# Patient Record
Sex: Female | Born: 1945
Health system: Southern US, Community
[De-identification: ages and names within clinical notes are randomized; demographics above are authoritative.]

## PROBLEM LIST (undated history)

## (undated) DIAGNOSIS — I499 Cardiac arrhythmia, unspecified: Secondary | ICD-10-CM

## (undated) DIAGNOSIS — Z9289 Personal history of other medical treatment: Secondary | ICD-10-CM

## (undated) DIAGNOSIS — I10 Essential (primary) hypertension: Secondary | ICD-10-CM

## (undated) DIAGNOSIS — Z95810 Presence of automatic (implantable) cardiac defibrillator: Secondary | ICD-10-CM

## (undated) DIAGNOSIS — I779 Disorder of arteries and arterioles, unspecified: Secondary | ICD-10-CM

## (undated) DIAGNOSIS — I219 Acute myocardial infarction, unspecified: Secondary | ICD-10-CM

## (undated) DIAGNOSIS — I2510 Atherosclerotic heart disease of native coronary artery without angina pectoris: Secondary | ICD-10-CM

## (undated) DIAGNOSIS — E7800 Pure hypercholesterolemia, unspecified: Secondary | ICD-10-CM

## (undated) DIAGNOSIS — I739 Peripheral vascular disease, unspecified: Secondary | ICD-10-CM

## (undated) DIAGNOSIS — Z720 Tobacco use: Secondary | ICD-10-CM

## (undated) HISTORY — DX: Peripheral vascular disease, unspecified: I73.9

## (undated) HISTORY — DX: Disorder of arteries and arterioles, unspecified: I77.9

## (undated) HISTORY — DX: Presence of automatic (implantable) cardiac defibrillator: Z95.810

## (undated) HISTORY — PX: PERCUTANEOUS CORONARY STENT INTERVENTION (PCI-S): SHX6016

## (undated) HISTORY — DX: Tobacco use: Z72.0

## (undated) HISTORY — DX: Personal history of other medical treatment: Z92.89

## (undated) SURGERY — COLONOSCOPY
Anesthesia: Moderate Sedation

---

## 2008-06-27 HISTORY — PX: ENDARTERECTOMY: SHX5162

## 2008-06-27 HISTORY — PX: ANGIOPLASTY / STENTING FEMORAL: SUR30

## 2009-06-27 DIAGNOSIS — Z95810 Presence of automatic (implantable) cardiac defibrillator: Secondary | ICD-10-CM

## 2009-06-27 HISTORY — DX: Presence of automatic (implantable) cardiac defibrillator: Z95.810

## 2010-04-27 HISTORY — PX: CARDIAC DEFIBRILLATOR PLACEMENT: SHX171

## 2012-04-02 DIAGNOSIS — I2510 Atherosclerotic heart disease of native coronary artery without angina pectoris: Secondary | ICD-10-CM | POA: Diagnosis not present

## 2012-04-02 DIAGNOSIS — I10 Essential (primary) hypertension: Secondary | ICD-10-CM | POA: Diagnosis not present

## 2012-04-02 DIAGNOSIS — E559 Vitamin D deficiency, unspecified: Secondary | ICD-10-CM | POA: Diagnosis not present

## 2012-04-02 DIAGNOSIS — E7800 Pure hypercholesterolemia, unspecified: Secondary | ICD-10-CM | POA: Diagnosis not present

## 2012-04-10 ENCOUNTER — Other Ambulatory Visit (HOSPITAL_COMMUNITY): Payer: Self-pay | Admitting: Internal Medicine

## 2012-04-10 DIAGNOSIS — Z1231 Encounter for screening mammogram for malignant neoplasm of breast: Secondary | ICD-10-CM

## 2012-04-11 DIAGNOSIS — I739 Peripheral vascular disease, unspecified: Secondary | ICD-10-CM | POA: Diagnosis not present

## 2012-04-11 DIAGNOSIS — I2510 Atherosclerotic heart disease of native coronary artery without angina pectoris: Secondary | ICD-10-CM | POA: Diagnosis not present

## 2012-04-11 DIAGNOSIS — I6529 Occlusion and stenosis of unspecified carotid artery: Secondary | ICD-10-CM | POA: Diagnosis not present

## 2012-04-17 ENCOUNTER — Other Ambulatory Visit: Payer: Self-pay | Admitting: Gastroenterology

## 2012-04-17 DIAGNOSIS — Z1211 Encounter for screening for malignant neoplasm of colon: Secondary | ICD-10-CM | POA: Diagnosis not present

## 2012-04-17 DIAGNOSIS — R142 Eructation: Secondary | ICD-10-CM | POA: Diagnosis not present

## 2012-04-17 DIAGNOSIS — R141 Gas pain: Secondary | ICD-10-CM | POA: Diagnosis not present

## 2012-04-18 ENCOUNTER — Ambulatory Visit (HOSPITAL_COMMUNITY): Payer: Self-pay

## 2012-04-26 DIAGNOSIS — Z9289 Personal history of other medical treatment: Secondary | ICD-10-CM

## 2012-04-26 DIAGNOSIS — I2510 Atherosclerotic heart disease of native coronary artery without angina pectoris: Secondary | ICD-10-CM | POA: Diagnosis not present

## 2012-04-26 DIAGNOSIS — I739 Peripheral vascular disease, unspecified: Secondary | ICD-10-CM | POA: Diagnosis not present

## 2012-04-26 DIAGNOSIS — I10 Essential (primary) hypertension: Secondary | ICD-10-CM | POA: Diagnosis not present

## 2012-04-26 DIAGNOSIS — E782 Mixed hyperlipidemia: Secondary | ICD-10-CM | POA: Diagnosis not present

## 2012-04-26 HISTORY — PX: TRANSTHORACIC ECHOCARDIOGRAM: SHX275

## 2012-04-26 HISTORY — DX: Personal history of other medical treatment: Z92.89

## 2012-04-27 HISTORY — PX: OTHER SURGICAL HISTORY: SHX169

## 2012-05-04 ENCOUNTER — Ambulatory Visit (HOSPITAL_COMMUNITY)
Admission: RE | Admit: 2012-05-04 | Discharge: 2012-05-04 | Disposition: A | Discharge status: Home / Self Care | Payer: Medicare Other | Source: Ambulatory Visit | Attending: Internal Medicine | Admitting: Internal Medicine

## 2012-05-04 DIAGNOSIS — Z1231 Encounter for screening mammogram for malignant neoplasm of breast: Secondary | ICD-10-CM | POA: Insufficient documentation

## 2012-05-07 DIAGNOSIS — E782 Mixed hyperlipidemia: Secondary | ICD-10-CM | POA: Diagnosis not present

## 2012-05-07 DIAGNOSIS — I70219 Atherosclerosis of native arteries of extremities with intermittent claudication, unspecified extremity: Secondary | ICD-10-CM | POA: Diagnosis not present

## 2012-05-07 DIAGNOSIS — I6529 Occlusion and stenosis of unspecified carotid artery: Secondary | ICD-10-CM | POA: Diagnosis not present

## 2012-05-28 DIAGNOSIS — Z01419 Encounter for gynecological examination (general) (routine) without abnormal findings: Secondary | ICD-10-CM | POA: Diagnosis not present

## 2012-06-11 ENCOUNTER — Ambulatory Visit (HOSPITAL_COMMUNITY)
Admission: RE | Admit: 2012-06-11 | Discharge: 2012-06-11 | Disposition: A | Discharge status: Home / Self Care | Payer: Medicare Other | Source: Ambulatory Visit | Attending: Gastroenterology | Admitting: Gastroenterology

## 2012-06-11 ENCOUNTER — Encounter (HOSPITAL_COMMUNITY): Payer: Self-pay | Admitting: *Deleted

## 2012-06-11 ENCOUNTER — Encounter (HOSPITAL_COMMUNITY)
Admission: RE | Disposition: A | Discharge status: Home / Self Care | Payer: Self-pay | Source: Ambulatory Visit | Attending: Gastroenterology

## 2012-06-11 DIAGNOSIS — E7800 Pure hypercholesterolemia, unspecified: Secondary | ICD-10-CM | POA: Diagnosis not present

## 2012-06-11 DIAGNOSIS — D126 Benign neoplasm of colon, unspecified: Secondary | ICD-10-CM | POA: Diagnosis not present

## 2012-06-11 DIAGNOSIS — Z1211 Encounter for screening for malignant neoplasm of colon: Secondary | ICD-10-CM | POA: Insufficient documentation

## 2012-06-11 DIAGNOSIS — I10 Essential (primary) hypertension: Secondary | ICD-10-CM | POA: Diagnosis not present

## 2012-06-11 DIAGNOSIS — Z79899 Other long term (current) drug therapy: Secondary | ICD-10-CM | POA: Insufficient documentation

## 2012-06-11 DIAGNOSIS — Z7982 Long term (current) use of aspirin: Secondary | ICD-10-CM | POA: Insufficient documentation

## 2012-06-11 HISTORY — DX: Pure hypercholesterolemia, unspecified: E78.00

## 2012-06-11 HISTORY — DX: Cardiac arrhythmia, unspecified: I49.9

## 2012-06-11 HISTORY — DX: Essential (primary) hypertension: I10

## 2012-06-11 HISTORY — DX: Atherosclerotic heart disease of native coronary artery without angina pectoris: I25.10

## 2012-06-11 HISTORY — PX: COLONOSCOPY: SHX5424

## 2012-06-11 SURGERY — COLONOSCOPY
Anesthesia: Moderate Sedation

## 2012-06-11 MED ORDER — FENTANYL CITRATE 0.05 MG/ML IJ SOLN
INTRAMUSCULAR | Status: DC | PRN
  Administered 2012-06-11 (×3): 25 ug via INTRAVENOUS

## 2012-06-11 MED ORDER — MIDAZOLAM HCL 10 MG/2ML IJ SOLN
INTRAMUSCULAR | Status: AC
  Filled 2012-06-11: qty 2

## 2012-06-11 MED ORDER — SODIUM CHLORIDE 0.9 % IV SOLN
INTRAVENOUS | Status: DC
  Administered 2012-06-11: 500 mL via INTRAVENOUS

## 2012-06-11 MED ORDER — FENTANYL CITRATE 0.05 MG/ML IJ SOLN
INTRAMUSCULAR | Status: AC
  Filled 2012-06-11: qty 2

## 2012-06-11 MED ORDER — MIDAZOLAM HCL 10 MG/2ML IJ SOLN
INTRAMUSCULAR | Status: DC | PRN
  Administered 2012-06-11 (×3): 2 mg via INTRAVENOUS

## 2012-06-11 NOTE — H&P (Signed)
Andrea Hubbard is an 66 y.o. female.   Chief Complaint: Colorectal cancer screening. HPI: Patient is here for colorectal cancer screening. She denies any active GI issues at this time.  Past Medical History  Diagnosis Date  . Hypertension   . Coronary artery disease   . Dysrhythmia   . Hypercholesteremia     Past Surgical History  Procedure Date  . Right femoral stent placement   . Right endartectomy   . Cardiac defibrillator placement    History reviewed. No pertinent family history. Social History:  reports that she has been smoking Cigarettes.  She has a 10 pack-year smoking history. She does not have any smokeless tobacco history on file. She reports that she does not drink alcohol or use illicit drugs.  Allergies: Not on File  Medications Prior to Admission  Medication Sig Dispense Refill  . aspirin 81 MG tablet Take 81 mg by mouth daily.      Marland Kitchen atorvastatin (LIPITOR) 40 MG tablet Take 40 mg by mouth daily.      . clopidogrel (PLAVIX) 300 MG TABS Take 75 mg by mouth once.      . metoprolol (LOPRESSOR) 50 MG tablet Take 50 mg by mouth daily.      . ramipril (ALTACE) 5 MG capsule Take 5 mg by mouth daily.        No results found for this or any previous visit (from the past 48 hour(s)). No results found.  Review of Systems  Constitutional: Negative.   HENT: Negative.   Eyes: Negative.   Respiratory: Negative.   Cardiovascular: Negative.   Gastrointestinal: Negative.   Genitourinary: Negative.   Musculoskeletal: Negative.   Skin: Negative.   Neurological: Negative.   Endo/Heme/Allergies: Negative.   Psychiatric/Behavioral: Negative.    Blood pressure 132/66, pulse 56, temperature 97.6 F (36.4 C), temperature source Oral, resp. rate 15, height 5\' 3"  (1.6 m), weight 62.596 kg (138 lb), SpO2 99.00%. Physical Exam  Constitutional: She is oriented to person, place, and time. She appears well-developed and well-nourished.  HENT:  Head: Normocephalic and atraumatic.   Eyes: Conjunctivae normal and EOM are normal. Pupils are equal, round, and reactive to light.  Neck: Normal range of motion. Neck supple.  Cardiovascular: Normal rate and regular rhythm.   Respiratory: Effort normal and breath sounds normal.  GI: Soft. Bowel sounds are normal.  Neurological: She is alert and oriented to person, place, and time.  Skin: Skin is warm and dry.  Psychiatric: She has a normal mood and affect. Her behavior is normal. Judgment and thought content normal.    Assessment/Plan Colorectal cancer screening: proceed with a colonoscopy at this time.  Andrea Hubbard 06/11/2012, 2:53 PM

## 2012-06-12 ENCOUNTER — Encounter (HOSPITAL_COMMUNITY): Payer: Self-pay | Admitting: Gastroenterology

## 2012-06-12 NOTE — Op Note (Addendum)
[facility] [address] [place], [address]   OPERATIVE PROCEDURE REPORT  PATIENT: Andrea Hubbard, Andrea Hubbard  MR#: [id number] BIRTHDATE: [birth year] GENDER: Female ENDOSCOPIST: Dr.  Lorenza Burton, MD ASSISTANT:   Beryle Beams, technician Anthony Sar, RN PROCEDURE DATE: 06/11/2012  PRE-PROCEDURE PREPARATION: Moviprep was taken as instructed (32 ounces the night prior to the procedure and 32 ounces the morning of the procedure).  The patient was fasted for four hours prior to the procedure. PRE-PROCEDURE PHYSICAL: Patient has stable vital signs.  Neck is supple.  There is no JVD, thyromegaly or LAD.  Chest clear to auscultation.  S1 and S2 regular. AICD present on chest.  Abdomen soft, non-distended, non-tender with NABS. PROCEDURE:     Colonoscopy with hot snare polypectomy x 3. ASA CLASS:     Class IV INDICATIONS:     1.  Colorectal cancer screening. MEDICATIONS:     Fentanyl 75 mcg  and Versed 2 mg IV  DESCRIPTION OF PROCEDURE: After the risks, benefits, and alternatives of the procedure were thoroughly explained [including a 10% missed rate of cancer and polyps], informed consent was obtained.  Digital rectal exam was performed.  The 110536  was introduced through the anus  and advanced to the terminal ileum which was intubated for a short distance , limited by No adverse events experienced.   The quality of the prep was good. . Multiple washes were done. Small lesions could be missed. The instrument was then slowly withdrawn as the colon was fully examined.     COLON FINDINGS: Three diminutive sessile polyps, ranging between 3-20mm in size, were found in the distal descending colon and were removed by snare polypectomy was performed using snare cautery x 3. The resection was complete and 2 the polyp tissue was completely retrieved. One was ablated by the tip of the snare. The entire colonic mucosa appeared healthy with a normal vascular pattern.  No masses,  diverticula or AVMs were noted. The appendiceal orifice and the ICV were identified and photographed.  The terminal ileum appeared normal.  Retroflexed views revealed small internal hemorrhoids. The patient tolerated the procedure without immediate complications.  The scope was then withdrawn from the patient and the procedure terminated.  TIME TO CECUM:  10 minutes 00 seconds WITHDRAW TIME:  06 minutes 00 seconds  IMPRESSION:     Three diminutive sessile polyps, ranging between 3-5 mm in size, were found in the distal descending colon; polypectomy was performed using snare cautery; otherwise, normal colonoscopy upto the terminal ileum.    RECOMMENDATIONS:     1.  Avoid all NSAIDS for the next 2 weeks. 2.  Await pathology results. 3.  Continue surveillance. 4.  High fiber diet with liberal fluid intake. 5.  OP follow-up is advised on a PRN basis.   REPEAT EXAM:      In 5 -10 years,  for a repeat colonoscopy, depending on the pathology results. If the patient has any abnormal GI symptoms in the interim, she has been advised to contact the office as soon as possible for further recommendations.   CPT CODES:     [id number], Colonoscopy with snare polypectomy   DIAGNOSIS CODES:     211.3, V76.51   REFERRED BJ:YNWGN Allyne Gee, M.D.  eSigned:  Dr. Lorenza Burton, MD 06/11/2012 5:46 PM Revised: 06/11/2012 5:46 PM  PATIENT NAME:  [name], [name] MR#: [id number]

## 2012-06-15 DIAGNOSIS — L219 Seborrheic dermatitis, unspecified: Secondary | ICD-10-CM | POA: Diagnosis not present

## 2012-06-15 DIAGNOSIS — L658 Other specified nonscarring hair loss: Secondary | ICD-10-CM | POA: Diagnosis not present

## 2012-08-29 DIAGNOSIS — E782 Mixed hyperlipidemia: Secondary | ICD-10-CM | POA: Diagnosis not present

## 2012-08-29 DIAGNOSIS — Z79899 Other long term (current) drug therapy: Secondary | ICD-10-CM | POA: Diagnosis not present

## 2012-08-29 DIAGNOSIS — I2510 Atherosclerotic heart disease of native coronary artery without angina pectoris: Secondary | ICD-10-CM | POA: Diagnosis not present

## 2012-08-29 DIAGNOSIS — R5383 Other fatigue: Secondary | ICD-10-CM | POA: Diagnosis not present

## 2012-08-29 DIAGNOSIS — IMO0001 Reserved for inherently not codable concepts without codable children: Secondary | ICD-10-CM | POA: Diagnosis not present

## 2012-08-29 DIAGNOSIS — M353 Polymyalgia rheumatica: Secondary | ICD-10-CM | POA: Diagnosis not present

## 2012-09-17 ENCOUNTER — Ambulatory Visit (HOSPITAL_COMMUNITY)
Admission: AD | Admit: 2012-09-17 | Discharge: 2012-09-17 | Disposition: A | Discharge status: Home / Self Care | Payer: Medicare Other | Source: Ambulatory Visit | Attending: Cardiovascular Disease | Admitting: Cardiovascular Disease

## 2012-09-17 DIAGNOSIS — Z7902 Long term (current) use of antithrombotics/antiplatelets: Secondary | ICD-10-CM | POA: Insufficient documentation

## 2012-09-17 DIAGNOSIS — I2510 Atherosclerotic heart disease of native coronary artery without angina pectoris: Secondary | ICD-10-CM | POA: Diagnosis not present

## 2012-09-17 LAB — PLATELET INHIBITION P2Y12: Platelet Function  P2Y12: 72 [PRU] — ABNORMAL LOW (ref 194–418)

## 2012-09-17 LAB — VITAMIN B12: Vitamin B-12: 601 pg/mL (ref 211–911)

## 2012-11-05 DIAGNOSIS — Z0000 Encounter for general adult medical examination without abnormal findings: Secondary | ICD-10-CM | POA: Diagnosis not present

## 2012-11-05 DIAGNOSIS — Z0110 Encounter for examination of ears and hearing without abnormal findings: Secondary | ICD-10-CM | POA: Diagnosis not present

## 2012-11-05 DIAGNOSIS — E559 Vitamin D deficiency, unspecified: Secondary | ICD-10-CM | POA: Diagnosis not present

## 2012-11-05 DIAGNOSIS — Z79899 Other long term (current) drug therapy: Secondary | ICD-10-CM | POA: Diagnosis not present

## 2012-11-05 DIAGNOSIS — I2510 Atherosclerotic heart disease of native coronary artery without angina pectoris: Secondary | ICD-10-CM | POA: Diagnosis not present

## 2012-11-05 DIAGNOSIS — E7800 Pure hypercholesterolemia, unspecified: Secondary | ICD-10-CM | POA: Diagnosis not present

## 2012-11-05 DIAGNOSIS — I10 Essential (primary) hypertension: Secondary | ICD-10-CM | POA: Diagnosis not present

## 2012-11-05 DIAGNOSIS — Z0100 Encounter for examination of eyes and vision without abnormal findings: Secondary | ICD-10-CM | POA: Diagnosis not present

## 2012-11-07 ENCOUNTER — Encounter: Payer: Self-pay | Admitting: Cardiovascular Disease

## 2012-12-20 ENCOUNTER — Other Ambulatory Visit: Payer: Self-pay | Admitting: Cardiovascular Disease

## 2012-12-20 NOTE — Telephone Encounter (Signed)
Rx was sent to pharmacy electronically. 

## 2013-02-07 LAB — ICD DEVICE OBSERVATION

## 2013-02-08 ENCOUNTER — Other Ambulatory Visit: Payer: Self-pay | Admitting: Cardiovascular Disease

## 2013-02-08 ENCOUNTER — Telehealth: Payer: Self-pay | Admitting: Cardiovascular Disease

## 2013-02-08 ENCOUNTER — Encounter: Payer: Self-pay | Admitting: Physician Assistant

## 2013-02-08 ENCOUNTER — Ambulatory Visit (INDEPENDENT_AMBULATORY_CARE_PROVIDER_SITE_OTHER): Payer: Medicare Other | Admitting: Physician Assistant

## 2013-02-08 VITALS — BP 110/70 | HR 59 | Ht 66.0 in | Wt 135.0 lb

## 2013-02-08 DIAGNOSIS — Z720 Tobacco use: Secondary | ICD-10-CM

## 2013-02-08 DIAGNOSIS — I739 Peripheral vascular disease, unspecified: Secondary | ICD-10-CM | POA: Diagnosis not present

## 2013-02-08 DIAGNOSIS — F17200 Nicotine dependence, unspecified, uncomplicated: Secondary | ICD-10-CM | POA: Diagnosis not present

## 2013-02-08 DIAGNOSIS — I255 Ischemic cardiomyopathy: Secondary | ICD-10-CM

## 2013-02-08 DIAGNOSIS — I2510 Atherosclerotic heart disease of native coronary artery without angina pectoris: Secondary | ICD-10-CM | POA: Diagnosis not present

## 2013-02-08 DIAGNOSIS — R42 Dizziness and giddiness: Secondary | ICD-10-CM

## 2013-02-08 DIAGNOSIS — I2589 Other forms of chronic ischemic heart disease: Secondary | ICD-10-CM

## 2013-02-08 LAB — ICD DEVICE OBSERVATION
CHARGE TIME: 8.8 s
DEV-0020ICD: NEGATIVE
PACEART VT: 0
RV LEAD AMPLITUDE: 8.5 mv
RV LEAD IMPEDENCE ICD: 459 Ohm
RV LEAD THRESHOLD: 0.7 V
TZAT-0004SLOWVT: 8
TZAT-0012SLOWVT: 200 ms
TZAT-0020SLOWVT: 1 ms
TZON-0004SLOWVT: 2.5
TZST-0001SLOWVT: 2
TZST-0003SLOWVT: 41 J
TZST-0003SLOWVT: 41 J
TZST-0003SLOWVT: 41 J
TZST-0003SLOWVT: 41 J
TZST-0003SLOWVT: 41 J

## 2013-02-08 MED ORDER — MECLIZINE HCL 32 MG PO TABS: 32.0000 mg | ORAL_TABLET | Freq: Three times a day (TID) | ORAL | Status: DC | PRN

## 2013-02-08 MED ORDER — MECLIZINE HCL 25 MG PO TABS: 32.0000 mg | ORAL_TABLET | Freq: Three times a day (TID) | ORAL | Status: DC | PRN

## 2013-02-08 NOTE — Telephone Encounter (Signed)
Taken care of

## 2013-02-08 NOTE — Assessment & Plan Note (Signed)
Several different causes of vertigo including labyrinthitis, Mnire's disease, positional, migrainous vertigo amongst others.  This patient's description her to be triggered by flashing light positional change.  We did interrogate her ICD device this showed new cardiac arrhythmia cause of her dizziness which is her main reason for coming here. I did check orthostatic vital signs and they were normal.  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 I did prescribe her some meclizine to take a when necessary basis should she have further episodes.  She will follow up with her primary care provider.

## 2013-02-08 NOTE — Telephone Encounter (Signed)
Message forwarded to B. Hager, PA-C for further instructions.  

## 2013-02-08 NOTE — Telephone Encounter (Signed)
Crystal with [facility] calling about RX written by B Hager  Has rx for meclizine 32 mg  Does not come in that milligram  Please call

## 2013-02-08 NOTE — Patient Instructions (Addendum)
Follow up with Dr.Croitoru in 3 months for your yearly ICD interrogation.   Use meclizine 3 times daily on an as-needed basis should he develop vertigo.  Follow up with Dr. Allyson Sabal as scheduled.

## 2013-02-08 NOTE — Addendum Note (Signed)
Addended by: Dwana Melena on: 02/08/2013 11:15 AM   Modules accepted: Orders

## 2013-02-08 NOTE — Progress Notes (Signed)
Date:  02/08/2013   ID:  Andrea Hubbard, DOB 04/12/1946, MRN [id number]  PCP:  Gwynneth Aliment, MD  Primary Cardiologist:  Allyson Sabal   Chief complaint: dizziness/vertigo    History of Present Illness: Andrea Hubbard is a 67 y.o. female  with history of coronary artery disease, [employer] Teligen defibrillator placement in November 2011, tobacco abuse, and still currently smokes 1/2 pack per week, psoriasis of the ear canal,  hypertension and hyperlipidemia. She apparently had a silent myocardial infarction in the winter of 2010. She also has peripheral vascular disease. She underwent right carotid endarterectomy. Her last lower extremity arterial Dopplers showed ABIs of 0.63 on the right and 0.77 on the left and that was on May 07, 2012. Apparently, she had a PCI stent of an artery in her heart but that might have been in [place] where she had moved from. Last 2D echocardiogram was April 26, 2012, showed an ejection fraction 30% to 35%. There was suggestion of impaired LV relaxation. There was moderate posterior wall hypokinesis, moderate to severe anterior wall hypokinesis and there was severe septal hypokinesis. Right ventricular systolic function was moderately reduced. Left atrium was normal in size. There was trace mitral regurgitation. Right ventricular systolic pressure was elevated at 40-50 mmHg. Mild aortic regurgitation. Her last nuclear stress test was April 26, 2012, showed moderate-to-severe perfusion defect in the basal inferior septal and basal inferior mid inferoseptal, mid inferior and apical inferior regions. This was consistent with infarct or scar. Poststress ejection fraction was 31%. There was no evidence of any meaningful reversible ischemia. Scan was considered low risk.  This P2 by 12 in March which showed platelet inhibition and 72PRU.    She presents today with complaints of dizziness she's had 4 episodes since March each about 2-3 months apart. She states that  the first episode in March after she got up from bed the room began to spin. Second and third episodes were in May/June and the patient states there was lot of bright light and sudddenly could not walk straight line.  She was alsoat a Engineer, mining walking down the corridor when she felt like the it was tilting.  The last episode was his past Wednesday her again she felt the room spinning. Most of the day she felt shaky on her feet but the feelings eventually did pass.  She reports no nausea or headache thereafter.  She also denies vomiting, fever, chest pain, shortness of breath, orthopnea, PND, cough, congestion, abdominal pain, hematochezia, melena, lower extremity edema, claudication.  Wt Readings from Last 3 Encounters:  02/08/13 135 lb (61.236 kg)  06/11/12 138 lb (62.596 kg)  06/11/12 138 lb (62.596 kg)     Past Medical History  Diagnosis Date  . Hypertension   . Coronary artery disease   . Dysrhythmia   . Hypercholesteremia     Current Outpatient Prescriptions  Medication Sig Dispense Refill  . aspirin EC 81 MG tablet Take 81 mg by mouth daily.      Marland Kitchen atorvastatin (LIPITOR) 40 MG tablet TAKE 1 TABLET DAILY AT BEDTIME.  30 tablet  9  . clopidogrel (PLAVIX) 75 MG tablet Take 75 mg by mouth daily.      . metoprolol (LOPRESSOR) 50 MG tablet Take 50 mg by mouth daily.      . ramipril (ALTACE) 5 MG capsule Take 5 mg by mouth daily.      . meclizine (ANTIVERT) 32 MG tablet Take 1 tablet (32 mg total) by mouth  3 (three) times daily as needed.  30 tablet  0   No current facility-administered medications for this visit.    Allergies:   No Known Allergies  Social History:  The patient  reports that she has been smoking Cigarettes.  She has a 10 pack-year smoking history. She does not have any smokeless tobacco history on file. She reports that she does not drink alcohol or use illicit drugs.   Family history:  No family history on file.  ROS:  Please see the history of present illness.   All other systems reviewed and negative.   PHYSICAL EXAM: VS:  BP 110/70  Pulse 59  Ht 5\' 6"  (1.676 m)  Wt 135 lb (61.236 kg)  BMI 21.8 kg/m2 Well nourished, well developed, in no acute distress HEENT: Pupils are equal round react to light accommodation extraocular movements are intact.  Neck: no JVDNo cervical lymphadenopathy.  Left carotid bruit Cardiac: Regular rate and rhythm without murmurs rubs or gallops. Lungs:  clear to auscultation bilaterally, no wheezing, rhonchi or rales Ext: no lower extremity edema.  2+ radial and dorsalis pedis pulses. Skin: warm and dry Neuro:  Grossly normal, strength 5 out of 5 equal upper and lower extremities.  EKG:   Sinus bradycardia Q waves inferiorly and in the anterior.  ASSESSMENT AND PLAN:  Problem List Items Addressed This Visit   Vertigo     Several different causes of vertigo including labyrinthitis, Mnire's disease, positional, migrainous vertigo amongst others.  This patient's description her to be triggered by flashing light positional change.  We did interrogate her ICD device this showed new cardiac arrhythmia cause of her dizziness which is her main reason for coming here. I did check orthostatic vital signs and they were normal.  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 I did prescribe her some meclizine to take a when necessary basis should she have further episodes.  She will follow up with her primary care provider.    Tobacco abuse   PAD (peripheral artery disease),    Relevant Medications      aspirin EC 81 MG tablet   Cardiomyopathy, ischemic, ejection fraction 30-35%.  [employer] Present[employer], broadcasting AICD   Relevant Medications      aspirin EC 81 MG tablet   CAD (coronary artery disease) - Primary   Relevant Medications      aspirin EC 81 MG tablet   Other Relevant Orders      EKG  12-Lead

## 2013-02-08 NOTE — Progress Notes (Signed)
In office ICD interrogation. Normal device function. No changes made this session. 

## 2013-02-21 ENCOUNTER — Encounter: Payer: Self-pay | Admitting: *Deleted

## 2013-02-22 ENCOUNTER — Other Ambulatory Visit: Payer: Self-pay | Admitting: *Deleted

## 2013-02-22 DIAGNOSIS — I739 Peripheral vascular disease, unspecified: Secondary | ICD-10-CM

## 2013-02-27 ENCOUNTER — Encounter (HOSPITAL_COMMUNITY): Payer: Self-pay | Admitting: *Deleted

## 2013-02-27 ENCOUNTER — Ambulatory Visit: Payer: BC Managed Care – PPO | Admitting: Cardiovascular Disease

## 2013-02-27 ENCOUNTER — Telehealth (HOSPITAL_COMMUNITY): Payer: Self-pay | Admitting: Cardiovascular Disease

## 2013-03-22 ENCOUNTER — Ambulatory Visit (HOSPITAL_COMMUNITY)
Admission: RE | Admit: 2013-03-22 | Discharge: 2013-03-22 | Disposition: A | Discharge status: Home / Self Care | Payer: Medicare Other | Source: Ambulatory Visit | Attending: Cardiovascular Disease | Admitting: Cardiovascular Disease

## 2013-03-22 DIAGNOSIS — I739 Peripheral vascular disease, unspecified: Secondary | ICD-10-CM | POA: Insufficient documentation

## 2013-03-22 NOTE — Progress Notes (Signed)
Lower Extremity Arterial Duplex Completed. °Brianna L Mazza,RVT °

## 2013-04-05 ENCOUNTER — Telehealth: Payer: Self-pay | Admitting: *Deleted

## 2013-04-05 ENCOUNTER — Encounter: Payer: Self-pay | Admitting: *Deleted

## 2013-04-05 DIAGNOSIS — I739 Peripheral vascular disease, unspecified: Secondary | ICD-10-CM

## 2013-04-05 NOTE — Telephone Encounter (Signed)
Message copied by Marella Bile on Fri Apr 05, 2013  5:17 PM ------      Message from: Runell Gess      Created: Mon Apr 01, 2013  2:08 PM       No change from prior study. Repeat in 6 months ------

## 2013-04-05 NOTE — Telephone Encounter (Signed)
Order placed for repeat lower ext doppler in 6 months 

## 2013-05-06 DIAGNOSIS — I2510 Atherosclerotic heart disease of native coronary artery without angina pectoris: Secondary | ICD-10-CM | POA: Diagnosis not present

## 2013-05-06 DIAGNOSIS — E7800 Pure hypercholesterolemia, unspecified: Secondary | ICD-10-CM | POA: Diagnosis not present

## 2013-05-06 DIAGNOSIS — Z006 Encounter for examination for normal comparison and control in clinical research program: Secondary | ICD-10-CM | POA: Diagnosis not present

## 2013-05-06 DIAGNOSIS — I10 Essential (primary) hypertension: Secondary | ICD-10-CM | POA: Diagnosis not present

## 2013-05-06 DIAGNOSIS — Z7902 Long term (current) use of antithrombotics/antiplatelets: Secondary | ICD-10-CM | POA: Diagnosis not present

## 2013-05-06 DIAGNOSIS — Z79899 Other long term (current) drug therapy: Secondary | ICD-10-CM | POA: Diagnosis not present

## 2013-05-06 DIAGNOSIS — Z0000 Encounter for general adult medical examination without abnormal findings: Secondary | ICD-10-CM | POA: Diagnosis not present

## 2013-05-07 DIAGNOSIS — Z23 Encounter for immunization: Secondary | ICD-10-CM | POA: Diagnosis not present

## 2013-05-08 ENCOUNTER — Other Ambulatory Visit (HOSPITAL_COMMUNITY): Payer: Self-pay | Admitting: Cardiovascular Disease

## 2013-05-14 ENCOUNTER — Encounter: Payer: Medicare Other | Admitting: Cardiovascular Disease

## 2013-05-14 ENCOUNTER — Ambulatory Visit: Payer: Medicare Other | Admitting: Cardiovascular Disease

## 2013-05-21 ENCOUNTER — Ambulatory Visit (HOSPITAL_COMMUNITY)
Admission: RE | Admit: 2013-05-21 | Discharge: 2013-05-21 | Disposition: A | Discharge status: Home / Self Care | Payer: Medicare Other | Source: Ambulatory Visit | Attending: Cardiovascular Disease | Admitting: Cardiovascular Disease

## 2013-05-21 DIAGNOSIS — I6529 Occlusion and stenosis of unspecified carotid artery: Secondary | ICD-10-CM | POA: Insufficient documentation

## 2013-05-21 NOTE — Progress Notes (Signed)
Carotid Duplex Completed. Athony Coppa, BS, RDMS, RVT  

## 2013-05-27 ENCOUNTER — Telehealth: Payer: Self-pay | Admitting: *Deleted

## 2013-05-27 DIAGNOSIS — I6523 Occlusion and stenosis of bilateral carotid arteries: Secondary | ICD-10-CM

## 2013-05-27 NOTE — Progress Notes (Signed)
[facility] for carotid doppler results 50-69% left side. Repeat in 6 months.

## 2013-05-27 NOTE — Telephone Encounter (Signed)
Carotid doppler results given to patient.  Voiced understanding the study needs to be repeated in 6 months.

## 2013-05-27 NOTE — Telephone Encounter (Signed)
Message copied by Vita Barley on Mon May 27, 2013  8:40 AM ------      Message from: Thurmon Fair      Created: Wed May 22, 2013  2:52 PM       Left carotid blockage has progressed substantially, but not yet in surgical range. Repeat in 6 months please ------

## 2013-05-28 DIAGNOSIS — H6120 Impacted cerumen, unspecified ear: Secondary | ICD-10-CM | POA: Diagnosis not present

## 2013-05-28 DIAGNOSIS — H902 Conductive hearing loss, unspecified: Secondary | ICD-10-CM | POA: Diagnosis not present

## 2013-05-29 LAB — ICD DEVICE OBSERVATION

## 2013-05-30 ENCOUNTER — Ambulatory Visit: Payer: Medicare Other | Admitting: Cardiovascular Disease

## 2013-06-03 ENCOUNTER — Other Ambulatory Visit: Payer: Self-pay | Admitting: Cardiovascular Disease

## 2013-06-03 NOTE — Telephone Encounter (Signed)
Rx was sent to pharmacy electronically. 

## 2013-06-11 DIAGNOSIS — L299 Pruritus, unspecified: Secondary | ICD-10-CM | POA: Diagnosis not present

## 2013-06-18 ENCOUNTER — Encounter: Payer: Self-pay | Admitting: Cardiovascular Disease

## 2013-06-18 ENCOUNTER — Ambulatory Visit (INDEPENDENT_AMBULATORY_CARE_PROVIDER_SITE_OTHER): Payer: Medicare Other | Admitting: Cardiovascular Disease

## 2013-06-18 VITALS — BP 100/60 | HR 68 | Resp 16 | Ht 66.0 in | Wt 134.2 lb

## 2013-06-18 DIAGNOSIS — I255 Ischemic cardiomyopathy: Secondary | ICD-10-CM

## 2013-06-18 DIAGNOSIS — Z95810 Presence of automatic (implantable) cardiac defibrillator: Secondary | ICD-10-CM

## 2013-06-18 DIAGNOSIS — I2589 Other forms of chronic ischemic heart disease: Secondary | ICD-10-CM | POA: Diagnosis not present

## 2013-06-18 DIAGNOSIS — R42 Dizziness and giddiness: Secondary | ICD-10-CM

## 2013-06-18 DIAGNOSIS — I2510 Atherosclerotic heart disease of native coronary artery without angina pectoris: Secondary | ICD-10-CM

## 2013-06-18 NOTE — Patient Instructions (Signed)
Your physician recommends that you schedule a follow-up appointment in: ONE YEAR DEVICE CHECK & APPOINTMENT WITH DR Royann Shivers  NEED TO DOWNLOAD PACEMAKER ON September 18 2013

## 2013-06-19 LAB — MDC_IDC_ENUM_SESS_TYPE_INCLINIC
Brady Statistic RV Percent Paced: 1 % — CL
Lead Channel Impedance Value: 428 Ohm
Lead Channel Setting Pacing Amplitude: 2.5 V
Zone Setting Detection Interval: 292.6 ms
Zone Setting Detection Interval: 324.3 ms

## 2013-06-21 NOTE — Progress Notes (Signed)
Patient ID: Aiva Miskell, female   DOB: Oct 17, 1945, 67 y.o.   MRN: [id number]      Reason for office visit Defibrillator followup, ischemic cardio myopathy, coronary artery disease  Mrs. Mencer returns for routine followup. She is followed by Dr. Nanetta Batty for coronary disease, ischemic cardiomyopathy, carotid Stenosis and peripheral arterial disease of the lower extremities. She has moderate to severely depressed left ventricular ejection fraction at about 30%. She received a single-chamber defibrillator for primary prevention in November of 2011. She's never received a defibrillator shock.  She has been performing in office defibrillator checks on her every 6 months or so basis. Her coronary risk factors are generally well addressed. She is on aspirin and clopidogrel as well as beta blockers and ACE inhibitors and a statin. She does not have problems with clinically evident congestive heart failure and does not require loop diuretic therapy.  She is asymptomatic from a cardiovascular standpoint and describes an unusual type of vertigo. When she is confronted with a very bright light the visual landscape in front of her begins to tilt sideways. This was particularly evident when she was in the passenger seat of a car at night looking into oncoming headlights. Has also happened her walking down a corridor with very bright linoleum reflecting the lights from overhead.   Allergies  Allergen Reactions  . Morphine And Related     Current Outpatient Prescriptions  Medication Sig Dispense Refill  . aspirin EC 81 MG tablet Take 81 mg by mouth daily.      Marland Kitchen atorvastatin (LIPITOR) 40 MG tablet TAKE 1 TABLET DAILY AT BEDTIME.  30 tablet  9  . clopidogrel (PLAVIX) 75 MG tablet TAKE 1 TABLET BY MOUTH EVERY DAY  90 tablet  1  . meclizine (ANTIVERT) 25 MG tablet Take 1.5 tablets (37.5 mg total) by mouth 3 (three) times daily as needed.  30 tablet  0  . metoprolol (LOPRESSOR) 50 MG tablet Take 50 mg by  mouth daily.      . ramipril (ALTACE) 5 MG capsule Take 5 mg by mouth daily.       No current facility-administered medications for this visit.    Past Medical History  Diagnosis Date  . Hypertension   . Coronary artery disease   . Dysrhythmia   . Hypercholesteremia   . Cardiac defibrillator in place 2011    [employer]   . Tobacco abuse   . MI (myocardial infarction) 2010    "silent"  . PVD (peripheral vascular disease)   . History of nuclear stress test 04/26/2012    mod-severe perfusion defect in basal inferior septal and basal inf, mid inferoseptal, mid inferio & apical inferior - consistent with infarct/scar; low risk     Past Surgical History  Procedure Laterality Date  . Right femoral stent placement    . Right endartectomy    . Cardiac defibrillator placement  04/2010  . Colonoscopy  06/11/2012    Procedure: COLONOSCOPY;  Surgeon: Charna Elizabeth, MD;  Location: [facility];  Service: Endoscopy;  Laterality: N/A;  . Lower extremity arterial doppler  04/2012    ABI 0.63 on R and 0.77 on L  . Percutaneous coronary stent intervention (pci-s)      in [place]  . Transthoracic echocardiogram  04/26/2012    EF 30-35%; imparied LV relaxation; mod post wall hypokinesis, mod-severe ant wall kypokinesis and severe septal hypokinesis; RV systolic function mod redcued, with RV systolic pressure elevated; mild AVR  . Carotid duplex  04/2012    R ECA - severe vessel narrowing with inc velocities 70-99% diameter reduction; L subclav - elevated velocities 50-69% diameter reduction; L vertebral - occlusive disease; L bulb/prox ICA - elevated velocities in prox & mid segments on ICA, 50-69% diameter reduction     Family History  Problem Relation Age of Onset  . Leukemia Mother   . Pancreatic cancer Father   . Diabetes Maternal Grandmother   . Sudden death Maternal Grandfather   . Stroke Paternal Grandfather   . Rheum arthritis Sister   . Hypertension Son     History    Social History  . Marital Status: Married    Spouse Name: N/A    Number of Children: 3  . Years of Education: coolege    Occupational History  . 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 Smoking status: Current Every Day Smoker -- 0.50 packs/day for 25 years    Types: Cigarettes  . Smokeless tobacco: Not on file  . Alcohol Use: No  . Drug Use: No  . Sexual Activity: Not on file   Other Topics Concern  . Not on file   Social History Narrative  . No narrative on file    Review of systems: The patient specifically denies any chest pain at rest or with exertion, dyspnea at rest or with exertion, orthopnea, paroxysmal nocturnal dyspnea, syncope, palpitations, focal neurological deficits, intermittent claudication, lower extremity edema, unexplained weight gain, cough, hemoptysis or wheezing.  The patient also denies abdominal pain, nausea, vomiting, dysphagia, diarrhea, constipation, polyuria, polydipsia, dysuria, hematuria, frequency, urgency, abnormal bleeding or bruising, fever, chills, unexpected weight changes, mood swings, change in skin or hair texture, change in voice quality, auditory or visual problems, allergic reactions or rashes, new musculoskeletal complaints other than usual "aches and pains".   PHYSICAL EXAM BP 100/60  Pulse 68  Resp 16  Ht 5\' 6"  (1.676 m)  Wt 134 lb 3.2 oz (60.873 kg)  BMI 21.67 kg/m2  General: Alert, oriented x3, no distress Head: no evidence of trauma, PERRL, EOMI, no exophtalmos or lid lag, no myxedema, no xanthelasma; normal ears, nose and oropharynx. She has psoriasis in her ear canals Neck: normal jugular venous pulsations and no hepatojugular reflux; brisk carotid pulses without delay and no carotid bruits Chest: clear to auscultation, no signs of consolidation by percussion or palpation, normal fremitus, symmetrical and full respiratory excursions; healthy defibrillator site Cardiovascular: normal position  and quality of the apical impulse, regular rhythm, normal first and second heart sounds, no murmurs, rubs or gallops Abdomen: no tenderness or distention, no masses by palpation, no abnormal pulsatility or arterial bruits, normal bowel sounds, no hepatosplenomegaly Extremities: no clubbing, cyanosis or edema; 2+ radial, ulnar and brachial pulses bilaterally; 2+ right femoral, posterior tibial and dorsalis pedis pulses; 2+ left femoral, posterior tibial and dorsalis pedis pulses; no subclavian or femoral bruits Neurological: grossly nonfocal; no nystagmus   EKG: Normal sinus rhythm, old inferior infarction, possible old anteroseptal infarction, lateral T wave inversion, chronic  Lipid Panel  Total cholesterol 188, triglycerides 90, HDL 51, LDL particle #1488, small LDL 611, LDL size 21  BMET Normal electrolytes, creatinine 0.83, glucose 85, normal liver function test   ASSESSMENT AND PLAN Cardiomyopathy, ischemic, ejection fraction 30-35%.  [employer] broadcasting AICD ICD check in clinic. Normal device function. Threshold and sensing consistent with previous device measurements. Impedance trends stable over time. No evidence of any ventricular arrhythmias. Histogram distribution appropriate for  patient and level of activity. No changes made this session. Device programmed at appropriate safety margins. Device programmed to optimize intrinsic conduction. Estimated longevity 10.5 years. Plan to follow up with the device clinic in 3 months    CAD (coronary artery disease) Asymptomatic. Last nuclear stress test October 2013 showed a large inferior wall scar and a smaller apical scar with an ejection fraction of 31% by echo her EF was likewise 30-35% with evidence of wall motion abnormalities in both the right coronary artery and LAD artery distribution.  Vertigo Although her blood pressure is borderline low, her pattern of vertigo does not really fit with hypotension or  arrhythmia.    Patient Instructions  Your physician recommends that you schedule a follow-up appointment in: ONE YEAR DEVICE CHECK & APPOINTMENT WITH DR Royann Shivers  NEED TO DOWNLOAD PACEMAKER ON September 18 2013     Orders Placed This Encounter  Procedures  . Implantable device check  Melizza Kanode  Thurmon Fair, MD, [facility] [phone] office [phone] pager

## 2013-06-21 NOTE — Assessment & Plan Note (Signed)
Asymptomatic. Last nuclear stress test October 2013 showed a large inferior wall scar and a smaller apical scar with an ejection fraction of 31% by echo her EF was likewise 30-35% with evidence of wall motion abnormalities in both the right coronary artery and LAD artery distribution.

## 2013-06-21 NOTE — Assessment & Plan Note (Signed)
Although her blood pressure is borderline low, her pattern of vertigo does not really fit with hypotension or arrhythmia.

## 2013-06-21 NOTE — Assessment & Plan Note (Signed)
ICD check in clinic. Normal device function. Threshold and sensing consistent with previous device measurements. Impedance trends stable over time. No evidence of any ventricular arrhythmias. Histogram distribution appropriate for patient and level of activity. No changes made this session. Device programmed at appropriate safety margins. Device programmed to optimize intrinsic conduction. Estimated longevity 10.5 years. Plan to follow up with the device clinic in 3 months

## 2013-09-12 ENCOUNTER — Encounter: Payer: Self-pay | Admitting: *Deleted

## 2013-10-14 ENCOUNTER — Other Ambulatory Visit: Payer: Self-pay | Admitting: *Deleted

## 2013-10-14 MED ORDER — ATORVASTATIN CALCIUM 40 MG PO TABS: 40.0000 mg | ORAL_TABLET | Freq: Every day | ORAL | Status: DC

## 2013-10-14 NOTE — Telephone Encounter (Signed)
Rx refill sent to patient pharmacy   

## 2013-11-12 ENCOUNTER — Ambulatory Visit (HOSPITAL_COMMUNITY)
Admission: RE | Admit: 2013-11-12 | Discharge: 2013-11-12 | Disposition: A | Discharge status: Home / Self Care | Payer: Medicare Other | Source: Ambulatory Visit | Attending: Internal Medicine | Admitting: Internal Medicine

## 2013-11-12 ENCOUNTER — Encounter (HOSPITAL_COMMUNITY): Payer: Medicare Other

## 2013-11-12 DIAGNOSIS — I739 Peripheral vascular disease, unspecified: Secondary | ICD-10-CM | POA: Diagnosis not present

## 2013-11-12 DIAGNOSIS — I6523 Occlusion and stenosis of bilateral carotid arteries: Secondary | ICD-10-CM

## 2013-11-12 NOTE — Progress Notes (Signed)
Lower Extremity Arterial Duplex Completed. °Brianna L Mazza,RVT °

## 2013-11-12 NOTE — Progress Notes (Signed)
Carotid Duplex Completed. °Brianna L Mazza,RVT °

## 2013-11-20 ENCOUNTER — Telehealth: Payer: Self-pay | Admitting: *Deleted

## 2013-11-20 DIAGNOSIS — I6529 Occlusion and stenosis of unspecified carotid artery: Secondary | ICD-10-CM

## 2013-11-20 NOTE — Telephone Encounter (Signed)
Message copied by Tressa Busman on Wed Nov 20, 2013  8:28 AM ------      Message from: Sanda Klein      Created: Wed Nov 20, 2013  8:04 AM       No real change in internal carotids. Some worsening in external carotid artery narrowing (not nearly as worrisome as ICA). Repeat in 6 months ------

## 2013-11-20 NOTE — Telephone Encounter (Signed)
Carotid doppler results discussed with patient per Dr. Loletha Grayer.  Order placed for recheck in 6 months.  Patient voiced understanding.

## 2013-11-26 ENCOUNTER — Ambulatory Visit (INDEPENDENT_AMBULATORY_CARE_PROVIDER_SITE_OTHER): Payer: Medicare Other | Admitting: Cardiovascular Disease

## 2013-11-26 ENCOUNTER — Encounter: Payer: Self-pay | Admitting: Cardiovascular Disease

## 2013-11-26 VITALS — BP 171/80 | HR 61 | Ht 66.0 in | Wt 138.2 lb

## 2013-11-26 DIAGNOSIS — I6529 Occlusion and stenosis of unspecified carotid artery: Secondary | ICD-10-CM | POA: Diagnosis not present

## 2013-11-26 DIAGNOSIS — D689 Coagulation defect, unspecified: Secondary | ICD-10-CM | POA: Diagnosis not present

## 2013-11-26 DIAGNOSIS — Z79899 Other long term (current) drug therapy: Secondary | ICD-10-CM

## 2013-11-26 DIAGNOSIS — Z01818 Encounter for other preprocedural examination: Secondary | ICD-10-CM

## 2013-11-26 DIAGNOSIS — E785 Hyperlipidemia, unspecified: Secondary | ICD-10-CM

## 2013-11-26 DIAGNOSIS — I739 Peripheral vascular disease, unspecified: Secondary | ICD-10-CM

## 2013-11-26 DIAGNOSIS — R5383 Other fatigue: Secondary | ICD-10-CM

## 2013-11-26 DIAGNOSIS — R5381 Other malaise: Secondary | ICD-10-CM | POA: Diagnosis not present

## 2013-11-26 DIAGNOSIS — I10 Essential (primary) hypertension: Secondary | ICD-10-CM | POA: Insufficient documentation

## 2013-11-26 NOTE — Progress Notes (Signed)
11/26/2013 Andrea Hubbard   08/22/1945  [id number]  Primary Physician Maximino Greenland, MD Primary Cardiologist: Lorretta Harp MD Renae Gloss  HPI:  The patient is a very pleasant 68 year old thin appearing married [place] American female, mother of 3 children who recently moved from [place] 2 months ago to retire in [place]. Her daughter went to [employer]. 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  Her cardiac risk factor profile is positive for a 20+ pack-year history of tobacco abuse, currently smoking one-half pack per week. She has treated hypertension and hyperlipidemia. There is no family history for heart disease. She apparently had a "silent myocardial infarction" in the winter of 2010. Her cardiovascular evaluation began because of symptoms of claudication. She ultimately underwent right carotid endarterectomy for asymptomatic carotid disease, a PCI stent of an artery in her heart, and placement of a prophylactic ICD as well as stenting of her right lower extremity. She denies chest pain or shortness of breath . She does continue to smoke.  She has been followed up with Dr. [name] for her ICD. Recent Dopplers performed of her carotids reveal a widely patent right internal carotid artery endarterectomy site with moderate disease in her left internal carotid artery that has remained stable. She has had some progression of disease in her lower extremities with gradual onset of right lower extremity claudication.    Current Outpatient Prescriptions  Medication Sig Dispense Refill  . aspirin EC 81 MG tablet Take 81 mg by mouth daily.      Marland Kitchen atorvastatin (LIPITOR) 40 MG tablet Take 1 tablet (40 mg total) by mouth daily.  30 tablet  9  . clopidogrel (PLAVIX) 75 MG tablet TAKE 1 TABLET BY MOUTH EVERY DAY  90 tablet  1  . meclizine (ANTIVERT) 25 MG tablet Take 1.5 tablets (37.5 mg total) by mouth 3 (three) times daily as needed.  30 tablet  0   . metoprolol (LOPRESSOR) 50 MG tablet Take 50 mg by mouth daily.      . ramipril (ALTACE) 5 MG capsule Take 5 mg by mouth daily.       No current facility-administered medications for this visit.    Allergies  Allergen Reactions  . Morphine And Related     History   Social History  . Marital Status: Married    Spouse Name: N/A    Number of Children: 3  . Years of Education: coolege    Occupational History  . 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 Smoking status: Current Some Day Smoker -- 0.30 packs/day for 25 years    Types: Cigarettes  . Smokeless tobacco: Never Used  . Alcohol Use: No  . Drug Use: No  . Sexual Activity: Not on file   Other Topics Concern  . Not on file   Social History Narrative  . No narrative on file     Review of Systems: General: negative for chills, fever, night sweats or weight changes.  Cardiovascular: negative for chest pain, dyspnea on exertion, edema, orthopnea, palpitations, paroxysmal nocturnal dyspnea or shortness of breath Dermatological: negative for rash Respiratory: negative for cough or wheezing Urologic: negative for hematuria Abdominal: negative for nausea, vomiting, diarrhea, bright red blood per rectum, melena, or hematemesis Neurologic: negative for visual changes, syncope, or dizziness All other systems reviewed and are otherwise negative except as noted above.    Blood pressure 171/80, pulse 61, height  5\' 6"  (1.676 m), weight 138 lb 3.2 oz (62.687 kg).  General appearance: alert and no distress Neck: no adenopathy, no JVD, supple, symmetrical, trachea midline, thyroid not enlarged, symmetric, no tenderness/mass/nodules and soft bilateral carotid bruits Lungs: clear to auscultation bilaterally Heart: regular rate and rhythm, S1, S2 normal, no murmur, click, rub or gallop Extremities: bilateral femoral bruits, absent right pedal pulses, 1+ left pedal pulse  EKG not performed  today  ASSESSMENT AND PLAN:   Essential hypertension Elevated today on her current medications which she attributes to rushing to the office today  Hyperlipidemia On statin therapy followed by her PCP  PAD (peripheral artery disease),  History of right carotid endarterectomy with carotid Dopplers that reveals to be widely patent with moderate left ICA stenosis which has remained stable. She also has significant peripheral vascular occlusive disease in her lower extremities and has had stenting on her left leg in the past. When I saw her 2 years ago she denied claudication but currently she has become symptomatic on the right side. Lower extremity arterial Doppler studies performed 11/12/13 revealed right ABI of 0.69 with an occluded right SFA that is an old finding however she's had progression of disease in her right external iliac artery. Her left ABI is 0.7 with what appears to be progression of disease within the mid left SFA. Based on this, I am going to arrange for her to undergo angiography and potential endovascular therapy for lifestyle limiting claudication.      Lorretta Harp MD FACP,FACC,FAHA, [facility] 11/26/2013 10:10 AM

## 2013-11-26 NOTE — Assessment & Plan Note (Signed)
Elevated today on her current medications which she attributes to rushing to the office today

## 2013-11-26 NOTE — Assessment & Plan Note (Signed)
On statin therapy followed by her PCP 

## 2013-11-26 NOTE — Patient Instructions (Signed)
Dr. Gwenlyn Found has ordered a peripheral angiogram to be done at [facility].  This procedure is going to look at the bloodflow in your lower extremities.  If Dr. Gwenlyn Found is able to open up the arteries, you will have to spend one night in the hospital.  If he is not able to open the arteries, you will be able to go home that same day.    After the procedure, you will not be allowed to drive for 3 days or push, pull, or lift anything greater than 10 lbs for one week.    You will be required to have bloodwork and a chest xray prior to your procedure.  Our scheduler will advise you on when these items need to be done.   Please have this done at the [facility]   Reps - Leisure centre manager Left groin access

## 2013-11-26 NOTE — Assessment & Plan Note (Signed)
History of right carotid endarterectomy with carotid Dopplers that reveals to be widely patent with moderate left ICA stenosis which has remained stable. She also has significant peripheral vascular occlusive disease in her lower extremities and has had stenting on her left leg in the past. When I saw her 2 years ago she denied claudication but currently she has become symptomatic on the right side. Lower extremity arterial Doppler studies performed 11/12/13 revealed right ABI of 0.69 with an occluded right SFA that is an old finding however she's had progression of disease in her right external iliac artery. Her left ABI is 0.7 with what appears to be progression of disease within the mid left SFA. Based on this, I am going to arrange for her to undergo angiography and potential endovascular therapy for lifestyle limiting claudication.

## 2013-12-02 ENCOUNTER — Telehealth: Payer: Self-pay | Admitting: Cardiovascular Disease

## 2013-12-02 NOTE — Telephone Encounter (Signed)
No issues to have dental work. She has an ICD which Dr. Loletha Grayer follows

## 2013-12-02 NOTE — Telephone Encounter (Signed)
Dr Gwenlyn Found, please advise.  She is scheduled for PV angio on 7/6

## 2013-12-02 NOTE — Telephone Encounter (Signed)
Need clarence for 2 extraction and full mouth scaling.Also need the type of pacemaker pt have.Please  Fax this to [phone]

## 2013-12-02 NOTE — Telephone Encounter (Signed)
FORWARD TO Dr Gwenlyn Found / Livia Snellen  will defer for answer

## 2013-12-04 ENCOUNTER — Telehealth: Payer: Self-pay | Admitting: Cardiovascular Disease

## 2013-12-04 NOTE — Telephone Encounter (Signed)
I spoke with patient regarding the time change for her procedure on Monday 12/30/13---She is to arrive at Short Stay at [facility] at 7:00 am for a 9:00 am procedure.  She voiced her understanding.   Lm for Isac Caddy @ 9:50 am regarding time change.   Spoke with Abbe @ 9:52 am regarding case time.

## 2013-12-05 NOTE — Telephone Encounter (Signed)
Clearance letter for dental work drafted and a copy of Dr Lurline Del 05/2013 office note faxed detailing ICD info.

## 2013-12-09 DIAGNOSIS — I10 Essential (primary) hypertension: Secondary | ICD-10-CM | POA: Diagnosis not present

## 2013-12-09 DIAGNOSIS — Z0000 Encounter for general adult medical examination without abnormal findings: Secondary | ICD-10-CM | POA: Diagnosis not present

## 2013-12-09 DIAGNOSIS — Z23 Encounter for immunization: Secondary | ICD-10-CM | POA: Diagnosis not present

## 2013-12-09 DIAGNOSIS — E559 Vitamin D deficiency, unspecified: Secondary | ICD-10-CM | POA: Diagnosis not present

## 2013-12-09 DIAGNOSIS — R413 Other amnesia: Secondary | ICD-10-CM | POA: Diagnosis not present

## 2013-12-09 DIAGNOSIS — E7800 Pure hypercholesterolemia, unspecified: Secondary | ICD-10-CM | POA: Diagnosis not present

## 2013-12-09 DIAGNOSIS — Z7901 Long term (current) use of anticoagulants: Secondary | ICD-10-CM | POA: Diagnosis not present

## 2013-12-09 DIAGNOSIS — R946 Abnormal results of thyroid function studies: Secondary | ICD-10-CM | POA: Diagnosis not present

## 2013-12-09 DIAGNOSIS — I739 Peripheral vascular disease, unspecified: Secondary | ICD-10-CM | POA: Diagnosis not present

## 2013-12-09 DIAGNOSIS — I2510 Atherosclerotic heart disease of native coronary artery without angina pectoris: Secondary | ICD-10-CM | POA: Diagnosis not present

## 2013-12-10 ENCOUNTER — Telehealth: Payer: Self-pay | Admitting: Cardiovascular Disease

## 2013-12-10 NOTE — Telephone Encounter (Signed)
Pt need extractions and need a clarence for that,since pt is on plavix. Previous fax just said she was cleared for dental work. Its been over 30 yrs since she been to the dentist,she is going to have other procedures,all which will cause a lot of bleeding. Would you please fax this today if possible. [phone].

## 2013-12-10 NOTE — Telephone Encounter (Signed)
Note faxed for holding plavix

## 2013-12-20 ENCOUNTER — Encounter (HOSPITAL_COMMUNITY): Payer: Self-pay

## 2013-12-23 ENCOUNTER — Ambulatory Visit
Admission: RE | Admit: 2013-12-23 | Discharge: 2013-12-23 | Disposition: A | Discharge status: Home / Self Care | Payer: Medicare Other | Source: Ambulatory Visit | Attending: Cardiovascular Disease | Admitting: Cardiovascular Disease

## 2013-12-23 ENCOUNTER — Other Ambulatory Visit: Payer: Self-pay | Admitting: *Deleted

## 2013-12-23 DIAGNOSIS — Z79899 Other long term (current) drug therapy: Secondary | ICD-10-CM | POA: Diagnosis not present

## 2013-12-23 DIAGNOSIS — D689 Coagulation defect, unspecified: Secondary | ICD-10-CM | POA: Diagnosis not present

## 2013-12-23 DIAGNOSIS — Z01818 Encounter for other preprocedural examination: Secondary | ICD-10-CM | POA: Diagnosis not present

## 2013-12-23 DIAGNOSIS — R5381 Other malaise: Secondary | ICD-10-CM | POA: Diagnosis not present

## 2013-12-23 DIAGNOSIS — R5383 Other fatigue: Secondary | ICD-10-CM | POA: Diagnosis not present

## 2013-12-23 LAB — CBC
HCT: 46 % (ref 36.0–46.0)
HEMOGLOBIN: 15.3 g/dL — AB (ref 12.0–15.0)
MCH: 28.1 pg (ref 26.0–34.0)
MCHC: 33.3 g/dL (ref 30.0–36.0)
MCV: 84.4 fL (ref 78.0–100.0)
Platelets: 172 10*3/uL (ref 150–400)
RBC: 5.45 MIL/uL — AB (ref 3.87–5.11)
RDW: 15.2 % (ref 11.5–15.5)
WBC: 9.1 10*3/uL (ref 4.0–10.5)

## 2013-12-23 MED ORDER — CLOPIDOGREL BISULFATE 75 MG PO TABS: 75.0000 mg | ORAL_TABLET | Freq: Every day | ORAL | Status: DC

## 2013-12-23 NOTE — Telephone Encounter (Signed)
Rx refill sent to patient pharmacy   

## 2013-12-24 LAB — BASIC METABOLIC PANEL
BUN: 15 mg/dL (ref 6–23)
CALCIUM: 9.5 mg/dL (ref 8.4–10.5)
CO2: 24 meq/L (ref 19–32)
Chloride: 104 mEq/L (ref 96–112)
Creat: 0.76 mg/dL (ref 0.50–1.10)
GLUCOSE: 102 mg/dL — AB (ref 70–99)
POTASSIUM: 4.6 meq/L (ref 3.5–5.3)
Sodium: 140 mEq/L (ref 135–145)

## 2013-12-24 LAB — TSH: TSH: 4.901 u[IU]/mL — ABNORMAL HIGH (ref 0.350–4.500)

## 2013-12-24 LAB — APTT: APTT: 32 s (ref 24–37)

## 2013-12-24 LAB — PROTIME-INR
INR: 1.02 (ref <1.50)
PROTHROMBIN TIME: 13.4 s (ref 11.6–15.2)

## 2013-12-30 ENCOUNTER — Encounter: Payer: Self-pay | Admitting: *Deleted

## 2013-12-30 ENCOUNTER — Encounter (HOSPITAL_COMMUNITY)
Admission: RE | Disposition: A | Discharge status: Home / Self Care | Payer: Self-pay | Source: Ambulatory Visit | Attending: Cardiovascular Disease

## 2013-12-30 ENCOUNTER — Ambulatory Visit (HOSPITAL_COMMUNITY)
Admission: RE | Admit: 2013-12-30 | Discharge: 2013-12-31 | Disposition: A | Discharge status: Home / Self Care | Payer: Medicare Other | Source: Ambulatory Visit | Attending: Cardiovascular Disease | Admitting: Cardiovascular Disease

## 2013-12-30 ENCOUNTER — Encounter (HOSPITAL_COMMUNITY): Payer: Self-pay | Admitting: General Practice

## 2013-12-30 DIAGNOSIS — F17200 Nicotine dependence, unspecified, uncomplicated: Secondary | ICD-10-CM | POA: Insufficient documentation

## 2013-12-30 DIAGNOSIS — I70219 Atherosclerosis of native arteries of extremities with intermittent claudication, unspecified extremity: Secondary | ICD-10-CM | POA: Diagnosis not present

## 2013-12-30 DIAGNOSIS — Z7982 Long term (current) use of aspirin: Secondary | ICD-10-CM | POA: Diagnosis not present

## 2013-12-30 DIAGNOSIS — I255 Ischemic cardiomyopathy: Secondary | ICD-10-CM

## 2013-12-30 DIAGNOSIS — E785 Hyperlipidemia, unspecified: Secondary | ICD-10-CM | POA: Diagnosis not present

## 2013-12-30 DIAGNOSIS — I252 Old myocardial infarction: Secondary | ICD-10-CM | POA: Diagnosis not present

## 2013-12-30 DIAGNOSIS — I739 Peripheral vascular disease, unspecified: Secondary | ICD-10-CM | POA: Diagnosis present

## 2013-12-30 DIAGNOSIS — Z720 Tobacco use: Secondary | ICD-10-CM

## 2013-12-30 DIAGNOSIS — I701 Atherosclerosis of renal artery: Secondary | ICD-10-CM | POA: Diagnosis not present

## 2013-12-30 DIAGNOSIS — Z95810 Presence of automatic (implantable) cardiac defibrillator: Secondary | ICD-10-CM | POA: Insufficient documentation

## 2013-12-30 DIAGNOSIS — Z9889 Other specified postprocedural states: Secondary | ICD-10-CM | POA: Diagnosis not present

## 2013-12-30 DIAGNOSIS — I700 Atherosclerosis of aorta: Secondary | ICD-10-CM | POA: Insufficient documentation

## 2013-12-30 DIAGNOSIS — Z9861 Coronary angioplasty status: Secondary | ICD-10-CM | POA: Diagnosis not present

## 2013-12-30 DIAGNOSIS — I10 Essential (primary) hypertension: Secondary | ICD-10-CM | POA: Diagnosis not present

## 2013-12-30 DIAGNOSIS — I2510 Atherosclerotic heart disease of native coronary artery without angina pectoris: Secondary | ICD-10-CM | POA: Diagnosis not present

## 2013-12-30 HISTORY — PX: ILIAC ARTERY STENT: SHX1786

## 2013-12-30 HISTORY — DX: Presence of automatic (implantable) cardiac defibrillator: Z95.810

## 2013-12-30 HISTORY — PX: LOWER EXTREMITY ANGIOGRAM: SHX5508

## 2013-12-30 HISTORY — DX: Acute myocardial infarction, unspecified: I21.9

## 2013-12-30 LAB — POCT ACTIVATED CLOTTING TIME
ACTIVATED CLOTTING TIME: 242 s
Activated Clotting Time: 259 seconds
Activated Clotting Time: 315 seconds
Activated Clotting Time: 157 seconds

## 2013-12-30 SURGERY — ANGIOGRAM, LOWER EXTREMITY
Anesthesia: LOCAL | Laterality: Left

## 2013-12-30 MED ORDER — METOPROLOL SUCCINATE ER 50 MG PO TB24
50.0000 mg | ORAL_TABLET | Freq: Every day | ORAL | Status: DC
  Filled 2013-12-30: qty 1

## 2013-12-30 MED ORDER — SODIUM CHLORIDE 0.9 % IV SOLN: INTRAVENOUS | Status: DC

## 2013-12-30 MED ORDER — LIDOCAINE HCL (PF) 1 % IJ SOLN
INTRAMUSCULAR | Status: AC
  Filled 2013-12-30: qty 30

## 2013-12-30 MED ORDER — ASPIRIN EC 325 MG PO TBEC
325.0000 mg | DELAYED_RELEASE_TABLET | Freq: Every day | ORAL | Status: DC
  Filled 2013-12-30: qty 1

## 2013-12-30 MED ORDER — CLOPIDOGREL BISULFATE 75 MG PO TABS: 75.0000 mg | ORAL_TABLET | Freq: Every day | ORAL | Status: DC

## 2013-12-30 MED ORDER — ONDANSETRON HCL 4 MG/2ML IJ SOLN
4.0000 mg | Freq: Four times a day (QID) | INTRAMUSCULAR | Status: DC | PRN
Start: 2013-12-30 — End: 2013-12-31
  Filled 2013-12-30: qty 2

## 2013-12-30 MED ORDER — RAMIPRIL 5 MG PO CAPS
5.0000 mg | ORAL_CAPSULE | Freq: Every day | ORAL | Status: DC
Start: 2013-12-31 — End: 2013-12-31
  Filled 2013-12-30: qty 1

## 2013-12-30 MED ORDER — NITROGLYCERIN 0.2 MG/ML ON CALL CATH LAB
INTRAVENOUS | Status: AC
  Filled 2013-12-30: qty 1

## 2013-12-30 MED ORDER — ATORVASTATIN CALCIUM 40 MG PO TABS
40.0000 mg | ORAL_TABLET | Freq: Every day | ORAL | Status: DC
  Filled 2013-12-30: qty 1

## 2013-12-30 MED ORDER — SODIUM CHLORIDE 0.9 % IJ SOLN: 3.0000 mL | INTRAMUSCULAR | Status: DC | PRN

## 2013-12-30 MED ORDER — CLOPIDOGREL BISULFATE 75 MG PO TABS
75.0000 mg | ORAL_TABLET | Freq: Every day | ORAL | Status: DC
  Filled 2013-12-30: qty 1

## 2013-12-30 MED ORDER — ASPIRIN 81 MG PO CHEW: 81.0000 mg | CHEWABLE_TABLET | ORAL | Status: DC

## 2013-12-30 MED ORDER — HYDROMORPHONE HCL PF 1 MG/ML IJ SOLN
0.5000 mg | INTRAMUSCULAR | Status: DC | PRN
  Administered 2013-12-30: 0.5 mg via INTRAVENOUS
  Filled 2013-12-30: qty 1

## 2013-12-30 MED ORDER — HYDRALAZINE HCL 20 MG/ML IJ SOLN
INTRAMUSCULAR | Status: AC
  Filled 2013-12-30: qty 1

## 2013-12-30 MED ORDER — HYDRALAZINE HCL 20 MG/ML IJ SOLN
10.0000 mg | INTRAMUSCULAR | Status: DC | PRN
  Administered 2013-12-30: 16:00:00 10 mg via INTRAVENOUS
  Filled 2013-12-30: qty 1

## 2013-12-30 MED ORDER — SODIUM CHLORIDE 0.9 % IV SOLN: INTRAVENOUS | Status: AC

## 2013-12-30 MED ORDER — HEPARIN SODIUM (PORCINE) 1000 UNIT/ML IJ SOLN
INTRAMUSCULAR | Status: AC
  Filled 2013-12-30: qty 1

## 2013-12-30 MED ORDER — ASPIRIN EC 81 MG PO TBEC: 81.0000 mg | DELAYED_RELEASE_TABLET | Freq: Every day | ORAL | Status: DC

## 2013-12-30 MED ORDER — MORPHINE SULFATE 2 MG/ML IJ SOLN: 1.0000 mg | INTRAMUSCULAR | Status: DC | PRN

## 2013-12-30 MED ORDER — FENTANYL CITRATE 0.05 MG/ML IJ SOLN
INTRAMUSCULAR | Status: AC
  Filled 2013-12-30: qty 2

## 2013-12-30 MED ORDER — MIDAZOLAM HCL 2 MG/2ML IJ SOLN
INTRAMUSCULAR | Status: AC
  Filled 2013-12-30: qty 2

## 2013-12-30 MED ORDER — HEPARIN (PORCINE) IN NACL 2-0.9 UNIT/ML-% IJ SOLN
INTRAMUSCULAR | Status: AC
  Filled 2013-12-30: qty 1000

## 2013-12-30 MED ORDER — ACETAMINOPHEN 325 MG PO TABS
650.0000 mg | ORAL_TABLET | ORAL | Status: DC | PRN
  Administered 2013-12-30 – 2013-12-31 (×2): 650 mg via ORAL
  Filled 2013-12-30 (×2): qty 2

## 2013-12-30 NOTE — Care Management Note (Addendum)
    Page 1 of 1   12/31/2013     4:06:40 PM CARE MANAGEMENT NOTE 12/31/2013  Patient:  Andrea Hubbard, Andrea Hubbard   Account Number:  [id number]  Date Initiated:  12/31/2013  Documentation initiated by:  Mariann Laster  Subjective/Objective Assessment:   claudication     Action/Plan:   CM to follow for disposition needs   Anticipated DC Date:  12/31/2013   Anticipated DC Plan:  HOME/SELF CARE         Choice offered to / List presented to:             Status of service:  Completed, signed off Medicare Important Message given?   (If response is "NO", the following Medicare IM given date fields will be blank) Date Medicare IM given:   Medicare IM given by:   Date Additional Medicare IM given:   Additional Medicare IM given by:    Discharge Disposition:  HOME/SELF CARE  Per UR Regulation:    If discussed at Long Length of Stay Meetings, dates discussed:    Comments:  Crystal Hutchinson RN, BSN, MSHL, CCM  Nurse - Case Manager, (Unit [phone]  12/30/2013 Med Review:  Plavix

## 2013-12-30 NOTE — Progress Notes (Signed)
Site area: right groin  Site Prior to Removal:  Level 0   Pressure Applied For 50 MINUTES    Minutes Beginning at 1715  Manual:   Yes.    Patient Status During Pull:  stable  Post Pull Groin Site:  Level 0  Post Pull Instructions Given:  Yes.    Post Pull Pulses Present:  Yes.    Dressing Applied:  Yes.    Comments:  Pt required additional hold time due to oozing which ceased after additional pressure held.

## 2013-12-30 NOTE — H&P (Signed)
11/26/2013  Andrea Hubbard  07/31/1945  [id number]  Primary Physician Maximino Greenland, MD  Primary Cardiologist: Lorretta Harp MD Renae Gloss  HPI: The patient is a very pleasant 68 year old thin appearing married [place] American female, mother of 3 children who recently moved from [place] 2 months ago to retire in [place]. Her daughter went to [employer]. 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  Her cardiac risk factor profile is positive for a 20+ pack-year history of tobacco abuse, currently smoking one-half pack per week. She has treated hypertension and hyperlipidemia. There is no family history for heart disease. She apparently had a "silent myocardial infarction" in the winter of 2010. Her cardiovascular evaluation began because of symptoms of claudication. She ultimately underwent right carotid endarterectomy for asymptomatic carotid disease, a PCI stent of an artery in her heart, and placement of a prophylactic ICD as well as stenting of her right lower extremity. She denies chest pain or shortness of breath . She does continue to smoke.  She has been followed up with Dr. [name] for her ICD. Recent Dopplers performed of her carotids reveal a widely patent right internal carotid artery endarterectomy site with moderate disease in her left internal carotid artery that has remained stable. She has had some progression of disease in her lower extremities with gradual onset of right lower extremity claudication.  Current Outpatient Prescriptions   Medication  Sig  Dispense  Refill   .  aspirin EC 81 MG tablet  Take 81 mg by mouth daily.     Marland Kitchen  atorvastatin (LIPITOR) 40 MG tablet  Take 1 tablet (40 mg total) by mouth daily.  30 tablet  9   .  clopidogrel (PLAVIX) 75 MG tablet  TAKE 1 TABLET BY MOUTH EVERY DAY  90 tablet  1   .  meclizine (ANTIVERT) 25 MG tablet  Take 1.5 tablets (37.5 mg total) by mouth 3 (three) times daily as needed.  30 tablet   0   .  metoprolol (LOPRESSOR) 50 MG tablet  Take 50 mg by mouth daily.     .  ramipril (ALTACE) 5 MG capsule  Take 5 mg by mouth daily.      No current facility-administered medications for this visit.    Allergies   Allergen  Reactions   .  Morphine And Related     History    Social History   .  Marital Status:  Married     Spouse Name:  N/A     Number of Children:  3   .  Years of Education:  coolege    Occupational History   .  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  Smoking status:  Current Some Day Smoker -- 0.30 packs/day for 25 years     Types:  Cigarettes   .  Smokeless tobacco:  Never Used   .  Alcohol Use:  No   .  Drug Use:  No   .  Sexual Activity:  Not on file    Other Topics  Concern   .  Not on file    Social History Narrative   .  No narrative on file    Review of Systems:  General: negative for chills, fever, night sweats or weight changes.  Cardiovascular: negative for chest pain, dyspnea on exertion, edema, orthopnea, palpitations, paroxysmal nocturnal dyspnea or shortness of breath  Dermatological:  negative for rash  Respiratory: negative for cough or wheezing  Urologic: negative for hematuria  Abdominal: negative for nausea, vomiting, diarrhea, bright red blood per rectum, melena, or hematemesis  Neurologic: negative for visual changes, syncope, or dizziness  All other systems reviewed and are otherwise negative except as noted above.  Blood pressure 171/80, pulse 61, height 5\' 6"  (1.676 m), weight 138 lb 3.2 oz (62.687 kg).  General appearance: alert and no distress  Neck: no adenopathy, no JVD, supple, symmetrical, trachea midline, thyroid not enlarged, symmetric, no tenderness/mass/nodules and soft bilateral carotid bruits  Lungs: clear to auscultation bilaterally  Heart: regular rate and rhythm, S1, S2 normal, no murmur, click, rub or gallop  Extremities: bilateral femoral bruits, absent right pedal pulses, 1+  left pedal pulse  EKG not performed today  ASSESSMENT AND PLAN:  Essential hypertension  Elevated today on her current medications which she attributes to rushing to the office today  Hyperlipidemia  On statin therapy followed by her PCP  PAD (peripheral artery disease),  History of right carotid endarterectomy with carotid Dopplers that reveals to be widely patent with moderate left ICA stenosis which has remained stable. She also has significant peripheral vascular occlusive disease in her lower extremities and has had stenting on her left leg in the past. When I saw her 2 years ago she denied claudication but currently she has become symptomatic on the right side. Lower extremity arterial Doppler studies performed 11/12/13 revealed right ABI of 0.69 with an occluded right SFA that is an old finding however she's had progression of disease in her right external iliac artery. Her left ABI is 0.7 with what appears to be progression of disease within the mid left SFA. Based on this, I am going to arrange for her to undergo angiography and potential endovascular therapy for lifestyle limiting claudication.   Lorretta Harp MD FACP,FACC,FAHA, [facility]  11/26/2013  10:10 AM   Pt was reexamined and existing H & P reviewed. No changes found.  Lorretta Harp, MD [facility] 12/30/2013 7:15 AM

## 2013-12-30 NOTE — CV Procedure (Signed)
Andrea Hubbard is a 68 y.o. female    [id number] LOCATION:  FACILITY: [facility]  PHYSICIAN: Quay Burow, M.D. 1945-08-01   DATE OF PROCEDURE:  12/30/2013  DATE OF DISCHARGE:     PV Angiogram/Intervention    History obtained from chart review.The patient is a very pleasant 68 year old thin appearing married [place] American female, mother of 3 children who recently moved from [place] 2 months ago to retire in [place]. Her daughter went to [employer]. 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  Her cardiac risk factor profile is positive for a 20+ pack-year history of tobacco abuse, currently smoking one-half pack per week. She has treated hypertension and hyperlipidemia. There is no family history for heart disease. She apparently had a "silent myocardial infarction" in the winter of 2010. Her cardiovascular evaluation began because of symptoms of claudication. She ultimately underwent right carotid endarterectomy for asymptomatic carotid disease, a PCI stent of an artery in her heart, and placement of a prophylactic ICD as well as stenting of her right lower extremity. She denies chest pain or shortness of breath . She does continue to smoke.  She has been followed up with Dr. [name] for her ICD. Recent Dopplers performed of her carotids reveal a widely patent right internal carotid artery endarterectomy site with moderate disease in her left internal carotid artery that has remained stable. She has had some progression of disease in her lower extremities with gradual onset of right  Greater then left lower extremity claudication. She presents now for angiography and potential intervention.    PROCEDURE DESCRIPTION:   The patient was brought to the second floor [facility] cath lab in the postabsorptive state. She was premedicated with Valium 5 mg by mouth, IV Versed and fentanyl. Her right groinwas prepped and shaved in usual sterile fashion. Xylocaine  1% was used for local anesthesia. A 5 French sheath was inserted into the right common femoral artery using standard Seldinger technique. A 5 French pigtail catheter was placed in the midstream abdominal aorta. Abdominal aortography, bilateral iliac angiography and bifemoral runoff were performed using bolus chase digitals with traction settable technique. Visipaque dye was used for the entirety of the case. Retrograde aortic pressure was monitored during the case.   HEMODYNAMICS:    AO SYSTOLIC/AO DIASTOLIC: 564/33   Angiographic Data:   1: Abdominal aortogram-there was a 50-60% left renal artery stenosis. The infrarenal abdominal aorta was diffusely atherosclerotic.  2: Left lower extremity-80% focal left common iliac artery stenosis in the proximal edge of the previously placed stent. There was diffuse 60% segmental stenosis just proximal to this as well as diffuse 60% ostial iliac stenosis. There is diffuse 70% segmental proximal left SFA stenosis with a 90% focal mid left SFA. There was two-vessel runoff with an occluded anterior tibial.  3: Right lower extremity-long 70% stenosis in the distal right common iliac and prior right external iliac. There was a long 80-90% segmental proximal right SFA stenosis followed by total occlusion in the midportion. The distal SFA reconstituted by profunda femoris collaterals and the abductor canal and there appear to be three-vessel runoff.  IMPRESSION:[name] has diffuse aortoiliac disease as well as SFA disease. We will proceed with intervention of her right and left iliac arteries as well as potentially her mid left SFA.   Procedure Description:The patient received a total of 5000 units of heparin intravenously with ACT of 242 by the end of the case. A total of 296 cc  of contrast was administered to the patient. The short 5 French sheath was exchanged over a 0.35 wire for a 7 [place] multipurpose crossover sheath. The distal right common iliac artery  as well as the entire right external iliac artery were stented with nitinol self-expanding stents (7 x 100 mm and the external, 7 x 40 mm in the common iliac). The entire stented segment was postdilated later with a 6 mm x 4 cm balloon. There was a contained dissection within the stented segment. I did obtain contralateral access with a 5 [place] crossover catheter, 0.35 Glidewire and Rosen wire. I advanced the sheath over the bifurcation and placed a Nitinol self expanding stent in the left common iliac artery overlapping the previously placed stent postdilated with a 6 mm x 4 cm balloon resulting in reduction of 80% focal mid left SFA stenosis to 0% residual. I then placed a [place] core wire down the SFA across the mid left left SFA lesion and dilated with a 3 mm x 4 cm chocolate balloon resulting in reduction of an 80-90% focal mid left SFA stenosis to less than 30% without dissection. There appear to be some staining in the proximal left common iliac and angiography there revealed a dissection within the stented segment and extended just proximal to the stented segment but did not compromise blood flow. Completion angiography was performed with a 5 French pigtail catheter in the distal common aorta revealing patent stents with excellent flow.   Final Impression:  Successful bilateral common iliac stenting as well as right external iliac stenting and angiography of left SFA. Patient has very friable/fragile iliac arteries which resulted in linear dissection using undersized balloons. I believe the dissection planes were contained. I elected to stop the procedure without delivering a drug-eluting balloon to the mid left SFA. The sheath was was asked exchange over a 0.35 wire for a short 7 [place] sheath. The patient did receive hydralazine for blood pressure as well as additional Versed and fentanyl. She left the Cath Lab in stable condition. The sheath will be removed once the ACT falls below 170 pressure will  be held. She'll be hydrated overnight, to do with the length of therapy and discharged home in the morning. She will be followup lower extremity arterial Doppler studies in our [place] office within one to 2 weeks after which she will see me back in followup.    Lorretta Harp MD, [facility] 12/30/2013 1:18 PM

## 2013-12-31 ENCOUNTER — Other Ambulatory Visit: Payer: Self-pay | Admitting: Cardiology

## 2013-12-31 DIAGNOSIS — I739 Peripheral vascular disease, unspecified: Secondary | ICD-10-CM

## 2013-12-31 DIAGNOSIS — F17200 Nicotine dependence, unspecified, uncomplicated: Secondary | ICD-10-CM

## 2013-12-31 DIAGNOSIS — I10 Essential (primary) hypertension: Secondary | ICD-10-CM | POA: Diagnosis not present

## 2013-12-31 DIAGNOSIS — I2589 Other forms of chronic ischemic heart disease: Secondary | ICD-10-CM

## 2013-12-31 LAB — BASIC METABOLIC PANEL
Anion gap: 15 (ref 5–15)
BUN: 13 mg/dL (ref 6–23)
CO2: 22 meq/L (ref 19–32)
Calcium: 8.7 mg/dL (ref 8.4–10.5)
Chloride: 103 mEq/L (ref 96–112)
Creatinine, Ser: 0.75 mg/dL (ref 0.50–1.10)
GFR calc Af Amer: >90 mL/min (ref >90)
GFR, EST NON AFRICAN AMERICAN: 86 mL/min — AB (ref >90)
Glucose, Bld: 85 mg/dL (ref 70–99)
POTASSIUM: 3.8 meq/L (ref 3.7–5.3)
SODIUM: 140 meq/L (ref 137–147)

## 2013-12-31 LAB — CBC
HEMATOCRIT: 39.7 % (ref 36.0–46.0)
Hemoglobin: 12.9 g/dL (ref 12.0–15.0)
MCH: 27.6 pg (ref 26.0–34.0)
MCHC: 32.5 g/dL (ref 30.0–36.0)
MCV: 84.8 fL (ref 78.0–100.0)
Platelets: 149 10*3/uL — ABNORMAL LOW (ref 150–400)
RBC: 4.68 MIL/uL (ref 3.87–5.11)
RDW: 15.6 % — ABNORMAL HIGH (ref 11.5–15.5)
WBC: 9.9 10*3/uL (ref 4.0–10.5)

## 2013-12-31 NOTE — Progress Notes (Signed)
The right groin cath site is okay and the lower extremities are warm. Plan home today with f/u with Dr. Gwenlyn Found in 2 weeks.

## 2013-12-31 NOTE — Progress Notes (Signed)
Patient Profile: 68 y/o AAF w/ h/o 20+ pack-year history of continued tobacco abuse, treated hypertension and hyperlipidemia, "silent myocardial infarction" in 2010, ICM w/ EF of 30-35% s/p ICD, bilateral carotid artery disease s/p right carotid endaraectomy and lower extremity PVD, admitted for PV angiogram with plans for potential intervention, in the setting of lifestyle limiting claudication.   Subjective: No major complaints. Her right groin is mildly sore but she denies pain.   Objective: Vital signs in last 24 hours: Temp:  [97.7 F (36.5 C)-99 F (37.2 C)] 99 F (37.2 C) (07/07 0547) Pulse Rate:  [61-93] 70 (07/07 0547) Resp:  [18-20] 20 (07/07 0547) BP: (103-190)/(37-84) 144/49 mmHg (07/07 0547) SpO2:  [94 %-100 %] 94 % (07/07 0547) Weight:  [135 lb (61.236 kg)-136 lb 3.9 oz (61.8 kg)] 136 lb 3.9 oz (61.8 kg) (07/07 0005) Last BM Date: 12/29/13  Intake/Output from previous day: 07/06 0701 - 07/07 0700 In: 1357.5 [P.O.:120; I.V.:1237.5] Out: 600 [Urine:600] Intake/Output this shift: Total I/O In: 825 [I.V.:825] Out: 300 [Urine:300]  Medications Current Facility-Administered Medications  Medication Dose Route Frequency Provider Last Rate Last Dose  . acetaminophen (TYLENOL) tablet 650 mg  650 mg Oral Q4H PRN Lorretta Harp, MD   650 mg at 12/31/13 0017  . aspirin EC tablet 325 mg  325 mg Oral Daily Lorretta Harp, MD      . atorvastatin (LIPITOR) tablet 40 mg  40 mg Oral Daily Lorretta Harp, MD      . clopidogrel (PLAVIX) tablet 75 mg  75 mg Oral Daily Lorretta Harp, MD      . hydrALAZINE (APRESOLINE) injection 10 mg  10 mg Intravenous Q4H PRN Lorretta Harp, MD   10 mg at 12/30/13 1537  . HYDROmorphone (DILAUDID) injection 0.5 mg  0.5 mg Intravenous Q2H PRN Almyra Deforest, PA   0.5 mg at 12/30/13 1702  . metoprolol succinate (TOPROL-XL) 24 hr tablet 50 mg  50 mg Oral Daily Lorretta Harp, MD      . ondansetron [facility]) injection 4 mg  4 mg Intravenous Q6H PRN  Lorretta Harp, MD      . ramipril (ALTACE) capsule 5 mg  5 mg Oral Daily Lorretta Harp, MD        PE: General appearance: alert, cooperative and no distress Neck: no JVD and bilateral carotid bruits Lungs: clear to auscultation bilaterally Heart: regular rate and rhythm and 2/6 SM best heard at LUSB Extremities: no LEE Pulses: 2+ and symmetric Skin: warm and dry Neurologic: Grossly normal  Lab Results:   Recent Labs  12/31/13 0515  WBC 9.9  HGB 12.9  HCT 39.7  PLT 149*   BMET  Recent Labs  12/31/13 0515  NA 140  K 3.8  CL 103  CO2 22  GLUCOSE 85  BUN 13  CREATININE 0.75  CALCIUM 8.7   PT/INR No results found for this basename: LABPROT, INR,  in the last 72 hours Cholesterol No results found for this basename: CHOL,  in the last 72 hours Cardiac Enzymes No components found with this basename: TROPONIN,  CKMB,   Studies/Results:  PV [place] 12/30/13  HEMODYNAMICS:  AO SYSTOLIC/AO DIASTOLIC: 676/19  Angiographic Data:  1: Abdominal aortogram-there was a 50-60% left renal artery stenosis. The infrarenal abdominal aorta was diffusely atherosclerotic.  2: Left lower extremity-80% focal left common iliac artery stenosis in the proximal edge of the previously placed stent. There was diffuse 60% segmental stenosis just proximal to this as  well as diffuse 60% ostial iliac stenosis. There is diffuse 70% segmental proximal left SFA stenosis with a 90% focal mid left SFA. There was two-vessel runoff with an occluded anterior tibial.  3: Right lower extremity-long 70% stenosis in the distal right common iliac and prior right external iliac. There was a long 80-90% segmental proximal right SFA stenosis followed by total occlusion in the midportion. The distal SFA reconstituted by profunda femoris collaterals and the abductor canal and there appear to be three-vessel runoff.  Assessment/Plan  Principal Problem:   PAD (peripheral artery disease),  Active Problems:    Claudication  1. PVD w/ Claudication: Day 1 s/p successful bilateral common iliac stenting as well as right external iliac stenting and angiography of left SFA. Patient has very friable/fragile iliac arteries which resulted in linear dissection using undersized balloons. Dr. Gwenlyn Found elected to stop the procedure without delivering a drug-eluting balloon to the mid left SFA. Right groin is stable. Palpable bilateral distal pluses. BP stable. BMP pending. Plan to discharge home today after patient ambulates. Will arrane Op LEAs in 1-2 weeks with f/u with Dr. Gwenlyn Found.     LOS: 1 day    Andrea Hubbard 12/31/2013 6:31 AM

## 2013-12-31 NOTE — Discharge Summary (Signed)
Physician Discharge Summary  Patient ID: Andrea Hubbard MRN: [id number] DOB/AGE: Nov 22, 1945 68 y.o.  Admit date: 12/30/2013 Discharge date: 12/31/2013  Admission Diagnoses: PAD with Claudication  Discharge Diagnoses:  Principal Problem:   PAD (peripheral artery disease),  Active Problems:   Claudication   Discharged Condition: stable  Hospital Course: The patient is a 68 year old African American female, followed by Dr. Gwenlyn Found, with 20+ pack-year history of continued tobacco abuse, history of treated hypertension and hyperlipidemia, "silent myocardial infarction" in 2010, ICM w/ EF of 30-35% s/p ICD, bilateral carotid artery disease s/p right carotid endaraectomy and lower extremity PVD, admitted for PV angiogram with plans for potential intervention, in the setting of lifestyle limiting claudication. The procedure was performed by Dr. Gwenlyn Found on 12/30/2013 via the right femoral artery. She was noted to have diffuse aortoiliac disease as well as SFA disease (see complete angiographic data listed below). She underwent successful bilateral common iliac stenting as well as right external iliac stenting. She was noted to have very friable/fragile iliac arteries, which resulted in linear dissection using undersized balloons. Thus, Dr. Gwenlyn Found elected to stop the procedure without delivering a drug-eluting balloon to the mid left SFA. She otherwise tolerated the procedure well. She left the Cath Lab in stable condition. She was kept overnight for observation as as well as hydration. She had no post-cath issues. The right femoral access site remained stable. She denied anterior groin, flank or low back pain. She had palpable 2+ distal pulses bilaterally. She had no pain with ambulation. Her renal function remained stable. She was continued on dual antiplatelet therapy with aspirin plus Plavix. She was last seen and examined by Dr. Tamala Julian who determined that she was stable for discharge home. She has been scheduled  to undergo bilateral lower extremity arterial Dopplers as an outpatient. She is scheduled for followup with Lyda Jester, PA-C, on 01/15/2014.  Dr. Gwenlyn Found will also be in the office that day and will be available to reassess the patient at that time.   Consults: None  Significant Diagnostic Studies:   PV angios 12/30/2013  Treatments: See Hospital Course  Discharge Exam: Blood pressure 162/59, pulse 70, temperature 98.5 F (36.9 C), temperature source Oral, resp. rate 20, height 5\' 6"  (1.676 m), weight 136 lb 3.9 oz (61.8 kg), SpO2 96.00%.   Disposition: 01-Home or Self Care      Discharge Instructions   Diet - low sodium heart healthy    Complete by:  As directed      Discharge instructions    Complete by:  As directed   You will need to get a ultrasound done on both legs. This will be at Dr. Kennon Holter office. Our office will call you with appointment.     Driving Restrictions    Complete by:  As directed   No driving for 3 days     Increase activity slowly    Complete by:  As directed      Lifting restrictions    Complete by:  As directed   No lifting for more than 1/2 gallon of milk for 3 days            Medication List         aspirin EC 81 MG tablet  Take 81 mg by mouth daily.     atorvastatin 40 MG tablet  Commonly known as:  LIPITOR  Take 1 tablet (40 mg total) by mouth daily.     clopidogrel 75 MG tablet  Commonly known as:  PLAVIX  Take 1 tablet (75 mg total) by mouth daily.     metoprolol succinate 50 MG 24 hr tablet  Commonly known as:  TOPROL-XL  Take 50 mg by mouth daily. Take with or immediately following a meal.     ramipril 5 MG capsule  Commonly known as:  ALTACE  Take 5 mg by mouth daily.       Follow-up Information   Follow up with Lyda Jester, PA-C On 01/15/2014. ( 10:30 am with Dr. Kennon Holter PA )    Specialty:  Cardiology   Contact information:   [address] [place] [address] [phone]        Follow up with [facility]. (our office will call you with an appointment to get ultrasound of your legs at Dr. Kennon Holter office)    Specialty:  Cardiology   Contact information:   [place] [address] [place]: >30 MINUTES  Signed: Lyda Jester 12/31/2013, 10:26 AM

## 2014-01-01 ENCOUNTER — Telehealth (HOSPITAL_COMMUNITY): Payer: Self-pay | Admitting: *Deleted

## 2014-01-01 NOTE — Discharge Summary (Signed)
She has no complications since procedure on yesterday. The right groin is unremarkable. Can be discharged today with f/u in 1-2 weeks with Quay Burow.

## 2014-01-14 ENCOUNTER — Ambulatory Visit (HOSPITAL_COMMUNITY)
Admission: RE | Admit: 2014-01-14 | Discharge: 2014-01-14 | Disposition: A | Discharge status: Home / Self Care | Payer: Medicare Other | Source: Ambulatory Visit | Attending: Cardiovascular Disease | Admitting: Cardiovascular Disease

## 2014-01-14 DIAGNOSIS — I70219 Atherosclerosis of native arteries of extremities with intermittent claudication, unspecified extremity: Secondary | ICD-10-CM | POA: Diagnosis not present

## 2014-01-14 DIAGNOSIS — I739 Peripheral vascular disease, unspecified: Secondary | ICD-10-CM | POA: Diagnosis not present

## 2014-01-14 NOTE — Progress Notes (Signed)
Arterial Duplex Lower Ext. Completed. Byanka Landrus, BS, RDMS, RVT  

## 2014-01-15 ENCOUNTER — Ambulatory Visit (INDEPENDENT_AMBULATORY_CARE_PROVIDER_SITE_OTHER): Payer: Medicare Other | Admitting: Cardiology

## 2014-01-15 VITALS — BP 139/73 | HR 69 | Ht 66.0 in | Wt 136.0 lb

## 2014-01-15 DIAGNOSIS — I739 Peripheral vascular disease, unspecified: Secondary | ICD-10-CM

## 2014-01-15 DIAGNOSIS — E785 Hyperlipidemia, unspecified: Secondary | ICD-10-CM

## 2014-01-15 DIAGNOSIS — I6529 Occlusion and stenosis of unspecified carotid artery: Secondary | ICD-10-CM | POA: Diagnosis not present

## 2014-01-15 NOTE — Progress Notes (Signed)
Patient ID: Andrea Hubbard, female   DOB: August 22, 1945, 68 y.o.   MRN: [id number]    01/15/2014 Andrea Hubbard   [birth year]  [id number]  Primary Physicia Maximino Greenland, MD Primary Cardiologist: Dr. Gwenlyn Found  [name] presents to clinic today for post hospital followup. Details regarding her history and recent hospital course are outlined in detail below.  HPI:  The patient is a 68 year old African American female, followed by Dr. Gwenlyn Found, with 20+ pack-year history of continued tobacco abuse, history of treated hypertension and hyperlipidemia, "silent myocardial infarction" in 2010, ICM w/ EF of 30-35% s/p ICD, bilateral carotid artery disease s/p right carotid endaraectomy and lower extremity PVD, admitted for PV angiogram with plans for potential intervention, in the setting of lifestyle limiting claudication. The procedure was performed by Dr. Gwenlyn Found on 12/30/2013 via the right femoral artery. She was noted to have diffuse aortoiliac disease as well as SFA disease (see complete angiographic data listed below). She underwent successful bilateral common iliac stenting as well as right external iliac stenting. She was noted to have very friable/fragile iliac arteries, which resulted in linear dissection using undersized balloons. Thus, Dr. Gwenlyn Found elected to stop the procedure without delivering a drug-eluting balloon to the mid left SFA. She otherwise tolerated the procedure well. She left the Cath Lab in stable condition. She was kept overnight for observation as as well as hydration. She had no post-cath issues. The right femoral access site remained stable. She denied anterior groin, flank or low back pain. She had palpable 2+ distal pulses bilaterally. She had no pain with ambulation. Her renal function remained stable. She was continued on dual antiplatelet therapy with aspirin plus Plavix. She was discharged home the day following the procedure. She underwent repeat followup lower extremity arterial Doppler  studies revealing marked improvement in ABIs bilaterally. Her right ABI improved from 0.69-0.84. Left ABI improved from 0.70-0.98. The study also revealed that the newly placed stents demonstrated normal patency without restenosis.  She presents to clinic today for posthospital followup. She denies any lower extremity leg pain. She states that she has been taking things lightly since undergoing the procedure and has not engaged in any prolonged or fast-paced walking yet. She denies any anterior groin, flank or low back pain. She has discontinued smoking completely. She has been compliant with aspirin and Plavix. She has no major complaints.  Current Outpatient Prescriptions  Medication Sig Dispense Refill  . aspirin EC 81 MG tablet Take 81 mg by mouth daily.      Marland Kitchen atorvastatin (LIPITOR) 40 MG tablet Take 1 tablet (40 mg total) by mouth daily.  30 tablet  9  . clopidogrel (PLAVIX) 75 MG tablet Take 1 tablet (75 mg total) by mouth daily.  90 tablet  2  . metoprolol succinate (TOPROL-XL) 50 MG 24 hr tablet Take 50 mg by mouth daily. Take with or immediately following a meal.      . ramipril (ALTACE) 5 MG capsule Take 5 mg by mouth daily.       No current facility-administered medications for this visit.    Allergies  Allergen Reactions  . Morphine And Related Other (See Comments)    Raises blood pressure, makes hyper    History   Social History  . Marital Status: Married    Spouse Name: N/A    Number of Children: 3  . Years of Education: coolege    Occupational History  . 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  Smoking status: Current Some Day Smoker -- 0.12 packs/day for 46 years    Types: Cigarettes  . Smokeless tobacco: Never Used     Comment: 12/30/2013 "down to less than 1 pack cigarettes/wk"  . Alcohol Use: No  . Drug Use: No  . Sexual Activity: Not Currently   Other Topics Concern  . Not on file   Social History Narrative  . No narrative on  file     Review of Systems: General: negative for chills, fever, night sweats or weight changes.  Cardiovascular: negative for chest pain, dyspnea on exertion, edema, orthopnea, palpitations, paroxysmal nocturnal dyspnea or shortness of breath Dermatological: negative for rash Respiratory: negative for cough or wheezing Urologic: negative for hematuria Abdominal: negative for nausea, vomiting, diarrhea, bright red blood per rectum, melena, or hematemesis Neurologic: negative for visual changes, syncope, or dizziness All other systems reviewed and are otherwise negative except as noted above.    Blood pressure 139/73, pulse 69, height 5\' 6"  (1.676 m), weight 136 lb (61.689 kg).  General appearance: alert, cooperative and no distress Neck: no carotid bruit and no JVD Lungs: clear to auscultation bilaterally Heart: regular rate and rhythm, S1, S2 normal, no murmur, click, rub or gallop Extremities: no LEE Pulses: 2+ and symmetric Skin: warm and dry Neurologic: Grossly normal  EKG NSR 69 bpm  ASSESSMENT AND PLAN:   1. Peripheral vascular disease: Patient denies recurrent leg pain. Physical exam is notable for good bilateral distal pulses. Followup arterial Doppler studies, when compared to prior study, reveals marked  improvement in bilateral ABIs. Her right ABI improved from 0.69-0.84. Left ABI improved from 0.70-0.98. The study also revealed that the newly placed stents demonstrated normal patency without stenosis. Fortunately, she has discontinued smoking. Her recent arterial Doppler studies were reviewed by Dr. Gwenlyn Found and it has been recommended that she undergo repeat evaluation in 6 months. Continue dual antiplatelet therapy with aspirin and Plavix.Marland Kitchen   PLAN  F/u bilateral arterial dopplers and f/u with Dr. Gwenlyn Found in 6 months. Continue dual antiplatelet therapy with aspirin and Plavix. Patient was congratulated on her efforts in regards to smoking cessation and was encouraged to  continue to refrain from tobacco use.   Arvilla Market 01/15/2014 5:41 PM

## 2014-01-15 NOTE — Patient Instructions (Addendum)
Your physician recommends that you schedule a follow-up appointment in: 6 Months   Your physician has requested that you have a lower extremity arterial duplex. This test is an ultrasound of the arteries in the legs. It looks at arterial blood flow in the legs. Allow one hour for Lower Arterial scans. There are no restrictions or special instructions  6 Months

## 2014-01-17 ENCOUNTER — Encounter: Payer: Self-pay | Admitting: Cardiology

## 2014-01-20 ENCOUNTER — Telehealth: Payer: Self-pay | Admitting: Cardiovascular Disease

## 2014-01-20 ENCOUNTER — Encounter: Payer: Self-pay | Admitting: *Deleted

## 2014-01-20 NOTE — Telephone Encounter (Signed)
Note sent to Dr. Lavonne Chick ( dentist)

## 2014-01-20 NOTE — Telephone Encounter (Signed)
I can sign note if needed.  Leonie Man, MD

## 2014-01-20 NOTE — Telephone Encounter (Signed)
Is wanting to know if Andrea Hubbard needs pre-medication before dental work .

## 2014-01-20 NOTE — Telephone Encounter (Signed)
Already spoke to DOD Dr.Harding he advised if patient does not have any valvular disease or mechanical heart valve then patient will not require antibiotic before dental work.Almyra Free would like Dr.Berry to sign a note stating patient does not require antibiotic before dental work and fax back to her at fax # [phone].Dr.Berry's nurse out of office this week.She stated she could fax note next week.Message sent to Brita Romp RN.

## 2014-01-20 NOTE — Telephone Encounter (Signed)
Deferred to Dr. Ellyn Hack (DOD)

## 2014-02-03 DIAGNOSIS — R946 Abnormal results of thyroid function studies: Secondary | ICD-10-CM | POA: Diagnosis not present

## 2014-04-07 DIAGNOSIS — Z7982 Long term (current) use of aspirin: Secondary | ICD-10-CM | POA: Diagnosis not present

## 2014-04-07 DIAGNOSIS — E039 Hypothyroidism, unspecified: Secondary | ICD-10-CM | POA: Diagnosis not present

## 2014-04-25 ENCOUNTER — Encounter (HOSPITAL_COMMUNITY): Payer: Self-pay | Admitting: *Deleted

## 2014-05-26 ENCOUNTER — Ambulatory Visit (HOSPITAL_COMMUNITY)
Admission: RE | Admit: 2014-05-26 | Discharge: 2014-05-26 | Disposition: A | Discharge status: Home / Self Care | Payer: Medicare Other | Source: Ambulatory Visit | Attending: Cardiology | Admitting: Cardiology

## 2014-05-26 DIAGNOSIS — I6529 Occlusion and stenosis of unspecified carotid artery: Secondary | ICD-10-CM | POA: Diagnosis not present

## 2014-05-26 DIAGNOSIS — I672 Cerebral atherosclerosis: Secondary | ICD-10-CM | POA: Insufficient documentation

## 2014-05-26 DIAGNOSIS — I6522 Occlusion and stenosis of left carotid artery: Secondary | ICD-10-CM

## 2014-05-26 NOTE — Progress Notes (Signed)
Carotid Duplex Completed. Andrea Hubbard, BS, RDMS, RVT  

## 2014-05-29 ENCOUNTER — Telehealth: Payer: Self-pay | Admitting: *Deleted

## 2014-05-29 DIAGNOSIS — I6523 Occlusion and stenosis of bilateral carotid arteries: Secondary | ICD-10-CM

## 2014-05-29 NOTE — Telephone Encounter (Signed)
Carotid doppler results called to patient.  Repeat in one year.  Order placed.  Patient voiced understanding.

## 2014-05-29 NOTE — Telephone Encounter (Signed)
-----   Message from Sanda Klein, MD sent at 05/28/2014  2:40 PM EST ----- Moderate obstruction in the left carotid and the left subclavian, unchanged from last time. Repeat carotid Duplex in 12 months

## 2014-06-05 ENCOUNTER — Encounter (HOSPITAL_COMMUNITY): Payer: Self-pay | Admitting: Cardiovascular Disease

## 2014-06-25 ENCOUNTER — Other Ambulatory Visit (HOSPITAL_COMMUNITY): Payer: Self-pay | Admitting: Cardiovascular Disease

## 2014-06-25 DIAGNOSIS — I739 Peripheral vascular disease, unspecified: Secondary | ICD-10-CM

## 2014-07-03 DIAGNOSIS — N760 Acute vaginitis: Secondary | ICD-10-CM | POA: Diagnosis not present

## 2014-07-03 DIAGNOSIS — I2510 Atherosclerotic heart disease of native coronary artery without angina pectoris: Secondary | ICD-10-CM | POA: Diagnosis not present

## 2014-07-03 DIAGNOSIS — I1310 Hypertensive heart and chronic kidney disease without heart failure, with stage 1 through stage 4 chronic kidney disease, or unspecified chronic kidney disease: Secondary | ICD-10-CM | POA: Diagnosis not present

## 2014-07-03 DIAGNOSIS — N182 Chronic kidney disease, stage 2 (mild): Secondary | ICD-10-CM | POA: Diagnosis not present

## 2014-07-03 DIAGNOSIS — E039 Hypothyroidism, unspecified: Secondary | ICD-10-CM | POA: Diagnosis not present

## 2014-07-08 ENCOUNTER — Encounter: Payer: Self-pay | Admitting: Cardiovascular Disease

## 2014-07-09 ENCOUNTER — Encounter: Payer: Self-pay | Admitting: *Deleted

## 2014-07-22 ENCOUNTER — Ambulatory Visit (INDEPENDENT_AMBULATORY_CARE_PROVIDER_SITE_OTHER): Payer: Medicare Other | Admitting: Cardiovascular Disease

## 2014-07-22 ENCOUNTER — Ambulatory Visit (HOSPITAL_COMMUNITY)
Admission: RE | Admit: 2014-07-22 | Discharge: 2014-07-22 | Disposition: A | Discharge status: Home / Self Care | Payer: Medicare Other | Source: Ambulatory Visit | Attending: Cardiology | Admitting: Cardiology

## 2014-07-22 ENCOUNTER — Encounter: Payer: Self-pay | Admitting: Cardiovascular Disease

## 2014-07-22 VITALS — BP 130/70 | HR 55 | Resp 16 | Ht 66.0 in | Wt 134.2 lb

## 2014-07-22 DIAGNOSIS — I70203 Unspecified atherosclerosis of native arteries of extremities, bilateral legs: Secondary | ICD-10-CM | POA: Diagnosis not present

## 2014-07-22 DIAGNOSIS — Z720 Tobacco use: Secondary | ICD-10-CM

## 2014-07-22 DIAGNOSIS — I739 Peripheral vascular disease, unspecified: Secondary | ICD-10-CM | POA: Diagnosis not present

## 2014-07-22 DIAGNOSIS — I255 Ischemic cardiomyopathy: Secondary | ICD-10-CM

## 2014-07-22 DIAGNOSIS — I2510 Atherosclerotic heart disease of native coronary artery without angina pectoris: Secondary | ICD-10-CM

## 2014-07-22 DIAGNOSIS — I10 Essential (primary) hypertension: Secondary | ICD-10-CM

## 2014-07-22 DIAGNOSIS — F17210 Nicotine dependence, cigarettes, uncomplicated: Secondary | ICD-10-CM

## 2014-07-22 LAB — MDC_IDC_ENUM_SESS_TYPE_INCLINIC
Date Time Interrogation Session: 20160126050000
HIGH POWER IMPEDANCE MEASURED VALUE: 57 Ohm
Implantable Pulse Generator Serial Number: 275874
Lead Channel Impedance Value: 397 Ohm
Lead Channel Pacing Threshold Amplitude: 0.8 V
Lead Channel Setting Pacing Amplitude: 2.4 V
Lead Channel Setting Pacing Pulse Width: 0.5 ms
Lead Channel Setting Sensing Sensitivity: 0.3 mV
MDC IDC MSMT LEADCHNL RV PACING THRESHOLD PULSEWIDTH: 0.5 ms
MDC IDC MSMT LEADCHNL RV SENSING INTR AMPL: 6.9 mV
Zone Setting Detection Interval: 293 ms
Zone Setting Detection Interval: 324 ms

## 2014-07-22 NOTE — Progress Notes (Signed)
Patient ID: Andrea Hubbard, female   DOB: 01-Apr-1946, 69 y.o.   MRN: [id number]     Reason for office visit Defibrillator followup, ischemic cardio myopathy, coronary artery disease  Andrea Hubbard returns for routine followup. She is followed by Dr. Quay Burow for coronary disease, ischemic cardiomyopathy, carotid stenosis and peripheral arterial disease of the lower extremities. She has moderate to severely depressed left ventricular ejection fraction at about 30%. She received a single-chamber defibrillator for primary prevention in November of 2011. She's never received a defibrillator shock.   She has gradually cut down on smoking to less than a pack of cigarettes per week, having difficulty letting go of the habit altogether. She denies angina, dyspnea, palpitations, defibrillator discharges, focal neurological complaints or any signs of active cardiovascular illness.  Interrogation of her defibrillator shows roughly 9.5 years generator longevity and good lead parameters. She has had 2 brief episodes of nonsustained ventricular tachycardia in the last 12 months consisting of 4 beats and 13 beats respectively.  Allergies  Allergen Reactions  . Morphine And Related Other (See Comments)    Raises blood pressure, makes hyper    Current Outpatient Prescriptions  Medication Sig Dispense Refill  . aspirin EC 81 MG tablet Take 81 mg by mouth daily.    Marland Kitchen atorvastatin (LIPITOR) 40 MG tablet Take 1 tablet (40 mg total) by mouth daily. 30 tablet 9  . clopidogrel (PLAVIX) 75 MG tablet Take 1 tablet (75 mg total) by mouth daily. 90 tablet 2  . levothyroxine (SYNTHROID, LEVOTHROID) 25 MCG tablet Take 25 mcg by mouth every morning.  2  . metoprolol succinate (TOPROL-XL) 50 MG 24 hr tablet Take 50 mg by mouth daily. Take with or immediately following a meal.    . ramipril (ALTACE) 5 MG capsule Take 5 mg by mouth daily.     No current facility-administered medications for this visit.    Past  Medical History  Diagnosis Date  . Hypertension   . Coronary artery disease   . Dysrhythmia   . Hypercholesteremia   . Tobacco abuse   . PVD (peripheral vascular disease)   . History of nuclear stress test 04/26/2012    mod-severe perfusion defect in basal inferior septal and basal inf, mid inferoseptal, mid inferio & apical inferior - consistent with infarct/scar; low risk   . Carotid artery disease   . Automatic implantable cardioverter-defibrillator in situ 2011    [employer]   . Silent myocardial infarction 2009; 2010    Past Surgical History  Procedure Laterality Date  . Angioplasty / stenting femoral Right 2010  . Endarterectomy Right 2010  . Cardiac defibrillator placement  04/2010  . Colonoscopy  06/11/2012    Procedure: COLONOSCOPY;  Surgeon: Juanita Craver, MD;  Location: [facility];  Service: Endoscopy;  Laterality: N/A;  . Lower extremity arterial doppler  04/2012    ABI 0.63 on R and 0.77 on L  . Percutaneous coronary stent intervention (pci-s)      in [place]  . Transthoracic echocardiogram  04/26/2012    EF 30-35%; imparied LV relaxation; mod post wall hypokinesis, mod-severe ant wall kypokinesis and severe septal hypokinesis; RV systolic function mod redcued, with RV systolic pressure elevated; mild AVR  . Carotid duplex  04/2012    R ECA - severe vessel narrowing with inc velocities 70-99% diameter reduction; L subclav - elevated velocities 50-69% diameter reduction; L vertebral - occlusive disease; L bulb/prox ICA - elevated velocities in prox & mid segments on ICA, 50-69%  diameter reduction   . Iliac artery stent Bilateral 12/30/2013  . Cesarean section  1979  . Lower extremity angiogram Bilateral 12/30/2013    Procedure: LOWER EXTREMITY ANGIOGRAM;  Surgeon: Lorretta Harp, MD;  Location: [facility] CATH LAB;  Service: Cardiovascular;  Laterality: Bilateral;    Family History  Problem Relation Age of Onset  . Leukemia Mother   . Pancreatic cancer Father   .  Diabetes Maternal Grandmother   . Sudden death Maternal Grandfather   . Stroke Paternal Grandfather   . Rheum arthritis Sister   . Hypertension Son     History   Social History  . Marital Status: Married    Spouse Name: N/A    Number of Children: 3  . Years of Education: coolege    Occupational History  . 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 Smoking status: Current Some Day Smoker -- 0.12 packs/day for 46 years    Types: Cigarettes  . Smokeless tobacco: Never Used     Comment: 12/30/2013 "down to less than 1 pack cigarettes/wk"  . Alcohol Use: No  . Drug Use: No  . Sexual Activity: Not Currently   Other Topics Concern  . Not on file   Social History Narrative    Review of systems: The patient specifically denies any chest pain at rest or with exertion, dyspnea at rest or with exertion, orthopnea, paroxysmal nocturnal dyspnea, syncope, palpitations, focal neurological deficits, intermittent claudication, lower extremity edema, unexplained weight gain, cough, hemoptysis or wheezing.  The patient also denies abdominal pain, nausea, vomiting, dysphagia, diarrhea, constipation, polyuria, polydipsia, dysuria, hematuria, frequency, urgency, abnormal bleeding or bruising, fever, chills, unexpected weight changes, mood swings, change in skin or hair texture, change in voice quality, auditory or visual problems, allergic reactions or rashes, new musculoskeletal complaints other than usual "aches and pains".   PHYSICAL EXAM BP 130/70 mmHg  Pulse 55  Resp 16  Ht 5\' 6"  (1.676 m)  Wt 60.873 kg (134 lb 3.2 oz)  BMI 21.67 kg/m2  General: Alert, oriented x3, no distress Head: no evidence of trauma, PERRL, EOMI, no exophtalmos or lid lag, no myxedema, no xanthelasma; normal ears, nose and oropharynx Neck: normal jugular venous pulsations and no hepatojugular reflux; brisk carotid pulses without delay and no carotid bruits Chest: clear to auscultation, no  signs of consolidation by percussion or palpation, normal fremitus, symmetrical and full respiratory excursions; left subclavian defibrillator site is prominent since she is so slender but otherwise appears healthy Cardiovascular: normal position and quality of the apical impulse, regular rhythm, normal first and second heart sounds, no murmurs, rubs or gallops Abdomen: no tenderness or distention, no masses by palpation, no abnormal pulsatility or arterial bruits, normal bowel sounds, no hepatosplenomegaly Extremities: no clubbing, cyanosis or edema; 2+ radial, ulnar and brachial pulses bilaterally; 2+ right femoral, posterior tibial and dorsalis pedis pulses; 2+ left femoral, posterior tibial and dorsalis pedis pulses; no subclavian or femoral bruits Neurological: grossly nonfocal   EKG: Sinus rhythm, old inferior infarction, poor anterior R-wave progression, inverted T waves in leads V4-V6 and in the inferior leads, unchanged from previous tracings  Lipid Panel  No results found for: CHOL, TRIG, HDL, CHOLHDL, VLDL, LDLCALC, LDLDIRECT  BMET    Component Value Date/Time   NA 140 12/31/2013 0515   K 3.8 12/31/2013 0515   CL 103 12/31/2013 0515   CO2 22 12/31/2013 0515   GLUCOSE 85 12/31/2013 0515   BUN 13 12/31/2013  0515   CREATININE 0.75 12/31/2013 0515   CREATININE 0.76 12/23/2013 1003   CALCIUM 8.7 12/31/2013 0515   GFRNONAA 86* 12/31/2013 0515   GFRAA >90 12/31/2013 0515     ASSESSMENT AND PLAN  Normal ICD function. She'll perform every 3 months remotely downloads with the cerebellar transmitter and follow-up in the office in 12 months. Encouraged her to quit smoking altogether. No signs of active heart failure or coronary insufficiency.  Orders Placed This Encounter  Procedures  . EKG 12-Lead   Meds ordered this encounter  Medications  . levothyroxine (SYNTHROID, LEVOTHROID) 25 MCG tablet    Sig: Take 25 mcg by mouth every morning.    Refill:  [place] Zoella Roberti, MD, [facility] [phone] office [phone] pager

## 2014-07-22 NOTE — Patient Instructions (Addendum)
Remote monitoring is used to monitor your ICD from home. This monitoring reduces the number of office visits required to check your device to one time per year. It allows [place] to keep an eye on the functioning of your device to ensure it is working properly. You are scheduled for a device check from home on 10-21-2014. You may send your transmission at any time that day. If you have a wireless device, the transmission will be sent automatically. After your physician reviews your transmission, you will receive a postcard with your next transmission date.  Your physician recommends that you schedule a follow-up appointment in: 12 months with Dr.Croitoru + ICD check [employer].

## 2014-07-22 NOTE — Progress Notes (Signed)
Lower Extremity Arterial Duplex Completed. °Brianna L Mazza,RVT °

## 2014-07-29 ENCOUNTER — Encounter: Payer: Self-pay | Admitting: Cardiovascular Disease

## 2014-08-19 DIAGNOSIS — N760 Acute vaginitis: Secondary | ICD-10-CM | POA: Diagnosis not present

## 2014-08-19 DIAGNOSIS — N898 Other specified noninflammatory disorders of vagina: Secondary | ICD-10-CM | POA: Diagnosis not present

## 2014-08-19 DIAGNOSIS — Z130 Encounter for screening for diseases of the blood and blood-forming organs and certain disorders involving the immune mechanism: Secondary | ICD-10-CM | POA: Diagnosis not present

## 2014-08-19 DIAGNOSIS — N951 Menopausal and female climacteric states: Secondary | ICD-10-CM | POA: Diagnosis not present

## 2014-08-19 DIAGNOSIS — R319 Hematuria, unspecified: Secondary | ICD-10-CM | POA: Diagnosis not present

## 2014-08-19 DIAGNOSIS — Z1231 Encounter for screening mammogram for malignant neoplasm of breast: Secondary | ICD-10-CM | POA: Diagnosis not present

## 2014-08-19 DIAGNOSIS — Z01411 Encounter for gynecological examination (general) (routine) with abnormal findings: Secondary | ICD-10-CM | POA: Diagnosis not present

## 2014-09-02 DIAGNOSIS — N760 Acute vaginitis: Secondary | ICD-10-CM | POA: Diagnosis not present

## 2014-09-02 DIAGNOSIS — E780 Pure hypercholesterolemia: Secondary | ICD-10-CM | POA: Diagnosis not present

## 2014-09-02 DIAGNOSIS — R311 Benign essential microscopic hematuria: Secondary | ICD-10-CM | POA: Diagnosis not present

## 2014-09-02 DIAGNOSIS — Z79899 Other long term (current) drug therapy: Secondary | ICD-10-CM | POA: Diagnosis not present

## 2014-09-15 ENCOUNTER — Other Ambulatory Visit: Payer: Self-pay | Admitting: Internal Medicine

## 2014-09-15 DIAGNOSIS — R319 Hematuria, unspecified: Secondary | ICD-10-CM

## 2014-09-30 ENCOUNTER — Ambulatory Visit
Admission: RE | Admit: 2014-09-30 | Discharge: 2014-09-30 | Disposition: A | Discharge status: Home / Self Care | Payer: Medicare Other | Source: Ambulatory Visit | Attending: Internal Medicine | Admitting: Internal Medicine

## 2014-09-30 DIAGNOSIS — N189 Chronic kidney disease, unspecified: Secondary | ICD-10-CM | POA: Diagnosis not present

## 2014-09-30 DIAGNOSIS — R319 Hematuria, unspecified: Secondary | ICD-10-CM

## 2014-10-05 ENCOUNTER — Other Ambulatory Visit: Payer: Self-pay | Admitting: Cardiovascular Disease

## 2014-10-21 ENCOUNTER — Telehealth: Payer: Self-pay | Admitting: Cardiology

## 2014-10-21 ENCOUNTER — Encounter: Payer: Medicare Other | Admitting: *Deleted

## 2014-10-21 NOTE — Telephone Encounter (Signed)
LMOVM reminding pt to send remote transmission.   

## 2014-10-22 ENCOUNTER — Encounter: Payer: Self-pay | Admitting: Cardiology

## 2014-10-24 ENCOUNTER — Telehealth (HOSPITAL_COMMUNITY): Payer: Self-pay | Admitting: *Deleted

## 2014-11-12 ENCOUNTER — Other Ambulatory Visit: Payer: Self-pay | Admitting: Cardiovascular Disease

## 2014-11-12 NOTE — Telephone Encounter (Signed)
plavix refilled #90 with 2 refills 10/06/14

## 2014-11-20 ENCOUNTER — Other Ambulatory Visit: Payer: Self-pay | Admitting: Cardiovascular Disease

## 2014-11-20 DIAGNOSIS — R8290 Unspecified abnormal findings in urine: Secondary | ICD-10-CM | POA: Diagnosis not present

## 2014-11-20 NOTE — Telephone Encounter (Signed)
Rx(s) sent to pharmacy electronically.  

## 2014-11-25 ENCOUNTER — Encounter (HOSPITAL_COMMUNITY): Payer: Medicare Other

## 2014-12-06 ENCOUNTER — Other Ambulatory Visit: Payer: Self-pay | Admitting: Cardiovascular Disease

## 2014-12-08 NOTE — Telephone Encounter (Signed)
E SENT TO PHARMACY 

## 2015-02-02 DIAGNOSIS — R8290 Unspecified abnormal findings in urine: Secondary | ICD-10-CM | POA: Diagnosis not present

## 2015-03-26 DIAGNOSIS — N952 Postmenopausal atrophic vaginitis: Secondary | ICD-10-CM | POA: Diagnosis not present

## 2015-03-26 DIAGNOSIS — N898 Other specified noninflammatory disorders of vagina: Secondary | ICD-10-CM | POA: Diagnosis not present

## 2015-07-29 ENCOUNTER — Encounter: Payer: Self-pay | Admitting: *Deleted

## 2015-12-10 ENCOUNTER — Other Ambulatory Visit: Payer: Self-pay | Admitting: Cardiovascular Disease

## 2015-12-10 NOTE — Telephone Encounter (Signed)
Rx(s) sent to pharmacy electronically.  

## 2016-01-19 ENCOUNTER — Other Ambulatory Visit: Payer: Self-pay

## 2016-01-19 MED ORDER — CLOPIDOGREL BISULFATE 75 MG PO TABS: 75.0000 mg | ORAL_TABLET | Freq: Every day | ORAL | 0 refills | Status: DC

## 2016-01-21 ENCOUNTER — Other Ambulatory Visit: Payer: Self-pay | Admitting: *Deleted

## 2016-01-21 MED ORDER — CLOPIDOGREL BISULFATE 75 MG PO TABS: 75.0000 mg | ORAL_TABLET | Freq: Every day | ORAL | 0 refills | Status: DC

## 2016-02-11 ENCOUNTER — Telehealth: Payer: Self-pay | Admitting: Cardiovascular Disease

## 2016-02-11 NOTE — Telephone Encounter (Signed)
Closed encounter °

## 2016-03-08 ENCOUNTER — Encounter: Payer: Self-pay | Admitting: Cardiovascular Disease

## 2016-03-08 ENCOUNTER — Other Ambulatory Visit: Payer: Self-pay | Admitting: Cardiovascular Disease

## 2016-03-08 ENCOUNTER — Ambulatory Visit (INDEPENDENT_AMBULATORY_CARE_PROVIDER_SITE_OTHER): Payer: Medicare Other | Admitting: Cardiovascular Disease

## 2016-03-08 VITALS — BP 114/59 | HR 65 | Ht 66.0 in | Wt 145.2 lb

## 2016-03-08 DIAGNOSIS — Z95810 Presence of automatic (implantable) cardiac defibrillator: Secondary | ICD-10-CM | POA: Diagnosis not present

## 2016-03-08 DIAGNOSIS — I739 Peripheral vascular disease, unspecified: Secondary | ICD-10-CM

## 2016-03-08 DIAGNOSIS — I255 Ischemic cardiomyopathy: Secondary | ICD-10-CM | POA: Diagnosis not present

## 2016-03-08 DIAGNOSIS — E785 Hyperlipidemia, unspecified: Secondary | ICD-10-CM | POA: Diagnosis not present

## 2016-03-08 DIAGNOSIS — I6523 Occlusion and stenosis of bilateral carotid arteries: Secondary | ICD-10-CM | POA: Diagnosis not present

## 2016-03-08 DIAGNOSIS — I2510 Atherosclerotic heart disease of native coronary artery without angina pectoris: Secondary | ICD-10-CM

## 2016-03-08 DIAGNOSIS — I10 Essential (primary) hypertension: Secondary | ICD-10-CM

## 2016-03-08 DIAGNOSIS — Z79899 Other long term (current) drug therapy: Secondary | ICD-10-CM

## 2016-03-08 DIAGNOSIS — I5022 Chronic systolic (congestive) heart failure: Secondary | ICD-10-CM | POA: Insufficient documentation

## 2016-03-08 DIAGNOSIS — Z720 Tobacco use: Secondary | ICD-10-CM

## 2016-03-08 DIAGNOSIS — I6529 Occlusion and stenosis of unspecified carotid artery: Secondary | ICD-10-CM | POA: Insufficient documentation

## 2016-03-08 HISTORY — DX: Presence of automatic (implantable) cardiac defibrillator: Z95.810

## 2016-03-08 NOTE — Progress Notes (Signed)
Cardiology Office Note    Date:  03/08/2016   ID:  Andrea Hubbard, DOB 1946-03-14, MRN [id number]  PCP:  Maximino Greenland, MD  Cardiologist:  Quay Burow, M.D.; Sanda Klein, MD   Chief Complaint  Patient presents with  . Follow-up    defib checl; sob along with dizziness occasionally. numbness in the legs.    History of Present Illness:  Andrea Hubbard is a 70 y.o. female here for follow-up on her defibrillator single chamber Therapist, sports implanted November 2011 in [place], primary prevention, no shocks delivered).   Interrogation of her device shows normal function. Anticipated generator longevity is 7.5 years. No ventricular pacing and a single episode of nonsustained ventricular tachycardia occurring in the last 20 months.  She denies angina either at rest or with exertion and has not had problems with dyspnea at rest or orthopnea or PND. She denies leg edema and does not have classical intermittent claudication. She describes numbness in her left leg if she sits for prolonged periods of time and believes this might be due to a vascular problem, although it is actually relieved by walking.  She has drastically reduced her smoking and now smokes an average of one cigarette a week. He has not had any neurological complaints other than the leg numbness ascribed above which is clearly positional. She has gained roughly 10 pounds since her last appointment but remains at a healthy BMI of only 23.  She has an extensive history of coronary artery disease, severe ischemic cardiomyopathy (EF 30% extensive inferior scar) peripheral arterial disease per his bilateral iliac stents July 2015) and carotid artery disease (right carotid endarterectomy), hypertension, hyperlipidemia and previous heavy smoking.  This is [name]'s first office visit with me since January 2016. She has not seen Dr. Gwenlyn Found since June 2015 and last saw Ellen Henri in July 2015.  Past Medical History:    Diagnosis Date  . Automatic implantable cardioverter-defibrillator in situ 2011   [employer]   . Carotid artery disease ([place])   . Coronary artery disease   . Dysrhythmia   . History of nuclear stress test 04/26/2012   mod-severe perfusion defect in basal inferior septal and basal inf, mid inferoseptal, mid inferio & apical inferior - consistent with infarct/scar; low risk   . Hypercholesteremia   . Hypertension   . PVD (peripheral vascular disease) ([place])   . Silent myocardial infarction ([place]) 2009; 2010  . Tobacco abuse     Past Surgical History:  Procedure Laterality Date  . ANGIOPLASTY / STENTING FEMORAL Right 2010  . CARDIAC DEFIBRILLATOR PLACEMENT  04/2010  . Carotid Duplex  04/2012   R ECA - severe vessel narrowing with inc velocities 70-99% diameter reduction; L subclav - elevated velocities 50-69% diameter reduction; L vertebral - occlusive disease; L bulb/prox ICA - elevated velocities in prox & mid segments on ICA, 50-69% diameter reduction   . CESAREAN SECTION  1979  . COLONOSCOPY  06/11/2012   Procedure: COLONOSCOPY;  Surgeon: Juanita Craver, MD;  Location: [facility];  Service: Endoscopy;  Laterality: N/A;  . ENDARTERECTOMY Right 2010  . ILIAC ARTERY STENT Bilateral 12/30/2013  . LOWER EXTREMITY ANGIOGRAM Bilateral 12/30/2013   Procedure: LOWER EXTREMITY ANGIOGRAM;  Surgeon: Lorretta Harp, MD;  Location: [facility] CATH LAB;  Service: Cardiovascular;  Laterality: Bilateral;  . Lower Extremity Arterial Doppler  04/2012   ABI 0.63 on R and 0.77 on L  . PERCUTANEOUS CORONARY STENT INTERVENTION (PCI-S)     in [place]  .  TRANSTHORACIC ECHOCARDIOGRAM  04/26/2012   EF 30-35%; imparied LV relaxation; mod post wall hypokinesis, mod-severe ant wall kypokinesis and severe septal hypokinesis; RV systolic function mod redcued, with RV systolic pressure elevated; mild AVR    Current Medications: Outpatient Medications Prior to Visit  Medication Sig Dispense Refill  . aspirin EC  81 MG tablet Take 81 mg by mouth daily.    Marland Kitchen atorvastatin (LIPITOR) 40 MG tablet Take 1 tablet (40 mg total) by mouth daily. 30 tablet 9  . clopidogrel (PLAVIX) 75 MG tablet Take 1 tablet (75 mg total) by mouth daily. NEED OV. 90 tablet 0  . levothyroxine (SYNTHROID, LEVOTHROID) 25 MCG tablet Take 25 mcg by mouth every morning.  2  . metoprolol succinate (TOPROL-XL) 50 MG 24 hr tablet Take 50 mg by mouth daily. Take with or immediately following a meal.    . ramipril (ALTACE) 5 MG capsule Take 5 mg by mouth daily.     No facility-administered medications prior to visit.      Allergies:   Morphine and related   Social History   Social History  . Marital status: Married    Spouse name: N/A  . Number of children: 3  . Years of education: coolege    Occupational History  . 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 Smoking status: Current Some Day Smoker    Packs/day: 0.12    Years: 46.00    Types: Cigarettes  . Smokeless tobacco: Never Used     Comment: 12/30/2013 "down to less than 1 pack cigarettes/wk"  . Alcohol use No  . Drug use: No  . Sexual activity: Not Currently   Other Topics Concern  . None   Social History Narrative  . None     Family History:  The patient's family history includes Diabetes in her maternal grandmother; Hypertension in her son; Leukemia in her mother; Pancreatic cancer in her father; Rheum arthritis in her sister; Stroke in her paternal grandfather; Sudden death in her maternal grandfather.   ROS:   Please see the history of present illness.    ROS All other systems reviewed and are negative.   PHYSICAL EXAM:   VS:  BP (!) 114/59   Pulse 65   Ht 5\' 6"  (1.676 m)   Wt 145 lb 3.2 oz (65.9 kg)   BMI 23.44 kg/m    GEN: Well nourished, well developed, in no acute distress  HEENT: normal  Neck: no JVD, loud bilateral carotid bruits, no goiter or masses Cardiac: RRR; no murmurs, rubs, or gallops,no edema, healthy left  subclavian defibrillator site. Both feet are warm but I cannot detect pedal pulses in either foot. She has normal capillary refill. Radial pulses are normal. Bilateral femoral bruits.  Respiratory:  clear to auscultation bilaterally, normal work of breathing GI: soft, nontender, nondistended, + BS MS: no deformity or atrophy  Skin: warm and dry, no rash Neuro:  Alert and Oriented x 3, Strength and sensation are intact Psych: euthymic mood, full affect  Wt Readings from Last 3 Encounters:  03/08/16 145 lb 3.2 oz (65.9 kg)  07/22/14 134 lb 3.2 oz (60.9 kg)  01/15/14 136 lb (61.7 kg)      Studies/Labs Reviewed:   EKG:  EKG is ordered today.  The ekg ordered today demonstrates Normal sinus rhythm with frequent PVCs (3 seen on the 10 second strip). QS pattern in leads V1-V4 consistent with old anteroseptal infarction. Inferior Q waves  with mild ST segment elevation in leads 2, 3, aVF consistent with a "frozen infarct" pattern. There is chronic T-wave inversion in leads V3-V6 as well as the inferior leads.    ASSESSMENT:    1. Chronic systolic heart failure ([place])   2. ICD (implantable cardioverter-defibrillator) in place   3. Coronary artery disease involving native coronary artery of native heart without angina pectoris   4. PAD (peripheral artery disease),    5. Carotid artery stenosis, bilateral   6. Hyperlipidemia   7. Essential hypertension   8. Tobacco abuse   9. Medication management   10. Claudication [facility])      PLAN:  In order of problems listed above:  1. CHF: Despite markedly depressed LVEF she has no signs of hypervolemia and has good functional status. She is on appropriate ACE inhibitor and beta blocker therapy. Her diastolic blood pressure is relatively low it is doubtful she would tolerate higher doses. 2. ICD: Normal device function. Remote downloads every 3 months and yearly office visits. 3. CAD: She does not have angina pectoris. On chronic dual antiplatelet  therapy and statin. 2013 nuclear stress test showed a large inferior wall scar but no reversible ischemia. 4. PAD: Her symptoms sound more like right sciatica due to lumbar spine disease to me, but she does not have any pulses in her feet. I recommended repeat duplex ultrasound study and follow-up with Dr. Gwenlyn Found. 5. Carotid stenosis: Status post right carotid endarterectomy. Time to repeat duplex ultrasound. She does not have neurological symptoms and is on dual antiplatelet therapy. 6. HLP: On statin. 7. HTN: Excellent control 8. Congratulated on drastic reduction tobacco use, but encouraged to quit altogether due to risk of relapse.    Medication Adjustments/Labs and Tests Ordered: Current medicines are reviewed at length with the patient today.  Concerns regarding medicines are outlined above.  Medication changes, Labs and Tests ordered today are listed in the Patient Instructions below. Patient Instructions  Medication Instructions: Dr [name] recommends that you continue on your current medications as directed. Please refer to the Current Medication list given to you today.  Labwork: Your physician recommends that you return for lab work at your earliest [place].  Testing/Procedures: 1. Lower Extremity Arterial Dopplers - Your physician has requested that you have a lower or upper extremity arterial duplex. This test is an ultrasound of the arteries in the legs or arms. It looks at arterial blood flow in the legs and arms. Allow one hour for Lower and Upper Arterial scans. There are no restrictions or special instructions.  2. Carotid Artery Dopplers - Your physician has requested that you have a carotid duplex. This test is an ultrasound of the carotid arteries in your neck. It looks at blood flow through these arteries that supply the brain with blood. Allow one hour for this exam. There are no restrictions or special instructions.  Follow-up: Dr [name] recommends that  you schedule a follow-up appointment first available with Dr Gwenlyn Found.  Your physician recommends that you schedule aa appointment in 3 months in the device clinic.  Dr [name] recommends that you schedule a follow-up appointment in 12 months with a device check. You will receive a reminder letter in the mail two months in advance. If you don't receive a letter, please call our office to schedule the follow-up appointment.  If you need a refill on your cardiac medications before your next appointment, please call your pharmacy.    Signed, Sanda Klein, MD  03/08/2016  4:57 PM    [facility] [place], [place], [place]  [address] Phone: [phone]; Fax: [phone]

## 2016-03-08 NOTE — Patient Instructions (Signed)
Medication Instructions: Dr [name] recommends that you continue on your current medications as directed. Please refer to the Current Medication list given to you today.  Labwork: Your physician recommends that you return for lab work at your earliest [facility].  Testing/Procedures: 1. Lower Extremity Arterial Dopplers - Your physician has requested that you have a lower or upper extremity arterial duplex. This test is an ultrasound of the arteries in the legs or arms. It looks at arterial blood flow in the legs and arms. Allow one hour for Lower and Upper Arterial scans. There are no restrictions or special instructions.  2. Carotid Artery Dopplers - Your physician has requested that you have a carotid duplex. This test is an ultrasound of the carotid arteries in your neck. It looks at blood flow through these arteries that supply the brain with blood. Allow one hour for this exam. There are no restrictions or special instructions.  Follow-up: Dr [name] recommends that you schedule a follow-up appointment first available with Dr Gwenlyn Found.  Your physician recommends that you schedule aa appointment in 3 months in the device clinic.  Dr [name] recommends that you schedule a follow-up appointment in 12 months with a device check. You will receive a reminder letter in the mail two months in advance. If you don't receive a letter, please call our office to schedule the follow-up appointment.  If you need a refill on your cardiac medications before your next appointment, please call your pharmacy.

## 2016-03-16 ENCOUNTER — Ambulatory Visit (HOSPITAL_COMMUNITY)
Admission: RE | Admit: 2016-03-16 | Discharge: 2016-03-16 | Disposition: A | Discharge status: Home / Self Care | Payer: Medicare Other | Source: Ambulatory Visit | Attending: Cardiovascular Disease | Admitting: Cardiovascular Disease

## 2016-03-16 ENCOUNTER — Other Ambulatory Visit: Payer: Self-pay | Admitting: Cardiovascular Disease

## 2016-03-16 DIAGNOSIS — I6523 Occlusion and stenosis of bilateral carotid arteries: Secondary | ICD-10-CM | POA: Diagnosis not present

## 2016-03-16 DIAGNOSIS — I739 Peripheral vascular disease, unspecified: Secondary | ICD-10-CM | POA: Diagnosis not present

## 2016-03-25 ENCOUNTER — Telehealth: Payer: Self-pay | Admitting: Cardiovascular Disease

## 2016-03-25 NOTE — Telephone Encounter (Signed)
Returned call to patient and discussed results and recommendation for follow up appt. She voiced understanding and states awareness of upcoming appt w Dr. Gwenlyn Found. No further concerns or questions at this time.

## 2016-03-25 NOTE — Telephone Encounter (Signed)
New message   Pt verbalized that she wants to speak to the rn for carotid results

## 2016-03-25 NOTE — Progress Notes (Signed)
Results discussed w patient, who verbalized understanding.

## 2016-04-05 DIAGNOSIS — E785 Hyperlipidemia, unspecified: Secondary | ICD-10-CM | POA: Diagnosis not present

## 2016-04-05 DIAGNOSIS — Z79899 Other long term (current) drug therapy: Secondary | ICD-10-CM | POA: Diagnosis not present

## 2016-04-06 ENCOUNTER — Ambulatory Visit (INDEPENDENT_AMBULATORY_CARE_PROVIDER_SITE_OTHER): Payer: Medicare Other | Admitting: Cardiovascular Disease

## 2016-04-06 ENCOUNTER — Encounter: Payer: Self-pay | Admitting: Cardiovascular Disease

## 2016-04-06 VITALS — BP 146/80 | HR 64 | Ht 66.0 in | Wt 144.0 lb

## 2016-04-06 DIAGNOSIS — I255 Ischemic cardiomyopathy: Secondary | ICD-10-CM | POA: Diagnosis not present

## 2016-04-06 DIAGNOSIS — I10 Essential (primary) hypertension: Secondary | ICD-10-CM

## 2016-04-06 DIAGNOSIS — I2510 Atherosclerotic heart disease of native coronary artery without angina pectoris: Secondary | ICD-10-CM | POA: Diagnosis not present

## 2016-04-06 DIAGNOSIS — I6523 Occlusion and stenosis of bilateral carotid arteries: Secondary | ICD-10-CM | POA: Diagnosis not present

## 2016-04-06 DIAGNOSIS — I739 Peripheral vascular disease, unspecified: Secondary | ICD-10-CM | POA: Diagnosis not present

## 2016-04-06 DIAGNOSIS — Z720 Tobacco use: Secondary | ICD-10-CM | POA: Diagnosis not present

## 2016-04-06 DIAGNOSIS — E7800 Pure hypercholesterolemia, unspecified: Secondary | ICD-10-CM

## 2016-04-06 LAB — LIPID PANEL
Cholesterol: 147 mg/dL (ref 125–200)
HDL: 42 mg/dL — AB (ref >=46)
LDL CALC: 79 mg/dL (ref <130)
Total CHOL/HDL Ratio: 3.5 Ratio (ref <=5.0)
Triglycerides: 130 mg/dL (ref <150)
VLDL: 26 mg/dL (ref <30)

## 2016-04-06 LAB — COMPREHENSIVE METABOLIC PANEL
ALT: 13 U/L (ref 6–29)
AST: 17 U/L (ref 10–35)
Albumin: 3.6 g/dL (ref 3.6–5.1)
Alkaline Phosphatase: 48 U/L (ref 33–130)
BUN: 13 mg/dL (ref 7–25)
CHLORIDE: 109 mmol/L (ref 98–110)
CO2: 25 mmol/L (ref 20–31)
Calcium: 9 mg/dL (ref 8.6–10.4)
Creat: 0.88 mg/dL (ref 0.50–0.99)
Glucose, Bld: 99 mg/dL (ref 65–99)
Potassium: 4.3 mmol/L (ref 3.5–5.3)
Sodium: 142 mmol/L (ref 135–146)
TOTAL PROTEIN: 6.2 g/dL (ref 6.1–8.1)
Total Bilirubin: 0.6 mg/dL (ref 0.2–1.2)

## 2016-04-06 NOTE — Assessment & Plan Note (Signed)
History of discontinued tobacco abuse approximately 9 months ago.

## 2016-04-06 NOTE — Assessment & Plan Note (Signed)
History of CAD status post PCI and stenting of her heart remotely. She is on aspirin and Plavix. She denies chest pain or shortness of breath.

## 2016-04-06 NOTE — Patient Instructions (Signed)
Medication Instructions:  Your physician recommends that you continue on your current medications as directed. Please refer to the Current Medication list given to you today.   Testing/Procedures: Your physician has requested that you have a lower extremity arterial doppler- During this test, ultrasound is used to evaluate arterial blood flow in the legs. Allow approximately one hour for this exam.  IN ONE YEAR PRIOR TO APPT WITH DR BERRY.  Your physician has requested that you have a carotid duplex. This test is an ultrasound of the carotid arteries in your neck. It looks at blood flow through these arteries that supply the brain with blood. Allow one hour for this exam. There are no restrictions or special instructions.  IN ONE YEAR PRIOR TO APPT WITH DR BERRY.     Follow-Up: Your physician wants you to follow-up in: [place]. You will receive a reminder letter in the mail two months in advance. If you don't receive a letter, please call our office to schedule the follow-up appointment.   If you need a refill on your cardiac medications before your next appointment, please call your pharmacy.

## 2016-04-06 NOTE — Assessment & Plan Note (Signed)
History of hyperlipidemia on statin therapy recent lipid profile performed 04/05/16 revealing total cholesterol of 47, LDL 79 and HDL of 42.

## 2016-04-06 NOTE — Assessment & Plan Note (Signed)
History of carotid artery disease status post right carotid endarterectomy for asymptomatic high-grade right ICA stenosis. Her most recent carotid Dopplers performed 03/16/16 revealed a widely patent endarterectomy site with moderate left ICA stenosis. Following this on an annual basis.

## 2016-04-06 NOTE — Assessment & Plan Note (Signed)
History of peripheral arterial disease status post remote left external iliac artery stenting. I did perform peripheral angiography and intervention on her 12/30/13. I placed a stent in her left common iliac artery about her previously placed external iliac artery stent. She did have diffuse disease in her left SFA with 70% proximal mid with a 90% focal lesion in the mid left SFA with two-vessel runoff. I performed PTCA with a chocolate blue resulting in linear dissection. Ultimately left alone after that. I also performed stenting of the right common and external iliac artery. She has a known right SFA occlusion with three-vessel runoff. The symptoms do not somewhat improved. Her Dopplers have her maintain stable with a right ABI 0.69, patent iliac stents, left ABI 0.88 with patent iliac stents and moderate disease in the mid left SFA.

## 2016-04-06 NOTE — Assessment & Plan Note (Signed)
History of ischemic cardiopathy with an EF in the 30 or 35% range status post ICD implantation for primary prevention followed by Dr. [name]. She has no signs or symptoms of volume overload.

## 2016-04-06 NOTE — Assessment & Plan Note (Signed)
History of hypertension blood pressure measured at 146/80. She is on metoprolol and ramipril. Continue current meds at current dosing

## 2016-04-06 NOTE — Progress Notes (Signed)
04/06/2016 Andrea Hubbard   [birth year]  [id number]  Primary Physician Maximino Greenland, MD Primary Cardiologist: Lorretta Harp MD Renae Gloss  HPI:  The patient is a very pleasant 70 year old thin appearing married [place] American female, mother of 3 children who recently moved from [place] 2 months ago to retire in [place]. Her daughter went to [employer]. 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 I last saw her in the office 11/26/13.   Her cardiac risk factor profile is positive for a 20+ pack-year history of tobacco abuse, currently smoking one-half pack per week. She has treated hypertension and hyperlipidemia. There is no family history for heart disease. She apparently had a "silent myocardial infarction" in the winter of 2010. Her cardiovascular evaluation began because of symptoms of claudication. She ultimately underwent right carotid endarterectomy for asymptomatic carotid disease, a PCI stent of an artery in her heart, and placement of a prophylactic ICD as well as stenting of her right lower extremity. She denies chest pain or shortness of breath . She does continue to smoke.  She has been followed up with Dr. [name] for her ICD. Recent Dopplers performed of her carotids reveal a widely patent right internal carotid artery endarterectomy site with moderate disease in her left internal carotid artery that has remained stable. She has had some progression of disease in her lower extremities with gradual onset of right lower extremity claudication. I performed angiography on her 12/30/13 stenting her left common iliac, right common and external iliac arteries. I also performed chocolate balloon angioplasty of her mid left SFA resulting. Moderate left lower dissection which L to the left alone. Her symptoms of claudication improved somewhat after this. I have not seen her back since that time although Dr. [name] continues to follow her ICD.  She has stopped smoking 9 months ago. Her recent lipid profile was excellent. She denies chest pain or shortness of breath.    Current Outpatient Prescriptions  Medication Sig Dispense Refill  . aspirin EC 81 MG tablet Take 81 mg by mouth daily.    Marland Kitchen atorvastatin (LIPITOR) 40 MG tablet Take 1 tablet (40 mg total) by mouth daily. 30 tablet 9  . clopidogrel (PLAVIX) 75 MG tablet Take 1 tablet (75 mg total) by mouth daily. NEED OV. 90 tablet 0  . levothyroxine (SYNTHROID, LEVOTHROID) 25 MCG tablet Take 25 mcg by mouth every morning.  2  . metoprolol succinate (TOPROL-XL) 50 MG 24 hr tablet Take 50 mg by mouth daily. Take with or immediately following a meal.    . ramipril (ALTACE) 5 MG capsule Take 5 mg by mouth daily.     No current facility-administered medications for this visit.     Allergies  Allergen Reactions  . Morphine And Related Other (See Comments)    Raises blood pressure, makes hyper    Social History   Social History  . Marital status: Married    Spouse name: N/A  . Number of children: 3  . Years of education: coolege    Occupational History  . 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 Smoking status: Current Some Day Smoker    Packs/day: 0.12    Years: 46.00    Types: Cigarettes  . Smokeless tobacco: Never Used     Comment: 12/30/2013 "down to less than 1 pack cigarettes/wk"  . Alcohol use No  . Drug use: No  .  Sexual activity: Not Currently   Other Topics Concern  . Not on file   Social History Narrative  . No narrative on file     Review of Systems: General: negative for chills, fever, night sweats or weight changes.  Cardiovascular: negative for chest pain, dyspnea on exertion, edema, orthopnea, palpitations, paroxysmal nocturnal dyspnea or shortness of breath Dermatological: negative for rash Respiratory: negative for cough or wheezing Urologic: negative for hematuria Abdominal: negative for nausea, vomiting,  diarrhea, bright red blood per rectum, melena, or hematemesis Neurologic: negative for visual changes, syncope, or dizziness All other systems reviewed and are otherwise negative except as noted above.    Blood pressure (!) 146/80, pulse 64, height 5\' 6"  (1.676 m), weight 144 lb (65.3 kg).  General appearance: alert and no distress Neck: no adenopathy, no JVD, supple, symmetrical, trachea midline, thyroid not enlarged, symmetric, no tenderness/mass/nodules and Bilateral carotid bruits Lungs: clear to auscultation bilaterally Heart: regular rate and rhythm, S1, S2 normal, no murmur, click, rub or gallop Extremities: extremities normal, atraumatic, no cyanosis or edema  EKG not performed today  ASSESSMENT AND PLAN:   CAD (coronary artery disease) History of CAD status post PCI and stenting of her heart remotely. She is on aspirin and Plavix. She denies chest pain or shortness of breath.  Tobacco abuse History of discontinued tobacco abuse approximately 9 months ago.  Cardiomyopathy, ischemic, ejection fraction 30-35%.  [place] History of ischemic cardiopathy with an EF in the 30 or 35% range status post ICD implantation for primary prevention followed by Dr. [name]. She has no signs or symptoms of volume overload.  PAD (peripheral artery disease),  History of peripheral arterial disease status post remote left external iliac artery stenting. I did perform peripheral angiography and intervention on her 12/30/13. I placed a stent in her left common iliac artery about her previously placed external iliac artery stent. She did have diffuse disease in her left SFA with 70% proximal mid with a 90% focal lesion in the mid left SFA with two-vessel runoff. I performed PTCA with a chocolate blue resulting in linear dissection. Ultimately left alone after that. I also performed stenting of the right common and external iliac artery. She has a known right SFA occlusion with  three-vessel runoff. The symptoms do not somewhat improved. Her Dopplers have her maintain stable with a right ABI 0.69, patent iliac stents, left ABI 0.88 with patent iliac stents and moderate disease in the mid left SFA.  Essential hypertension History of hypertension blood pressure measured at 146/80. She is on metoprolol and ramipril. Continue current meds at current dosing  Hyperlipidemia History of hyperlipidemia on statin therapy recent lipid profile performed 04/05/16 revealing total cholesterol of 47, LDL 79 and HDL of 42.  Carotid stenosis History of carotid artery disease status post right carotid endarterectomy for asymptomatic high-grade right ICA stenosis. Her most recent carotid Dopplers performed 03/16/16 revealed a widely patent endarterectomy site with moderate left ICA stenosis. Following this on an annual basis.      Lorretta Harp MD FACP,FACC,FAHA, [facility] 04/06/2016 3:33 PM

## 2016-04-07 DIAGNOSIS — I1310 Hypertensive heart and chronic kidney disease without heart failure, with stage 1 through stage 4 chronic kidney disease, or unspecified chronic kidney disease: Secondary | ICD-10-CM | POA: Diagnosis not present

## 2016-04-07 DIAGNOSIS — I2510 Atherosclerotic heart disease of native coronary artery without angina pectoris: Secondary | ICD-10-CM | POA: Diagnosis not present

## 2016-04-07 DIAGNOSIS — N182 Chronic kidney disease, stage 2 (mild): Secondary | ICD-10-CM | POA: Diagnosis not present

## 2016-04-07 DIAGNOSIS — L309 Dermatitis, unspecified: Secondary | ICD-10-CM | POA: Diagnosis not present

## 2016-04-20 LAB — CUP PACEART INCLINIC DEVICE CHECK
Battery Remaining Longevity: 90 mo
Brady Statistic RV Percent Paced: 0 %
HIGH POWER IMPEDANCE MEASURED VALUE: 55 Ohm
Implantable Lead Implant Date: 20111111
Implantable Lead Location: 753860
Implantable Lead Serial Number: 12345
Lead Channel Setting Pacing Pulse Width: 0.5 ms
MDC IDC LEAD MODEL: 185
MDC IDC MSMT BATTERY REMAINING PERCENTAGE: 97 %
MDC IDC MSMT LEADCHNL RV IMPEDANCE VALUE: 398 Ohm
MDC IDC MSMT LEADCHNL RV PACING THRESHOLD AMPLITUDE: 0.8 V
MDC IDC MSMT LEADCHNL RV PACING THRESHOLD PULSEWIDTH: 0.5 ms
MDC IDC PG SERIAL: 275874
MDC IDC SESS DTM: 20170912130900
MDC IDC SET LEADCHNL RV PACING AMPLITUDE: 2.4 V
MDC IDC SET LEADCHNL RV SENSING SENSITIVITY: 0.3 mV

## 2016-05-10 DIAGNOSIS — Z01419 Encounter for gynecological examination (general) (routine) without abnormal findings: Secondary | ICD-10-CM | POA: Diagnosis not present

## 2016-05-10 DIAGNOSIS — Z124 Encounter for screening for malignant neoplasm of cervix: Secondary | ICD-10-CM | POA: Diagnosis not present

## 2016-05-10 DIAGNOSIS — N951 Menopausal and female climacteric states: Secondary | ICD-10-CM | POA: Diagnosis not present

## 2016-05-10 DIAGNOSIS — N898 Other specified noninflammatory disorders of vagina: Secondary | ICD-10-CM | POA: Diagnosis not present

## 2016-05-10 DIAGNOSIS — Z6823 Body mass index (BMI) 23.0-23.9, adult: Secondary | ICD-10-CM | POA: Diagnosis not present

## 2016-05-11 ENCOUNTER — Other Ambulatory Visit: Payer: Self-pay | Admitting: Cardiovascular Disease

## 2016-05-17 DIAGNOSIS — H6123 Impacted cerumen, bilateral: Secondary | ICD-10-CM | POA: Diagnosis not present

## 2016-05-17 DIAGNOSIS — H608X3 Other otitis externa, bilateral: Secondary | ICD-10-CM | POA: Diagnosis not present

## 2016-05-17 DIAGNOSIS — H900 Conductive hearing loss, bilateral: Secondary | ICD-10-CM | POA: Diagnosis not present

## 2016-05-23 DIAGNOSIS — Z1231 Encounter for screening mammogram for malignant neoplasm of breast: Secondary | ICD-10-CM | POA: Diagnosis not present

## 2016-06-07 ENCOUNTER — Ambulatory Visit (INDEPENDENT_AMBULATORY_CARE_PROVIDER_SITE_OTHER): Payer: Medicare Other | Admitting: Cardiovascular Disease

## 2016-06-07 ENCOUNTER — Encounter: Payer: Self-pay | Admitting: Cardiovascular Disease

## 2016-06-07 VITALS — BP 121/67 | HR 59 | Ht 66.0 in | Wt 142.0 lb

## 2016-06-07 DIAGNOSIS — Z95810 Presence of automatic (implantable) cardiac defibrillator: Secondary | ICD-10-CM | POA: Diagnosis not present

## 2016-06-07 DIAGNOSIS — I2510 Atherosclerotic heart disease of native coronary artery without angina pectoris: Secondary | ICD-10-CM

## 2016-06-07 DIAGNOSIS — I739 Peripheral vascular disease, unspecified: Secondary | ICD-10-CM

## 2016-06-07 DIAGNOSIS — I6523 Occlusion and stenosis of bilateral carotid arteries: Secondary | ICD-10-CM

## 2016-06-07 DIAGNOSIS — E7800 Pure hypercholesterolemia, unspecified: Secondary | ICD-10-CM

## 2016-06-07 DIAGNOSIS — I255 Ischemic cardiomyopathy: Secondary | ICD-10-CM

## 2016-06-07 DIAGNOSIS — I5022 Chronic systolic (congestive) heart failure: Secondary | ICD-10-CM | POA: Diagnosis not present

## 2016-06-07 DIAGNOSIS — I10 Essential (primary) hypertension: Secondary | ICD-10-CM

## 2016-06-07 NOTE — Patient Instructions (Signed)
Dr [name] recommends that you schedule a follow-up appointment in device clinic at [place].  Your physician recommends that you schedule a follow-up appointment in 6 months with Dr C and a defibrillator check. You will receive a reminder letter in the mail two months in advance. If you don't receive a letter, please call our office to schedule the follow-up appointment.  If you need a refill on your cardiac medications before your next appointment, please call your pharmacy.

## 2016-06-07 NOTE — Progress Notes (Signed)
Cardiology Office Note    Date:  06/07/2016   ID:  Andrea Hubbard, DOB [birth year], MRN [id number]  PCP:  Maximino Greenland, MD  Cardiologist:  Quay Burow, M.D.; Sanda Klein, MD   Chief Complaint  Patient presents with  . Follow-up    History of Present Illness:  Andrea Hubbard is a 70 y.o. female here for follow-up on her defibrillator single chamber Therapist, sports implanted November 2011 in [place], primary prevention, no shocks delivered).   Interrogation of her device today shows normal function. Anticipated generator longevity is 7.5 years. No ventricular pacing and a single episode of nonsustained ventricular tachycardia occurring in the last 20 months.  She prefers face-to-face evaluation of her implanted defibrillator, although I think she understands the importance of having a home monitor for emergency situations and ad hoc downloads. Today her appointment was supposed to be in [facility] device clinic, but she was inadvertently scheduled here.  She has an extensive history of coronary artery disease, severe ischemic cardiomyopathy (EF 30% extensive inferior scar) peripheral arterial disease per his bilateral iliac stents July 2015) and carotid artery disease (right carotid endarterectomy), hypertension, hyperlipidemia and previous heavy smoking. She has recently quit smoking. She denies angina either at rest or with exertion and has not had problems with dyspnea at rest or orthopnea or PND. She denies leg edema and does not have classical intermittent claudication.   She saw Dr. Gwenlyn Found in follow-up for PAD in October. She thinks she needs additional attention for her PAD problems and is considering reevaluation by her surgeon in [place].  Past Medical History:  Diagnosis Date  . Automatic implantable cardioverter-defibrillator in situ 2011   [employer]   . Carotid artery disease ([place])   . Coronary artery disease   . Dysrhythmia   .  History of nuclear stress test 04/26/2012   mod-severe perfusion defect in basal inferior septal and basal inf, mid inferoseptal, mid inferio & apical inferior - consistent with infarct/scar; low risk   . Hypercholesteremia   . Hypertension   . ICD (implantable cardioverter-defibrillator) in place 03/08/2016  . PVD (peripheral vascular disease) ([place])   . Silent myocardial infarction 2009; 2010  . Tobacco abuse     Past Surgical History:  Procedure Laterality Date  . ANGIOPLASTY / STENTING FEMORAL Right 2010  . CARDIAC DEFIBRILLATOR PLACEMENT  04/2010  . Carotid Duplex  04/2012   R ECA - severe vessel narrowing with inc velocities 70-99% diameter reduction; L subclav - elevated velocities 50-69% diameter reduction; L vertebral - occlusive disease; L bulb/prox ICA - elevated velocities in prox & mid segments on ICA, 50-69% diameter reduction   . CESAREAN SECTION  1979  . COLONOSCOPY  06/11/2012   Procedure: COLONOSCOPY;  Surgeon: Juanita Craver, MD;  Location: [facility];  Service: Endoscopy;  Laterality: N/A;  . ENDARTERECTOMY Right 2010  . ILIAC ARTERY STENT Bilateral 12/30/2013  . LOWER EXTREMITY ANGIOGRAM Bilateral 12/30/2013   Procedure: LOWER EXTREMITY ANGIOGRAM;  Surgeon: Lorretta Harp, MD;  Location[facility] C[facility] LAB;  Service: Cardiovascular;  Laterality: Bilateral;  . Lower Extremity Arterial Doppler  04/2012   ABI 0.63 on R and 0.77 on L  . PERCUTANEOUS CORONARY STENT INTERVENTION (PCI-S)     in [place]  . TRANSTHORACIC ECHOCARDIOGRAM  04/26/2012   EF 30-35%; imparied LV relaxation; mod post wall hypokinesis, mod-severe ant wall kypokinesis and severe septal hypokinesis; RV systolic function mod redcued, with RV systolic pressure elevated; mild AVR  Current Medications: Outpatient Medications Prior to Visit  Medication Sig Dispense Refill  . aspirin EC 81 MG tablet Take 81 mg by mouth daily.    Marland Kitchen atorvastatin (LIPITOR) 40 MG tablet Take 1 tablet (40 mg total) by mouth daily. 30  tablet 9  . clopidogrel (PLAVIX) 75 MG tablet TAKE 1 TABLET DAILY 90 tablet 3  . levothyroxine (SYNTHROID, LEVOTHROID) 25 MCG tablet Take 25 mcg by mouth every morning.  2  . metoprolol succinate (TOPROL-XL) 50 MG 24 hr tablet Take 50 mg by mouth daily. Take with or immediately following a meal.    . ramipril (ALTACE) 5 MG capsule Take 5 mg by mouth daily.     No facility-administered medications prior to visit.      Allergies:   Morphine and related   Social History   Social History  . Marital status: Married    Spouse name: N/A  . Number of children: 3  . Years of education: coolege    Occupational History  . 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 Smoking status: Current Some Day Smoker    Packs/day: 0.12    Years: 46.00    Types: Cigarettes  . Smokeless tobacco: Never Used     Comment: 12/30/2013 "down to less than 1 pack cigarettes/wk"  . Alcohol use No  . Drug use: No  . Sexual activity: Not Currently   Other Topics Concern  . None   Social History Narrative  . None     Family History:  The patient's family history includes Diabetes in her maternal grandmother; Hypertension in her son; Leukemia in her mother; Pancreatic cancer in her father; Rheum arthritis in her sister; Stroke in her paternal grandfather; Sudden death in her maternal grandfather.   ROS:   Please see the history of present illness.    ROS All other systems reviewed and are negative.   PHYSICAL EXAM:   VS:  BP 121/67   Pulse (!) 59   Ht 5\' 6"  (1.676 m)   Wt 142 lb (64.4 kg)   BMI 22.92 kg/m    GEN: Well nourished, well developed, in no acute distress  HEENT: normal  Neck: no JVD, loud bilateral carotid bruits, no goiter or masses Cardiac: RRR; no murmurs, rubs, or gallops,no edema, healthy left subclavian defibrillator site. Both feet are warm but I cannot detect pedal pulses in either foot. She has normal capillary refill. Radial pulses are normal. Bilateral  femoral bruits.  Respiratory:  clear to auscultation bilaterally, normal work of breathing GI: soft, nontender, nondistended, + BS MS: no deformity or atrophy  Skin: warm and dry, no rash Neuro:  Alert and Oriented x 3, Strength and sensation are intact Psych: euthymic mood, full affect  Wt Readings from Last 3 Encounters:  06/07/16 142 lb (64.4 kg)  04/06/16 144 lb (65.3 kg)  03/08/16 145 lb 3.2 oz (65.9 kg)      Studies/Labs Reviewed:   EKG:  EKG is ordered today.  The ekg ordered today demonstrates Normal sinus rhythm with frequent PVCs (3 seen on the 10 second strip). QS pattern in leads V1-V4 consistent with old anteroseptal infarction. Inferior Q waves with mild ST segment elevation in leads 2, 3, aVF consistent with a "frozen infarct" pattern. There is chronic T-wave inversion in leads V3-V6 as well as the inferior leads.  LABS: October 2017 potassium 4.3, creatinine 0.88, normal liver function tests, glucose 99 Total cholesterol 147, triglycerides 130,  HDL 42, LDL 79  ASSESSMENT:    1. Chronic systolic heart failure ([place])   2. ICD (implantable cardioverter-defibrillator) in place   3. Coronary artery disease involving native coronary artery of native heart without angina pectoris   4. PAD (peripheral artery disease),    5. Bilateral carotid artery stenosis   6. Pure hypercholesterolemia   7. Essential hypertension      PLAN:  In order of problems listed above:  1. CHF: Despite markedly depressed LVEF, she has no signs of hypervolemia and has NYHA class I functional status. She is on appropriate ACE inhibitor and beta blocker therapy. Her diastolic blood pressure is relatively low it is doubtful she would tolerate higher doses. We did discuss Entresto today, but since she has class I symptoms of decided not to transition to this agent at this time. 2. ICD: Normal device function. [facility] device clinic checks and alternating office visits with me every 3 months. I  would still like her to have a home monitor/transmitter. 3. CAD: She does not have angina pectoris. On chronic dual antiplatelet therapy and statin. 2013 nuclear stress test showed a large inferior wall scar but no reversible ischemia. 4. PAD: Her symptoms sound more like right sciatica due to lumbar spine disease to me, but she does not have any pulses in her feet. Right side ABI 0.69, left side ABI 0.88. Dr. Gwenlyn Found did not recommend further intervention. She is considering a second opinion from her specialist in [place]. 5. Carotid stenosis: Status post right carotid endarterectomy. 60-79% stenosis on the right, plan to reevaluate in 1 year. She does not have neurological symptoms and is on dual antiplatelet therapy. 6. HLP: On statin. Fair lipid profile although LDL not quite at target. Prefers not to increase the statin further. 7. HTN: Excellent control     Medication Adjustments/Labs and Tests Ordered: Current medicines are reviewed at length with the patient today.  Concerns regarding medicines are outlined above.  Medication changes, Labs and Tests ordered today are listed in the Patient Instructions below. Patient Instructions  Dr [name] recommends that you schedule a follow-up appointment in device clinic at [place].  Your physician recommends that you schedule a follow-up appointment in 6 months with Dr C and a defibrillator check. You will receive a reminder letter in the mail two months in advance. If you don't receive a letter, please call our office to schedule the follow-up appointment.  If you need a refill on your cardiac medications before your next appointment, please call your pharmacy.    Signed, Sanda Klein, MD  06/07/2016 2:24 PM    [address], [place], [place]  [address] Phone: [phone]; Fax: [phone]

## 2016-07-06 LAB — CUP PACEART INCLINIC DEVICE CHECK
Date Time Interrogation Session: 20180110101407
Implantable Lead Model: 185
Lead Channel Sensing Intrinsic Amplitude: 9.9 mV
MDC IDC LEAD IMPLANT DT: 20111111
MDC IDC LEAD LOCATION: 753860
MDC IDC LEAD SERIAL: 12345
MDC IDC PG IMPLANT DT: 20111111
MDC IDC PG SERIAL: 275874

## 2016-08-03 DIAGNOSIS — Z23 Encounter for immunization: Secondary | ICD-10-CM | POA: Diagnosis not present

## 2016-09-06 ENCOUNTER — Encounter: Payer: Medicare Other | Admitting: *Deleted

## 2016-09-06 ENCOUNTER — Telehealth: Payer: Self-pay | Admitting: Cardiology

## 2016-09-06 NOTE — Telephone Encounter (Signed)
LMOVM reminding pt to send remote transmission.   

## 2016-09-09 ENCOUNTER — Encounter: Payer: Self-pay | Admitting: Cardiology

## 2017-05-05 ENCOUNTER — Other Ambulatory Visit: Payer: Self-pay | Admitting: Cardiovascular Disease

## 2017-12-26 ENCOUNTER — Encounter: Payer: Self-pay | Admitting: Cardiology

## 2018-04-30 ENCOUNTER — Other Ambulatory Visit: Payer: Self-pay | Admitting: Cardiovascular Disease

## 2018-05-08 ENCOUNTER — Telehealth: Payer: Self-pay | Admitting: Internal Medicine

## 2018-05-08 NOTE — Telephone Encounter (Signed)
3rd attempt to reach the patient and schedule AWV.  Also left messages 11/10/17 and 11/27/17. VDM (DD)

## 2018-06-08 DIAGNOSIS — Z23 Encounter for immunization: Secondary | ICD-10-CM | POA: Diagnosis not present

## 2018-06-21 ENCOUNTER — Ambulatory Visit (INDEPENDENT_AMBULATORY_CARE_PROVIDER_SITE_OTHER): Payer: Medicare Other | Admitting: Cardiovascular Disease

## 2018-06-21 ENCOUNTER — Encounter: Payer: Self-pay | Admitting: Cardiovascular Disease

## 2018-06-21 ENCOUNTER — Telehealth: Payer: Self-pay

## 2018-06-21 VITALS — BP 128/84 | HR 86 | Ht 66.0 in | Wt 137.0 lb

## 2018-06-21 DIAGNOSIS — I2510 Atherosclerotic heart disease of native coronary artery without angina pectoris: Secondary | ICD-10-CM

## 2018-06-21 DIAGNOSIS — I739 Peripheral vascular disease, unspecified: Secondary | ICD-10-CM | POA: Diagnosis not present

## 2018-06-21 DIAGNOSIS — I255 Ischemic cardiomyopathy: Secondary | ICD-10-CM | POA: Diagnosis not present

## 2018-06-21 DIAGNOSIS — R079 Chest pain, unspecified: Secondary | ICD-10-CM | POA: Diagnosis not present

## 2018-06-21 DIAGNOSIS — E7800 Pure hypercholesterolemia, unspecified: Secondary | ICD-10-CM

## 2018-06-21 DIAGNOSIS — Z720 Tobacco use: Secondary | ICD-10-CM | POA: Diagnosis not present

## 2018-06-21 LAB — LIPID PANEL
Chol/HDL Ratio: 4.2 ratio (ref 0.0–4.4)
Cholesterol, Total: 182 mg/dL (ref 100–199)
HDL: 43 mg/dL (ref >39)
LDL Calculated: 119 mg/dL — ABNORMAL HIGH (ref 0–99)
Triglycerides: 99 mg/dL (ref 0–149)
VLDL Cholesterol Cal: 20 mg/dL (ref 5–40)

## 2018-06-21 LAB — HEPATIC FUNCTION PANEL
ALT: 33 IU/L — ABNORMAL HIGH (ref 0–32)
AST: 32 IU/L (ref 0–40)
Albumin: 3.8 g/dL (ref 3.5–4.8)
Alkaline Phosphatase: 87 IU/L (ref 39–117)
Bilirubin Total: 0.8 mg/dL (ref 0.0–1.2)
Bilirubin, Direct: 0.25 mg/dL (ref 0.00–0.40)
Total Protein: 5.9 g/dL — ABNORMAL LOW (ref 6.0–8.5)

## 2018-06-21 NOTE — Assessment & Plan Note (Signed)
History of CAD status post stenting in [place] back in 2010 with what appears to be ischemic cardiomyopathy.  Her last echo was in 2013 that revealed an EF of 30 to 35% with severe anterior wall hypokinesia.  Over the last 3 weeks she is noticed markedly increased dyspnea on exertion but denies chest pain.  I am going to get a 2D echocardiogram and a pharmacologic Myoview stress test to further evaluate.

## 2018-06-21 NOTE — Assessment & Plan Note (Signed)
History of remote carotid endarterectomy in [place] in 2010.  Her last carotid Dopplers performed 03/16/2016 revealed her carotid endarterectomy site to be widely patent with moderate left ICA stenosis.  Will repeat carotid Doppler studies.

## 2018-06-21 NOTE — Assessment & Plan Note (Signed)
Discontinue tobacco abuse a year ago

## 2018-06-21 NOTE — Assessment & Plan Note (Signed)
History of hyperlipidemia on statin therapy with lipid profile performed 04/05/2016 revealing total cholesterol 147, LDL 79 HDL 42.

## 2018-06-21 NOTE — Patient Instructions (Addendum)
Medication Instructions:  Your physician recommends that you continue on your current medications as directed. Please refer to the Current Medication list given to you today.  If you need a refill on your cardiac medications before your next appointment, please call your pharmacy.   Lab work: Your physician recommends that you return for lab work as soon as possible: LIPID/LIVER  If you have labs (blood work) drawn today and your tests are completely normal, you will receive your results only by: Marland Kitchen MyChart Message (if you have MyChart) OR . A paper copy in the mail If you have any lab test that is abnormal or we need to change your treatment, we will call you to review the results.  Testing/Procedures: Your physician has requested that you have a lower or upper extremity arterial duplex. This test is an ultrasound of the arteries in the legs or arms. It looks at arterial blood flow in the legs and arms. Allow one hour for Lower and Upper Arterial scans. There are no restrictions or special instructions  Your physician has requested that you have an ankle brachial index (ABI). During this test an ultrasound and blood pressure cuff are used to evaluate the arteries that supply the arms and legs with blood. Allow thirty minutes for this exam. There are no restrictions or special instructions.  Your physician has requested that you have a carotid duplex. This test is an ultrasound of the carotid arteries in your neck. It looks at blood flow through these arteries that supply the brain with blood. Allow one hour for this exam. There are no restrictions or special instructions.   Your physician has requested that you have an echocardiogram. Echocardiography is a painless test that uses sound waves to create images of your heart. It provides your doctor with information about the size and shape of your heart and how well your heart's chambers and valves are working. This procedure takes approximately  one hour. There are no restrictions for this procedure. SCHEDULE WITHIN 1 WEEK  Your physician has requested that you have a lexiscan myoview. For further information please visit [email or web address]. Please follow instruction sheet, as given.  SCHEDULE WITHIN 1 WEEK   Follow-Up: At [facility], you and your health needs are our priority.  As part of our continuing mission to provide you with exceptional heart care, we have created designated Provider Care Teams.  These Care Teams include your primary Cardiologist (physician) and Advanced Practice Providers (APPs -  Physician Assistants and Nurse Practitioners) who all work together to provide you with the care you need, when you need it. You will need a follow up appointment in 1 MONTH. You may see DR. BERRY or one of the following Advanced Practice Providers on your designated Care Team:   Kerin Ransom, PA-C [place], [place] . Sande Rives, PA-C  Any Other Special Instructions Will Be Listed Below (If Applicable). REFERRAL TO CHF CLINIC

## 2018-06-21 NOTE — Assessment & Plan Note (Signed)
History of remote ICD implantation followed by Dr. [name].  This was apparently put in 10 years ago.

## 2018-06-21 NOTE — Assessment & Plan Note (Signed)
History of PAD status post remote left iliac stenting in [place].  I intervened on her 12/30/2013 and placed a stent in her right common and external iliac artery as well as in her left common iliac artery.  I demonstrated an occluded left right SFA with high-grade mid left SFA stenosis which I angioplastied with a "chocolate balloon.  She had two-vessel runoff on the left and 3 on the right.  Her last lower extremity arterial Doppler studies were performed in our office 03/16/2016 revealing a right ABI 0.69, left of 0.88 with patent iliac stents.  She really denies claudication except when walking upstairs.

## 2018-06-21 NOTE — Assessment & Plan Note (Signed)
History of ischemic cardiomyopathy status post remote anterior wall MI on metoprolol and ramipril.  Her last EF was measured approximately 7 years ago was 30 to 35%.  She does have an ICD in place which has never discharged.  She is on metoprolol and ramipril.  Obtain a repeat a 2D echocardiogram and refer her to the advanced heart failure clinic for pharmacologic optimization.

## 2018-06-21 NOTE — Assessment & Plan Note (Signed)
History of essential hypertension her blood pressure measured today 128/84.  She is on metoprolol and ramipril.

## 2018-06-21 NOTE — Telephone Encounter (Signed)
Per Dr. Gwenlyn Found, pt to be referred to Pharm[facility] for [facility]; Rodriguez-Guzman, [place] made aware

## 2018-06-21 NOTE — Progress Notes (Signed)
06/21/2018 Andrea Hubbard   1945-08-08  [id number]  Primary Physician Glendale Chard, MD Primary Cardiologist: Lorretta Harp MD Andrea Hubbard, [place]  HPI:  Andrea Hubbard is a 72 y.o.  old thin appearing married [place] American female, mother of 3 children who recently moved from [place] 2 months ago to retire in [place]. Her daughter went to [employer]. 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 I last saw her in the office 04/06/2016.   Her cardiac risk factor profile is positive for a 20+ pack-year history of tobacco abuse, currently smoking one-half pack per week. She has treated hypertension and hyperlipidemia. There is no family history for heart disease. She apparently had a "silent myocardial infarction" in the winter of 2010. Her cardiovascular evaluation began because of symptoms of claudication. She ultimately underwent right carotid endarterectomy for asymptomatic carotid disease, a PCI stent of an artery in her heart, and placement of a prophylactic ICD as well as stenting of her right lower extremity. She denies chest pain or shortness of breath . She does continue to smoke.  She has been followed up with Dr. [name] for her ICD.  Her last Dopplers performed of her carotids performed 03/16/2016 reveal a widely patent right internal carotid artery endarterectomy site with moderate disease in her left internal carotid artery that has remained stable. She has had some progression of disease in her lower extremities with gradual onset of right lower extremity claudication. I performed angiography on her 12/30/13 stenting her left common iliac, right common and external iliac arteries. I also performed chocolate balloon angioplasty of her mid left SFA resulting. Moderate left lower dissection which L to the left alone. Her symptoms of claudication improved somewhat after this. I have not seen her back since that time although Dr. [name] continues to  follow her ICD. She has stopped smoking 12 months ago. Her recent lipid profile was excellent.   Since I saw her 2 years ago she is noticed marked increasing dyspnea on exertion of the last 3 weeks but denies chest pain.  She also currently feels some palpitations in her chest.  She is never had an ICD discharge.  She denies claudication as well.  Current Meds  Medication Sig  . aspirin EC 81 MG tablet Take 81 mg by mouth daily.  Marland Kitchen atorvastatin (LIPITOR) 40 MG tablet Take 1 tablet (40 mg total) by mouth daily.  . clopidogrel (PLAVIX) 75 MG tablet TAKE 1 TABLET DAILY  . levothyroxine (SYNTHROID, LEVOTHROID) 25 MCG tablet Take 25 mcg by mouth every morning.  . metoprolol succinate (TOPROL-XL) 50 MG 24 hr tablet Take 50 mg by mouth daily. Take with or immediately following a meal.  . ramipril (ALTACE) 5 MG capsule Take 5 mg by mouth daily.     Allergies  Allergen Reactions  . Morphine And Related Other (See Comments)    Raises blood pressure, makes hyper    Social History   Socioeconomic History  . Marital status: Married    Spouse name: Not on file  . Number of children: 3  . Years of education: coolege   . Highest education level: Not on file  Occupational History  . 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 Financial resource strain: Not on file  . Food insecurity:    Worry: Not on file    Inability: Not on file  . Transportation needs:    Medical: Not on  file    Non-medical: Not on file  Tobacco Use  . Smoking status: Current Some Day Smoker    Packs/day: 0.12    Years: 46.00    Pack years: 5.52    Types: Cigarettes  . Smokeless tobacco: Never Used  . Tobacco comment: 12/30/2013 "down to less than 1 pack cigarettes/wk"  Substance and Sexual Activity  . Alcohol use: No  . Drug use: No  . Sexual activity: Not Currently  Lifestyle  . Physical activity:    Days per week: Not on file    Minutes per session: Not on file  . Stress: Not on file    Relationships  . Social connections:    Talks on phone: Not on file    Gets together: Not on file    Attends religious service: Not on file    Active member of club or organization: Not on file    Attends meetings of clubs or organizations: Not on file    Relationship status: Not on file  . Intimate partner violence:    Fear of current or ex partner: Not on file    Emotionally abused: Not on file    Physically abused: Not on file    Forced sexual activity: Not on file  Other Topics Concern  . Not on file  Social History Narrative  . Not on file     Review of Systems: General: negative for chills, fever, night sweats or weight changes.  Cardiovascular: negative for chest pain, dyspnea on exertion, edema, orthopnea, palpitations, paroxysmal nocturnal dyspnea or shortness of breath Dermatological: negative for rash Respiratory: negative for cough or wheezing Urologic: negative for hematuria Abdominal: negative for nausea, vomiting, diarrhea, bright red blood per rectum, melena, or hematemesis Neurologic: negative for visual changes, syncope, or dizziness All other systems reviewed and are otherwise negative except as noted above.    Blood pressure 128/84, pulse 86, height 5\' 6"  (1.676 m), weight 137 lb (62.1 kg).  General appearance: alert and no distress Neck: no adenopathy, no JVD, supple, symmetrical, trachea midline, thyroid not enlarged, symmetric, no tenderness/mass/nodules and Bilateral carotid bruits Lungs: clear to auscultation bilaterally Heart: regular rate and rhythm, S1, S2 normal, no murmur, click, rub or gallop Extremities: extremities normal, atraumatic, no cyanosis or edema Pulses: Absent pedal pulses Skin: Skin color, texture, turgor normal. No rashes or lesions Neurologic: Alert and oriented X 3, normal strength and tone. Normal symmetric reflexes. Normal coordination and gait  EKG sinus rhythm 86 with anterior Q waves inferior Q waves as well as low limb  voltage.  She has inferolateral T wave inversion.  I personally reviewed this EKG.  ASSESSMENT AND PLAN:   CAD (coronary artery disease) History of CAD status post stenting in [place] back in 2010 with what appears to be ischemic cardiomyopathy.  Her last echo was in 2013 that revealed an EF of 30 to 35% with severe anterior wall hypokinesia.  Over the last 3 weeks she is noticed markedly increased dyspnea on exertion but denies chest pain.  I am going to get a 2D echocardiogram and a pharmacologic Myoview stress test to further evaluate.  Tobacco abuse Discontinue tobacco abuse a year ago  Cardiomyopathy, ischemic, ejection fraction 30-35%.  [place] History of ischemic cardiomyopathy status post remote anterior wall MI on metoprolol and ramipril.  Her last EF was measured approximately 7 years ago was 30 to 35%.  She does have an ICD in place which has never discharged.  She  is on metoprolol and ramipril.  Obtain a repeat a 2D echocardiogram and refer her to the advanced heart failure clinic for pharmacologic optimization.  PAD (peripheral artery disease),  History of PAD status post remote left iliac stenting in [place].  I intervened on her 12/30/2013 and placed a stent in her right common and external iliac artery as well as in her left common iliac artery.  I demonstrated an occluded left right SFA with high-grade mid left SFA stenosis which I angioplastied with a "chocolate balloon.  She had two-vessel runoff on the left and 3 on the right.  Her last lower extremity arterial Doppler studies were performed in our office 03/16/2016 revealing a right ABI 0.69, left of 0.88 with patent iliac stents.  She really denies claudication except when walking upstairs.  Hyperlipidemia History of hyperlipidemia on statin therapy with lipid profile performed 04/05/2016 revealing total cholesterol 147, LDL 79 HDL 42.  Essential hypertension History of essential hypertension her  blood pressure measured today 128/84.  She is on metoprolol and ramipril.  ICD (implantable cardioverter-defibrillator) in place History of remote ICD implantation followed by Dr. [name].  This was apparently put in 10 years ago.  Carotid stenosis History of remote carotid endarterectomy in [place] in 2010.  Her last carotid Dopplers performed 03/16/2016 revealed her carotid endarterectomy site to be widely patent with moderate left ICA stenosis.  Will repeat carotid Doppler studies.      Lorretta Harp MD FACP,FACC,FAHA, [facility] 06/21/2018 10:30 AM

## 2018-06-22 ENCOUNTER — Other Ambulatory Visit: Payer: Self-pay

## 2018-06-22 ENCOUNTER — Ambulatory Visit (HOSPITAL_COMMUNITY): Payer: Medicare Other | Attending: Cardiovascular Disease

## 2018-06-22 ENCOUNTER — Ambulatory Visit (INDEPENDENT_AMBULATORY_CARE_PROVIDER_SITE_OTHER): Payer: Medicare Other | Admitting: Nurse Practitioner

## 2018-06-22 ENCOUNTER — Encounter: Payer: Self-pay | Admitting: Nurse Practitioner

## 2018-06-22 VITALS — BP 124/76 | HR 84 | Temp 97.3°F | Ht 65.8 in | Wt 136.2 lb

## 2018-06-22 DIAGNOSIS — I10 Essential (primary) hypertension: Secondary | ICD-10-CM

## 2018-06-22 DIAGNOSIS — E039 Hypothyroidism, unspecified: Secondary | ICD-10-CM

## 2018-06-22 DIAGNOSIS — I2510 Atherosclerotic heart disease of native coronary artery without angina pectoris: Secondary | ICD-10-CM | POA: Diagnosis not present

## 2018-06-22 DIAGNOSIS — I739 Peripheral vascular disease, unspecified: Secondary | ICD-10-CM

## 2018-06-22 DIAGNOSIS — Z9114 Patient's other noncompliance with medication regimen: Secondary | ICD-10-CM

## 2018-06-22 DIAGNOSIS — Z95810 Presence of automatic (implantable) cardiac defibrillator: Secondary | ICD-10-CM | POA: Diagnosis not present

## 2018-06-22 DIAGNOSIS — Z760 Encounter for issue of repeat prescription: Secondary | ICD-10-CM

## 2018-06-22 DIAGNOSIS — T829XXS Unspecified complication of cardiac and vascular prosthetic device, implant and graft, sequela: Secondary | ICD-10-CM

## 2018-06-22 DIAGNOSIS — Z9119 Patient's noncompliance with other medical treatment and regimen: Secondary | ICD-10-CM

## 2018-06-22 LAB — ECHOCARDIOGRAM COMPLETE
Height: 65.8 in
Weight: 2179.2 oz

## 2018-06-22 MED ORDER — RAMIPRIL 5 MG PO CAPS: 5.0000 mg | ORAL_CAPSULE | Freq: Every day | ORAL | 1 refills | Status: DC

## 2018-06-22 MED ORDER — ROSUVASTATIN CALCIUM 10 MG PO TABS: 10.0000 mg | ORAL_TABLET | Freq: Every day | ORAL | 1 refills | Status: DC

## 2018-06-22 MED ORDER — METOPROLOL SUCCINATE ER 50 MG PO TB24: 50.0000 mg | ORAL_TABLET | Freq: Every day | ORAL | 1 refills | Status: DC

## 2018-06-22 NOTE — Progress Notes (Addendum)
Subjective:     Patient ID: Andrea Hubbard , female    DOB: 1945/10/16 , 72 y.o.   MRN: [id number]   Chief Complaint  Patient presents with  . Hypertension    HPI  She is returning from being out of town since 2017, she has not been seeing any primary care providers since that time and needs to have her medications refilled.  She has been out of her medications for the last 3 months.  She had only been taking her aspirin and plavix.  She is feeling light headed and unable to walk in a straight line.   She is scheduled for an ECHO, carotid doppler and venous doppler ordered by Dr. Gwenlyn Found whom see seen yesterday for shortness of breath and feeling like she is not getting enough "oxygen" to her brain.    Hypertension  This is a chronic problem. The current episode started more than 1 year ago. Pertinent negatives include no anxiety, blurred vision, chest pain, headaches, malaise/fatigue, palpitations or shortness of breath. There are no associated agents to hypertension. There are no known risk factors for coronary artery disease. Past treatments include ACE inhibitors and beta blockers. There are no compliance problems.  There is no history of angina or kidney disease. There is no history of chronic renal disease. Hypercortisolism: tsh.     Past Medical History:  Diagnosis Date  . Automatic implantable cardioverter-defibrillator in situ 2011   [employer]   . Carotid artery disease ([place])   . Coronary artery disease   . Dysrhythmia   . History of nuclear stress test 04/26/2012   mod-severe perfusion defect in basal inferior septal and basal inf, mid inferoseptal, mid inferio & apical inferior - consistent with infarct/scar; low risk   . Hypercholesteremia   . Hypertension   . ICD (implantable cardioverter-defibrillator) in place 03/08/2016  . PVD (peripheral vascular disease) ([place])   . Silent myocardial infarction ([place]) 2009; 2010  . Tobacco abuse      Family History   Problem Relation Age of Onset  . Leukemia Mother   . Pancreatic cancer Father   . Diabetes Maternal Grandmother   . Sudden death Maternal Grandfather   . Stroke Paternal Grandfather   . Rheum arthritis Sister   . Hypertension Son      Current Outpatient Medications:  .  aspirin EC 81 MG tablet, Take 81 mg by mouth daily., Disp: , Rfl:  .  clopidogrel (PLAVIX) 75 MG tablet, TAKE 1 TABLET DAILY, Disp: 90 tablet, Rfl: 0 .  levothyroxine (SYNTHROID, LEVOTHROID) 25 MCG tablet, Take 25 mcg by mouth every morning., Disp: , Rfl: 2 .  metoprolol succinate (TOPROL-XL) 50 MG 24 hr tablet, Take 50 mg by mouth daily. Take with or immediately following a meal., Disp: , Rfl:  .  ramipril (ALTACE) 5 MG capsule, Take 5 mg by mouth daily., Disp: , Rfl:  .  rosuvastatin (CRESTOR) 10 MG tablet, Take 10 mg by mouth daily., Disp: , Rfl:    Allergies  Allergen Reactions  . Morphine And Related Other (See Comments)    Raises blood pressure, makes hyper     Review of Systems  Constitutional: Negative.  Negative for fatigue and malaise/fatigue.  Eyes: Negative for blurred vision and visual disturbance.  Respiratory: Negative.  Negative for shortness of breath.   Cardiovascular: Negative.  Negative for chest pain, palpitations and leg swelling.  Gastrointestinal: Negative.   Endocrine: Negative.  Negative for polydipsia, polyphagia and polyuria.  Musculoskeletal: Negative.  Skin: Negative.   Neurological: Negative for dizziness, weakness and headaches.  Psychiatric/Behavioral: Negative for confusion. The patient is not nervous/anxious.      Today's Vitals   06/22/18 0928  BP: 124/76  Pulse: 84  Temp: (!) 97.3 F (36.3 C)  TempSrc: Oral  Weight: 136 lb 3.2 oz (61.8 kg)  Height: 5' 5.8" (1.671 m)  PainSc: 0-No pain   Body mass index is 22.12 kg/m.   Objective:  Physical Exam Vitals signs reviewed.  Constitutional:      Appearance: She is well-developed.  HENT:     Mouth/Throat:      Mouth: Mucous membranes are moist.  Eyes:     Pupils: Pupils are equal, round, and reactive to light.  Cardiovascular:     Rate and Rhythm: Normal rate and regular rhythm.     Heart sounds: Normal heart sounds. No murmur.  Pulmonary:     Effort: Pulmonary effort is normal.     Breath sounds: Normal breath sounds.  Musculoskeletal: Normal range of motion.  Skin:    General: Skin is warm and dry.     Capillary Refill: Capillary refill takes less than 2 seconds.  Neurological:     General: No focal deficit present.     Mental Status: She is alert and oriented to person, place, and time.     Cranial Nerves: No cranial nerve deficit.  Psychiatric:        Mood and Affect: Mood normal.         Assessment And Plan:     1. Acquired hypothyroidism  She had been on levothyroxine in 2016 but has not been taking any medication in over 2 years.   Will recheck levels today - T3, free - T4 - TSH  2. Coronary artery disease involving native coronary artery of native heart without angina pectoris  Chronic  Being followed by Dr. Gwenlyn Found - CMP14 + Anion Gap - CBC - metoprolol succinate (TOPROL-XL) 50 MG 24 hr tablet; Take 1 tablet (50 mg total) by mouth daily. Take with or immediately following a meal.  Dispense: 90 tablet; Refill: 1 - ramipril (ALTACE) 5 MG capsule; Take 1 capsule (5 mg total) by mouth daily.  Dispense: 90 capsule; Refill: 1 - rosuvastatin (CRESTOR) 10 MG tablet; Take 1 tablet (10 mg total) by mouth daily.  Dispense: 90 tablet; Refill: 1  3. Disorder of implantable defibrillator, sequela  She is to have a new battery placed   Followed by Dr. Gwenlyn Found  4. Essential hypertension . B/P is well controlled in spite of not having her medication regularly  . CMP ordered to check renal function.  . Will hold off on medication at this time she is very regular     Minette Brine, FNP

## 2018-06-23 LAB — CMP14 + ANION GAP
ALBUMIN: 3.6 g/dL (ref 3.5–4.8)
ALK PHOS: 77 IU/L (ref 39–117)
ALT: 31 IU/L (ref 0–32)
AST: 29 IU/L (ref 0–40)
Albumin/Globulin Ratio: 1.8 (ref 1.2–2.2)
Anion Gap: 15 mmol/L (ref 10.0–18.0)
BUN / CREAT RATIO: 19 (ref 12–28)
BUN: 17 mg/dL (ref 8–27)
Bilirubin Total: 0.7 mg/dL (ref 0.0–1.2)
CO2: 21 mmol/L (ref 20–29)
Calcium: 9 mg/dL (ref 8.7–10.3)
Chloride: 105 mmol/L (ref 96–106)
Creatinine, Ser: 0.89 mg/dL (ref 0.57–1.00)
GFR calc Af Amer: 75 mL/min/{1.73_m2} (ref >59)
GFR calc non Af Amer: 65 mL/min/{1.73_m2} (ref >59)
GLOBULIN, TOTAL: 2 g/dL (ref 1.5–4.5)
Glucose: 98 mg/dL (ref 65–99)
Potassium: 4.4 mmol/L (ref 3.5–5.2)
SODIUM: 141 mmol/L (ref 134–144)
Total Protein: 5.6 g/dL — ABNORMAL LOW (ref 6.0–8.5)

## 2018-06-23 LAB — CBC
Hematocrit: 46.9 % — ABNORMAL HIGH (ref 34.0–46.6)
Hemoglobin: 14.8 g/dL (ref 11.1–15.9)
MCH: 27.7 pg (ref 26.6–33.0)
MCHC: 31.6 g/dL (ref 31.5–35.7)
MCV: 88 fL (ref 79–97)
Platelets: 190 10*3/uL (ref 150–450)
RBC: 5.34 x10E6/uL — AB (ref 3.77–5.28)
RDW: 13 % (ref 12.3–15.4)
WBC: 7.5 10*3/uL (ref 3.4–10.8)

## 2018-06-23 LAB — T3, FREE: T3 FREE: 2.8 pg/mL (ref 2.0–4.4)

## 2018-06-23 LAB — TSH: TSH: 7.74 u[IU]/mL — AB (ref 0.450–4.500)

## 2018-06-23 LAB — T4: T4, Total: 7 ug/dL (ref 4.5–12.0)

## 2018-06-25 ENCOUNTER — Other Ambulatory Visit: Payer: Self-pay | Admitting: Nurse Practitioner

## 2018-06-25 ENCOUNTER — Encounter: Payer: Self-pay | Admitting: *Deleted

## 2018-06-25 ENCOUNTER — Telehealth: Payer: Self-pay

## 2018-06-25 DIAGNOSIS — E039 Hypothyroidism, unspecified: Secondary | ICD-10-CM

## 2018-06-25 MED ORDER — LEVOTHYROXINE SODIUM 25 MCG PO TABS: 25.0000 ug | ORAL_TABLET | Freq: Every day | ORAL | 2 refills | Status: DC

## 2018-06-25 NOTE — Telephone Encounter (Signed)
LMTCB

## 2018-06-26 ENCOUNTER — Telehealth (HOSPITAL_COMMUNITY): Payer: Self-pay

## 2018-06-26 NOTE — Telephone Encounter (Signed)
Encounter complete. 

## 2018-06-27 ENCOUNTER — Other Ambulatory Visit: Payer: Self-pay

## 2018-06-27 ENCOUNTER — Encounter (HOSPITAL_COMMUNITY): Payer: Self-pay | Admitting: *Deleted

## 2018-06-27 ENCOUNTER — Emergency Department (HOSPITAL_COMMUNITY): Payer: Medicare Other

## 2018-06-27 ENCOUNTER — Inpatient Hospital Stay (HOSPITAL_COMMUNITY)
Admission: EM | Admit: 2018-06-27 | Discharge: 2018-06-30 | DRG: 287 | Disposition: A | Discharge status: Home / Self Care | Payer: Medicare Other | Attending: Cardiology | Admitting: Cardiology

## 2018-06-27 DIAGNOSIS — I472 Ventricular tachycardia: Secondary | ICD-10-CM | POA: Diagnosis present

## 2018-06-27 DIAGNOSIS — Z23 Encounter for immunization: Secondary | ICD-10-CM

## 2018-06-27 DIAGNOSIS — Z79890 Hormone replacement therapy (postmenopausal): Secondary | ICD-10-CM

## 2018-06-27 DIAGNOSIS — I2510 Atherosclerotic heart disease of native coronary artery without angina pectoris: Secondary | ICD-10-CM | POA: Diagnosis present

## 2018-06-27 DIAGNOSIS — Z95810 Presence of automatic (implantable) cardiac defibrillator: Secondary | ICD-10-CM | POA: Diagnosis not present

## 2018-06-27 DIAGNOSIS — I6523 Occlusion and stenosis of bilateral carotid arteries: Secondary | ICD-10-CM | POA: Diagnosis not present

## 2018-06-27 DIAGNOSIS — E785 Hyperlipidemia, unspecified: Secondary | ICD-10-CM | POA: Diagnosis present

## 2018-06-27 DIAGNOSIS — I255 Ischemic cardiomyopathy: Secondary | ICD-10-CM | POA: Diagnosis not present

## 2018-06-27 DIAGNOSIS — J439 Emphysema, unspecified: Secondary | ICD-10-CM | POA: Diagnosis not present

## 2018-06-27 DIAGNOSIS — I6522 Occlusion and stenosis of left carotid artery: Secondary | ICD-10-CM | POA: Diagnosis present

## 2018-06-27 DIAGNOSIS — Z95820 Peripheral vascular angioplasty status with implants and grafts: Secondary | ICD-10-CM

## 2018-06-27 DIAGNOSIS — Z8249 Family history of ischemic heart disease and other diseases of the circulatory system: Secondary | ICD-10-CM

## 2018-06-27 DIAGNOSIS — F17210 Nicotine dependence, cigarettes, uncomplicated: Secondary | ICD-10-CM | POA: Diagnosis present

## 2018-06-27 DIAGNOSIS — E039 Hypothyroidism, unspecified: Secondary | ICD-10-CM | POA: Diagnosis present

## 2018-06-27 DIAGNOSIS — Z7902 Long term (current) use of antithrombotics/antiplatelets: Secondary | ICD-10-CM | POA: Diagnosis not present

## 2018-06-27 DIAGNOSIS — I088 Other rheumatic multiple valve diseases: Secondary | ICD-10-CM | POA: Diagnosis present

## 2018-06-27 DIAGNOSIS — Z720 Tobacco use: Secondary | ICD-10-CM | POA: Diagnosis present

## 2018-06-27 DIAGNOSIS — I739 Peripheral vascular disease, unspecified: Secondary | ICD-10-CM | POA: Diagnosis present

## 2018-06-27 DIAGNOSIS — I10 Essential (primary) hypertension: Secondary | ICD-10-CM | POA: Diagnosis present

## 2018-06-27 DIAGNOSIS — I110 Hypertensive heart disease with heart failure: Principal | ICD-10-CM | POA: Diagnosis present

## 2018-06-27 DIAGNOSIS — I6529 Occlusion and stenosis of unspecified carotid artery: Secondary | ICD-10-CM | POA: Diagnosis present

## 2018-06-27 DIAGNOSIS — Z885 Allergy status to narcotic agent status: Secondary | ICD-10-CM | POA: Diagnosis not present

## 2018-06-27 DIAGNOSIS — I252 Old myocardial infarction: Secondary | ICD-10-CM | POA: Diagnosis not present

## 2018-06-27 DIAGNOSIS — R0602 Shortness of breath: Secondary | ICD-10-CM | POA: Diagnosis not present

## 2018-06-27 DIAGNOSIS — Z806 Family history of leukemia: Secondary | ICD-10-CM

## 2018-06-27 DIAGNOSIS — N281 Cyst of kidney, acquired: Secondary | ICD-10-CM | POA: Diagnosis not present

## 2018-06-27 DIAGNOSIS — Z8261 Family history of arthritis: Secondary | ICD-10-CM

## 2018-06-27 DIAGNOSIS — I447 Left bundle-branch block, unspecified: Secondary | ICD-10-CM | POA: Diagnosis present

## 2018-06-27 DIAGNOSIS — Z800 Family history of malignant neoplasm of digestive organs: Secondary | ICD-10-CM

## 2018-06-27 DIAGNOSIS — Z79899 Other long term (current) drug therapy: Secondary | ICD-10-CM | POA: Diagnosis not present

## 2018-06-27 DIAGNOSIS — Z7982 Long term (current) use of aspirin: Secondary | ICD-10-CM

## 2018-06-27 DIAGNOSIS — Z955 Presence of coronary angioplasty implant and graft: Secondary | ICD-10-CM

## 2018-06-27 DIAGNOSIS — Z833 Family history of diabetes mellitus: Secondary | ICD-10-CM

## 2018-06-27 DIAGNOSIS — I509 Heart failure, unspecified: Secondary | ICD-10-CM | POA: Diagnosis not present

## 2018-06-27 DIAGNOSIS — E7800 Pure hypercholesterolemia, unspecified: Secondary | ICD-10-CM | POA: Diagnosis present

## 2018-06-27 DIAGNOSIS — I700 Atherosclerosis of aorta: Secondary | ICD-10-CM | POA: Diagnosis not present

## 2018-06-27 DIAGNOSIS — I5023 Acute on chronic systolic (congestive) heart failure: Secondary | ICD-10-CM | POA: Diagnosis present

## 2018-06-27 DIAGNOSIS — Z823 Family history of stroke: Secondary | ICD-10-CM

## 2018-06-27 LAB — CBC
HCT: 48.4 % — ABNORMAL HIGH (ref 36.0–46.0)
HEMOGLOBIN: 15 g/dL (ref 12.0–15.0)
MCH: 26.6 pg (ref 26.0–34.0)
MCHC: 31 g/dL (ref 30.0–36.0)
MCV: 85.8 fL (ref 80.0–100.0)
Platelets: 192 10*3/uL (ref 150–400)
RBC: 5.64 MIL/uL — ABNORMAL HIGH (ref 3.87–5.11)
RDW: 14.6 % (ref 11.5–15.5)
WBC: 8 10*3/uL (ref 4.0–10.5)
nRBC: 0 % (ref 0.0–0.2)

## 2018-06-27 LAB — BASIC METABOLIC PANEL
Anion gap: 8 (ref 5–15)
BUN: 23 mg/dL (ref 8–23)
CO2: 21 mmol/L — ABNORMAL LOW (ref 22–32)
Calcium: 8.6 mg/dL — ABNORMAL LOW (ref 8.9–10.3)
Chloride: 112 mmol/L — ABNORMAL HIGH (ref 98–111)
Creatinine, Ser: 0.81 mg/dL (ref 0.44–1.00)
GFR calc Af Amer: >60 mL/min (ref >60)
GFR calc non Af Amer: >60 mL/min (ref >60)
Glucose, Bld: 131 mg/dL — ABNORMAL HIGH (ref 70–99)
POTASSIUM: 3.7 mmol/L (ref 3.5–5.1)
Sodium: 141 mmol/L (ref 135–145)

## 2018-06-27 LAB — I-STAT TROPONIN, ED: TROPONIN I, POC: 0.08 ng/mL (ref 0.00–0.08)

## 2018-06-27 LAB — BRAIN NATRIURETIC PEPTIDE: B Natriuretic Peptide: 954.2 pg/mL — ABNORMAL HIGH (ref 0.0–100.0)

## 2018-06-27 LAB — D-DIMER, QUANTITATIVE: D-Dimer, Quant: 1.52 ug/mL-FEU — ABNORMAL HIGH (ref 0.00–0.50)

## 2018-06-27 MED ORDER — METOPROLOL SUCCINATE ER 50 MG PO TB24
50.0000 mg | ORAL_TABLET | Freq: Every day | ORAL | Status: DC
  Administered 2018-06-28: 50 mg via ORAL
  Filled 2018-06-27: qty 1

## 2018-06-27 MED ORDER — CLOPIDOGREL BISULFATE 75 MG PO TABS
75.0000 mg | ORAL_TABLET | Freq: Every day | ORAL | Status: DC
  Administered 2018-06-28 – 2018-06-30 (×3): 75 mg via ORAL
  Filled 2018-06-27 (×3): qty 1

## 2018-06-27 MED ORDER — HEPARIN SODIUM (PORCINE) 5000 UNIT/ML IJ SOLN
5000.0000 [IU] | Freq: Three times a day (TID) | INTRAMUSCULAR | Status: DC
  Administered 2018-06-27: 5000 [IU] via SUBCUTANEOUS
  Filled 2018-06-27: qty 1

## 2018-06-27 MED ORDER — ROSUVASTATIN CALCIUM 5 MG PO TABS
10.0000 mg | ORAL_TABLET | Freq: Every day | ORAL | Status: DC
  Administered 2018-06-28: 10 mg via ORAL
  Filled 2018-06-27: qty 2

## 2018-06-27 MED ORDER — ONDANSETRON HCL 4 MG/2ML IJ SOLN: 4.0000 mg | Freq: Four times a day (QID) | INTRAMUSCULAR | Status: DC | PRN

## 2018-06-27 MED ORDER — FUROSEMIDE 10 MG/ML IJ SOLN
20.0000 mg | Freq: Once | INTRAMUSCULAR | Status: AC
  Administered 2018-06-28: 20 mg via INTRAVENOUS
  Filled 2018-06-27: qty 2

## 2018-06-27 MED ORDER — FUROSEMIDE 10 MG/ML IJ SOLN
20.0000 mg | Freq: Once | INTRAMUSCULAR | Status: AC
  Administered 2018-06-27: 20 mg via INTRAVENOUS
  Filled 2018-06-27: qty 2

## 2018-06-27 MED ORDER — SODIUM CHLORIDE 0.9% FLUSH
3.0000 mL | Freq: Two times a day (BID) | INTRAVENOUS | Status: DC
  Administered 2018-06-27 – 2018-06-30 (×3): 3 mL via INTRAVENOUS

## 2018-06-27 MED ORDER — SODIUM CHLORIDE 0.9 % IV SOLN: 250.0000 mL | INTRAVENOUS | Status: DC | PRN

## 2018-06-27 MED ORDER — ASPIRIN EC 81 MG PO TBEC
81.0000 mg | DELAYED_RELEASE_TABLET | Freq: Every day | ORAL | Status: DC
  Administered 2018-06-28 – 2018-06-30 (×3): 81 mg via ORAL
  Filled 2018-06-27 (×3): qty 1

## 2018-06-27 MED ORDER — LEVOTHYROXINE SODIUM 25 MCG PO TABS
25.0000 ug | ORAL_TABLET | Freq: Every day | ORAL | Status: DC
  Administered 2018-06-28 – 2018-06-30 (×3): 25 ug via ORAL
  Filled 2018-06-27 (×3): qty 1

## 2018-06-27 MED ORDER — RAMIPRIL 2.5 MG PO CAPS
5.0000 mg | ORAL_CAPSULE | Freq: Every day | ORAL | Status: DC
  Administered 2018-06-28 – 2018-06-30 (×3): 5 mg via ORAL
  Filled 2018-06-27 (×3): qty 2

## 2018-06-27 MED ORDER — ACETAMINOPHEN 325 MG PO TABS
650.0000 mg | ORAL_TABLET | ORAL | Status: DC | PRN
  Filled 2018-06-27: qty 2

## 2018-06-27 MED ORDER — SODIUM CHLORIDE 0.9% FLUSH: 3.0000 mL | INTRAVENOUS | Status: DC | PRN

## 2018-06-27 MED ORDER — IOPAMIDOL (ISOVUE-370) INJECTION 76%
100.0000 mL | Freq: Once | INTRAVENOUS | Status: AC | PRN
  Administered 2018-06-27: 55 mL via INTRAVENOUS

## 2018-06-27 MED ORDER — PNEUMOCOCCAL VAC POLYVALENT 25 MCG/0.5ML IJ INJ
0.5000 mL | INJECTION | INTRAMUSCULAR | Status: AC
  Administered 2018-06-28: 0.5 mL via INTRAMUSCULAR
  Filled 2018-06-27: qty 0.5

## 2018-06-27 NOTE — Progress Notes (Signed)
Patient arrived to unit, no complaints. Educated patient on heart failure. Patient oriented to unit and updated on plan of care. Will continue to monitor.

## 2018-06-27 NOTE — ED Triage Notes (Signed)
The pt is c/o pain in her upper thighs and her feet and ankles started swelling last  Night. Sob also for 3 weeks

## 2018-06-27 NOTE — ED Provider Notes (Signed)
[facility] EMERGENCY DEPARTMENT Provider Note   CSN: [id number] Arrival date & time: 06/27/18  1605     History   Chief Complaint Chief Complaint  Patient presents with  . Leg Swelling    HPI Andrea Hubbard is a 73 y.o. female.  73 yo F with a chief complaint of shortness of breath on exertion.  This is worsened over the past 2 or 3 weeks.  She went and saw her cardiologist who is scheduled her for an outpatient stress test.  The patient in the meantime is gotten slightly worse.  She woke up this morning with bilateral lower extremity edema worse on the left than the right.  She has never had this before.  Denies history of heart failure previously.  States that she will walk up a flight of stairs and now has to stop about halfway up and take a break.  She denies chest pain or pressure with this.  She denies history of PE or DVT.  Denies history of cancer.  Denies hemoptysis denies prolonged travel or immobilization.  Denies recent hospitalization.  Denies estrogen use.  The history is provided by the patient.  Illness  This is a new problem. The current episode started more than 1 week ago. The problem occurs constantly. The problem has been gradually worsening. Associated symptoms include shortness of breath. Pertinent negatives include no chest pain and no headaches. Nothing aggravates the symptoms. Nothing relieves the symptoms. She has tried nothing for the symptoms. The treatment provided no relief.    Past Medical History:  Diagnosis Date  . Automatic implantable cardioverter-defibrillator in situ 2011   [employer]   . Carotid artery disease ([place])   . Coronary artery disease   . Dysrhythmia   . History of nuclear stress test 04/26/2012   mod-severe perfusion defect in basal inferior septal and basal inf, mid inferoseptal, mid inferio & apical inferior - consistent with infarct/scar; low risk   . Hypercholesteremia   . Hypertension   . ICD  (implantable cardioverter-defibrillator) in place 03/08/2016  . PVD (peripheral vascular disease) ([place])   . Silent myocardial infarction ([place]) 2009; 2010  . Tobacco abuse     Patient Active Problem List   Diagnosis Date Noted  . Acute on chronic systolic (congestive) heart failure ([place]) 06/27/2018  . Acquired hypothyroidism 06/22/2018  . Chronic systolic heart failure ([place]) 03/08/2016  . ICD (implantable cardioverter-defibrillator) in place 03/08/2016  . Carotid stenosis 03/08/2016  . Claudication ([place]) 12/30/2013  . Hyperlipidemia 11/26/2013  . Essential hypertension 11/26/2013  . CAD (coronary artery disease) 02/08/2013  . Tobacco abuse 02/08/2013  . Cardiomyopathy, ischemic, ejection fraction 30-35%.  [employer] Teligen AICD 02/08/2013  . PAD (peripheral artery disease),  02/08/2013  . Vertigo 02/08/2013    Past Surgical History:  Procedure Laterality Date  . ANGIOPLASTY / STENTING FEMORAL Right 2010  . CARDIAC DEFIBRILLATOR PLACEMENT  04/2010  . Carotid Duplex  04/2012   R ECA - severe vessel narrowing with inc velocities 70-99% diameter reduction; L subclav - elevated velocities 50-69% diameter reduction; L vertebral - occlusive disease; L bulb/prox ICA - elevated velocities in prox & mid segments on ICA, 50-69% diameter reduction   . CESAREAN SECTION  1979  . COLONOSCOPY  06/11/2012   Procedure: COLONOSCOPY;  Surgeon: Juanita Craver, MD;  Location: [facility] ENDOSCOPY;  Service: Endoscopy;  Laterality: N/A;  . ENDARTERECTOMY Right 2010  . ILIAC ARTERY STENT Bilateral 12/30/2013  . LOWER EXTREMITY ANGIOGRAM Bilateral 12/30/2013  Procedure: LOWER EXTREMITY ANGIOGRAM;  Surgeon: Lorretta Harp, MD;  Location: [facility] CATH LAB;  Service: Cardiovascular;  Laterality: Bilateral;  . Lower Extremity Arterial Doppler  04/2012   ABI 0.63 on R and 0.77 on L  . PERCUTANEOUS CORONARY STENT INTERVENTION (PCI-S)     in [place]  . TRANSTHORACIC ECHOCARDIOGRAM  04/26/2012   EF 30-35%; imparied LV  relaxation; mod post wall hypokinesis, mod-severe ant wall kypokinesis and severe septal hypokinesis; RV systolic function mod redcued, with RV systolic pressure elevated; mild AVR     OB History   No obstetric history on file.      Home Medications    Prior to Admission medications   Medication Sig Start Date End Date Taking? Authorizing Provider  aspirin EC 81 MG tablet Take 81 mg by mouth daily.   Yes [provider]  clopidogrel (PLAVIX) 75 MG tablet TAKE 1 TABLET DAILY Patient taking differently: Take 75 mg by mouth daily.  04/30/18  Yes Lorretta Harp, MD  levothyroxine (SYNTHROID, LEVOTHROID) 25 MCG tablet Take 1 tablet (25 mcg total) by mouth daily before breakfast. 06/25/18  Yes Minette Brine, FNP  metoprolol succinate (TOPROL-XL) 50 MG 24 hr tablet Take 1 tablet (50 mg total) by mouth daily. Take with or immediately following a meal. 06/22/18  Yes Minette Brine, FNP  naproxen sodium (ALEVE) 220 MG tablet Take 220-440 mg by mouth 2 (two) times daily as needed (for pain or headaches).   Yes [provider]  ramipril (ALTACE) 5 MG capsule Take 1 capsule (5 mg total) by mouth daily. 06/22/18  Yes Minette Brine, FNP  rosuvastatin (CRESTOR) 10 MG tablet Take 1 tablet (10 mg total) by mouth daily. 06/22/18  Yes Minette Brine, FNP    Family History Family History  Problem Relation Age of Onset  . Leukemia Mother   . Pancreatic cancer Father   . Diabetes Maternal Grandmother   . Sudden death Maternal Grandfather   . Stroke Paternal Grandfather   . Rheum arthritis Sister   . Hypertension Son     Social History Social History   Tobacco Use  . Smoking status: Current Some Day Smoker    Packs/day: 0.12    Years: 46.00    Pack years: 5.52    Types: Cigarettes  . Smokeless tobacco: Never Used  . Tobacco comment: 12/30/2013 "down to less than 1 pack cigarettes/wk"  Substance Use Topics  . Alcohol use: No  . Drug use: No     Allergies   Morphine and  related   Review of Systems Review of Systems  Constitutional: Negative for chills and fever.  HENT: Negative for congestion and rhinorrhea.   Eyes: Negative for redness and visual disturbance.  Respiratory: Positive for shortness of breath. Negative for wheezing.   Cardiovascular: Positive for leg swelling. Negative for chest pain and palpitations.  Gastrointestinal: Negative for nausea and vomiting.  Genitourinary: Negative for dysuria and urgency.  Musculoskeletal: Negative for arthralgias and myalgias.  Skin: Negative for pallor and wound.  Neurological: Negative for dizziness and headaches.     Physical Exam Updated Vital Signs BP 127/64   Pulse 80   Temp 98.6 F (37 C) (Oral)   Resp (!) 21   Ht 5\' 6"  (1.676 m)   Wt 61.2 kg   SpO2 98%   BMI 21.79 kg/m   Physical Exam Vitals signs and nursing note reviewed.  Constitutional:      General: She is not in acute distress.  Appearance: She is well-developed. She is not diaphoretic.  HENT:     Head: Normocephalic and atraumatic.  Eyes:     Pupils: Pupils are equal, round, and reactive to light.  Neck:     Musculoskeletal: Normal range of motion and neck supple.  Cardiovascular:     Rate and Rhythm: Normal rate and regular rhythm.     Heart sounds: No murmur. No friction rub. No gallop.   Pulmonary:     Effort: Pulmonary effort is normal.     Breath sounds: No wheezing or rales.  Abdominal:     General: There is no distension.     Palpations: Abdomen is soft.     Tenderness: There is no abdominal tenderness.  Musculoskeletal:        General: No tenderness.     Right lower leg: Edema present.     Left lower leg: Edema present.     Comments: Left slightly greater than right lower extremity edema up to the lower thigh  Skin:    General: Skin is warm and dry.  Neurological:     Mental Status: She is alert and oriented to person, place, and time.  Psychiatric:        Behavior: Behavior normal.      ED  Treatments / Results  Labs (all labs ordered are listed, but only abnormal results are displayed) Labs Reviewed  BASIC METABOLIC PANEL - Abnormal; Notable for the following components:      Result Value   Chloride 112 (*)    CO2 21 (*)    Glucose, Bld 131 (*)    Calcium 8.6 (*)    All other components within normal limits  CBC - Abnormal; Notable for the following components:   RBC 5.64 (*)    HCT 48.4 (*)    All other components within normal limits  BRAIN NATRIURETIC PEPTIDE - Abnormal; Notable for the following components:   B Natriuretic Peptide 954.2 (*)    All other components within normal limits  D-DIMER, QUANTITATIVE (NOT AT [facility]) - Abnormal; Notable for the following components:   D-Dimer, Quant 1.52 (*)    All other components within normal limits  BASIC METABOLIC PANEL  TSH  TROPONIN I  TROPONIN I  TROPONIN I  I-STAT TROPONIN, ED    EKG EKG Interpretation  Date/Time:  Wednesday June 27 2018 16:31:17 EST Ventricular Rate:  75 PR Interval:  184 QRS Duration: 104 QT Interval:  402 QTC Calculation: 448 R Axis:   106 Text Interpretation:  Normal sinus rhythm Possible Left atrial enlargement Rightward axis Anterior infarct , age undetermined T wave abnormality, consider inferolateral ischemia Abnormal ECG No significant change since last tracing Confirmed by Deno Etienne [phone]) on 06/27/2018 5:02:28 PM   Radiology Dg Chest 2 View  Result Date: 06/27/2018 CLINICAL DATA:  Swollen extremities, short of breath EXAM: CHEST - 2 VIEW COMPARISON:  CT 06/27/2018, radiograph 12/23/2013 FINDINGS: Left-sided pacing device as before. Interval moderate cardiomegaly without edema. No pleural effusion or focal airspace disease. Aortic atherosclerosis. IMPRESSION: Interval moderate cardiomegaly without edema or infiltrate Electronically Signed   By: Donavan Foil M.D.   On: 06/27/2018 19:52   Ct Angio Chest Pe W And/or Wo Contrast  Result Date: 06/27/2018 CLINICAL DATA:  Bilateral  leg swelling EXAM: CT ANGIOGRAPHY CHEST WITH CONTRAST TECHNIQUE: Multidetector CT imaging of the chest was performed using the standard protocol during bolus administration of intravenous contrast. Multiplanar CT image reconstructions and MIPs were obtained to evaluate the  vascular anatomy. CONTRAST:  65mL ISOVUE-370 IOPAMIDOL (ISOVUE-370) INJECTION 76% COMPARISON:  Chest x-ray 12/23/2013 FINDINGS: Cardiovascular: Satisfactory opacification of the pulmonary arteries to the segmental level. No evidence of pulmonary embolism. Nonaneurysmal aorta. Moderate aortic atherosclerosis. Coronary vascular calcification. Moderate cardiomegaly with multi chamber enlargement. No pericardial effusion. Reflux of contrast into the hepatic veins suggesting elevated right heart pressure. Mediastinum/Nodes: Midline trachea. No thyroid mass. No significantly enlarged lymph nodes. Esophagus within normal limits. Lungs/Pleura: No acute consolidation or effusion.  No pneumothorax. Upper Abdomen: No acute abnormality. Musculoskeletal: No acute or suspicious abnormality Review of the MIP images confirms the above findings. IMPRESSION: 1. Negative for acute pulmonary embolus 2. Moderate cardiomegaly with multi chamber enlargement. Reflux of contrast into the attic veins suggesting elevated right heart pressures. Aortic Atherosclerosis (ICD10-I70.0). Electronically Signed   By: Donavan Foil M.D.   On: 06/27/2018 19:47    Procedures Procedures (including critical care time)  Medications Ordered in ED Medications  aspirin EC tablet 81 mg (has no administration in time range)  metoprolol succinate (TOPROL-XL) 24 hr tablet 50 mg (has no administration in time range)  ramipril (ALTACE) capsule 5 mg (has no administration in time range)  rosuvastatin (CRESTOR) tablet 10 mg (has no administration in time range)  levothyroxine (SYNTHROID, LEVOTHROID) tablet 25 mcg (has no administration in time range)  clopidogrel (PLAVIX) tablet 75 mg  (has no administration in time range)  sodium chloride flush (NS) 0.9 % injection 3 mL (has no administration in time range)  sodium chloride flush (NS) 0.9 % injection 3 mL (has no administration in time range)  0.9 %  sodium chloride infusion (has no administration in time range)  acetaminophen (TYLENOL) tablet 650 mg (has no administration in time range)  ondansetron (ZOFRAN) injection 4 mg (has no administration in time range)  heparin injection 5,000 Units (has no administration in time range)  furosemide (LASIX) injection 20 mg (has no administration in time range)  iopamidol (ISOVUE-370) 76 % injection 100 mL (55 mLs Intravenous Contrast Given 06/27/18 1920)  furosemide (LASIX) injection 20 mg (20 mg Intravenous Given 06/27/18 2017)     Initial Impression / Assessment and Plan / ED Course  I have reviewed the triage vital signs and the nursing notes.  Pertinent labs & imaging results that were available during my care of the patient were reviewed by me and considered in my medical decision making (see chart for details).     73 yo F with a chief complaint of shortness of breath on exertion and bilateral lower extremity edema.  Shortness of breath is been going on for about a month.  Lower extremity edema started this morning.  She did admit to being on her feet for a prolonged time yesterday.  Her left lower extremity is slightly larger than the right.  Will obtain a d-dimer.  Lab work EKG.  Will discuss with cardiology. Cards to admit.  The patients results and plan were reviewed and discussed.   Any x-rays performed were independently reviewed by myself.   Differential diagnosis were considered with the presenting HPI.  Medications  aspirin EC tablet 81 mg (has no administration in time range)  metoprolol succinate (TOPROL-XL) 24 hr tablet 50 mg (has no administration in time range)  ramipril (ALTACE) capsule 5 mg (has no administration in time range)  rosuvastatin (CRESTOR) tablet  10 mg (has no administration in time range)  levothyroxine (SYNTHROID, LEVOTHROID) tablet 25 mcg (has no administration in time range)  clopidogrel (PLAVIX) tablet 75 mg (  has no administration in time range)  sodium chloride flush (NS) 0.9 % injection 3 mL (has no administration in time range)  sodium chloride flush (NS) 0.9 % injection 3 mL (has no administration in time range)  0.9 %  sodium chloride infusion (has no administration in time range)  acetaminophen (TYLENOL) tablet 650 mg (has no administration in time range)  ondansetron (ZOFRAN) injection 4 mg (has no administration in time range)  heparin injection 5,000 Units (has no administration in time range)  furosemide (LASIX) injection 20 mg (has no administration in time range)  iopamidol (ISOVUE-370) 76 % injection 100 mL (55 mLs Intravenous Contrast Given 06/27/18 1920)  furosemide (LASIX) injection 20 mg (20 mg Intravenous Given 06/27/18 2017)    Vitals:   06/27/18 1900 06/27/18 2030 06/27/18 2045 06/27/18 2158  BP: 140/84 (!) 127/95 (!) 126/111 127/64  Pulse: 79 76 80   Resp: (!) 21 16 (!) 21   Temp:      TempSrc:      SpO2: 96% 98% 98%   Weight:      Height:        Final diagnoses:  Acute on chronic systolic heart failure (HCC)    Admission/ observation were discussed with the admitting physician, patient and/or family and they are comfortable with the plan.    Final Clinical Impressions(s) / ED Diagnoses   Final diagnoses:  Acute on chronic systolic heart failure [facility])    ED Discharge Orders    None       Deno Etienne, DO 06/27/18 2231

## 2018-06-27 NOTE — Plan of Care (Signed)
  Problem: Activity: Goal: Capacity to carry out activities will improve Outcome: Progressing   Problem: Cardiac: Goal: Ability to achieve and maintain adequate cardiopulmonary perfusion will improve Outcome: Progressing   Problem: Education: Goal: Ability to demonstrate management of disease process will improve Outcome: Progressing   Problem: Education: Goal: Ability to verbalize understanding of medication therapies will improve Outcome: Progressing

## 2018-06-27 NOTE — H&P (Addendum)
History & Physical    Patient ID: Andrea Hubbard MRN: [id number], DOB/AGE: [birth year]   Admit date: 06/27/2018   Primary Physician: Glendale Chard, MD Primary Cardiologist: No primary care provider on file.  Patient Profile    Andrea Hubbard is a 73 year old woman with ICM (EF 10-15%) and s/p ICD placement, cerebrovascular disease with history of CEA, HTN, HL, PAD, prior tobacco use who presents due to ongoing shortness of breath and new lower extremity edema.   Past Medical History    Past Medical History:  Diagnosis Date  . Automatic implantable cardioverter-defibrillator in situ 2011   [employer]   . Carotid artery disease ([place])   . Coronary artery disease   . Dysrhythmia   . History of nuclear stress test 04/26/2012   mod-severe perfusion defect in basal inferior septal and basal inf, mid inferoseptal, mid inferio & apical inferior - consistent with infarct/scar; low risk   . Hypercholesteremia   . Hypertension   . ICD (implantable cardioverter-defibrillator) in place 03/08/2016  . PVD (peripheral vascular disease) ([place])   . Silent myocardial infarction ([place]) 2009; 2010  . Tobacco abuse     Past Surgical History:  Procedure Laterality Date  . ANGIOPLASTY / STENTING FEMORAL Right 2010  . CARDIAC DEFIBRILLATOR PLACEMENT  04/2010  . Carotid Duplex  04/2012   R ECA - severe vessel narrowing with inc velocities 70-99% diameter reduction; L subclav - elevated velocities 50-69% diameter reduction; L vertebral - occlusive disease; L bulb/prox ICA - elevated velocities in prox & mid segments on ICA, 50-69% diameter reduction   . CESAREAN SECTION  1979  . COLONOSCOPY  06/11/2012   Procedure: COLONOSCOPY;  Surgeon: Juanita Craver, MD;  Location: [facility] ENDOSCOPY;  Service: Endoscopy;  Laterality: N/A;  . ENDARTERECTOMY Right 2010  . ILIAC ARTERY STENT Bilateral 12/30/2013  . LOWER EXTREMITY ANGIOGRAM Bilateral 12/30/2013   Procedure: LOWER EXTREMITY ANGIOGRAM;  Surgeon: Lorretta Harp, MD;  Location: [facility] CATH LAB;  Service: Cardiovascular;  Laterality: Bilateral;  . Lower Extremity Arterial Doppler  04/2012   ABI 0.63 on R and 0.77 on L  . PERCUTANEOUS CORONARY STENT INTERVENTION (PCI-S)     in [place]  . TRANSTHORACIC ECHOCARDIOGRAM  04/26/2012   EF 30-35%; imparied LV relaxation; mod post wall hypokinesis, mod-severe ant wall kypokinesis and severe septal hypokinesis; RV systolic function mod redcued, with RV systolic pressure elevated; mild AVR     Allergies  Allergies  Allergen Reactions  . Morphine And Related Other (See Comments) and Hypertension    Raises blood pressure, makes the patient "hyper"    History of Present Illness    Andrea Hubbard is a 73 year old woman with ICM (EF 10-15%) and s/p ICD placement, cerebrovascular disease with history of CEA, HTN, HL, PAD, ongoing tobacco use who presents due to ongoing shortness of breath and new lower extremity edema.  The patient was notably recently seen by her outpatient cardiologist, Dr. Gwenlyn Found, on 12/26. At that time, she noted increasing shortness of breath. She underwent TTE which showed a decrease in EF from 30-35% to 10-15%, moderate-severe AI, mild MR, severe TR and moderate PR. She was scheduled for nuclear stress test for further evaluation. Since that time, the patient reports she has had ongoing shortness of breath which requires her to stop and rest during usual activities, such as climbing a flight of stairs. She noted some thigh tightness previously but today also has new lower extremity edema and thus she presented to  the ED. She denies chest pain, palpitations, syncope, PND.  In the ED, she was hemodynamically stable and afebrile. Laboratory results significant for BNP of 954, d-dimer of 1.52, troponin 0.08. CTA of the chest was negative for PE. She was treated with 20mg  IV lasix x1.   Home Medications    Prior to Admission medications   Medication Sig Start Date End Date Taking? Authorizing Provider   aspirin EC 81 MG tablet Take 81 mg by mouth daily.   Yes [provider]  clopidogrel (PLAVIX) 75 MG tablet TAKE 1 TABLET DAILY Patient taking differently: Take 75 mg by mouth daily.  04/30/18  Yes Lorretta Harp, MD  levothyroxine (SYNTHROID, LEVOTHROID) 25 MCG tablet Take 1 tablet (25 mcg total) by mouth daily before breakfast. 06/25/18  Yes Minette Brine, FNP  metoprolol succinate (TOPROL-XL) 50 MG 24 hr tablet Take 1 tablet (50 mg total) by mouth daily. Take with or immediately following a meal. 06/22/18  Yes Minette Brine, FNP  naproxen sodium (ALEVE) 220 MG tablet Take 220-440 mg by mouth 2 (two) times daily as needed (for pain or headaches).   Yes [provider]  ramipril (ALTACE) 5 MG capsule Take 1 capsule (5 mg total) by mouth daily. 06/22/18  Yes Minette Brine, FNP  rosuvastatin (CRESTOR) 10 MG tablet Take 1 tablet (10 mg total) by mouth daily. 06/22/18  Yes Minette Brine, FNP    Family History    Family History  Problem Relation Age of Onset  . Leukemia Mother   . Pancreatic cancer Father   . Diabetes Maternal Grandmother   . Sudden death Maternal Grandfather   . Stroke Paternal Grandfather   . Rheum arthritis Sister   . Hypertension Son    She indicated that her mother is deceased. She indicated that her father is deceased. She indicated that the status of her sister is unknown. She indicated that her maternal grandmother is deceased. She indicated that her maternal grandfather is deceased. She indicated that her paternal grandmother is deceased. She indicated that her paternal grandfather is deceased. She indicated that the status of her son is unknown.   Social History    Social History   Socioeconomic History  . Marital status: Married    Spouse name: Not on file  . Number of children: 3  . Years of education: coolege   . Highest education level: Not on file  Occupational History  . Occupation: retired Mudlogger for [employer]  Social  Needs  . Financial resource strain: Not on file  . Food insecurity:    Worry: Not on file    Inability: Not on file  . Transportation needs:    Medical: Not on file    Non-medical: Not on file  Tobacco Use  . Smoking status: Current Some Day Smoker    Packs/day: 0.12    Years: 46.00    Pack years: 5.52    Types: Cigarettes  . Smokeless tobacco: Never Used  . Tobacco comment: 12/30/2013 "down to less than 1 pack cigarettes/wk"  Substance and Sexual Activity  . Alcohol use: No  . Drug use: No  . Sexual activity: Not Currently  Lifestyle  . Physical activity:    Days per week: Not on file    Minutes per session: Not on file  . Stress: Not on file  Relationships  . Social connections:    Talks on phone: Not on file    Gets together: Not on file    Attends  religious service: Not on file    Active member of club or organization: Not on file    Attends meetings of clubs or organizations: Not on file    Relationship status: Not on file  . Intimate partner violence:    Fear of current or ex partner: Not on file    Emotionally abused: Not on file    Physically abused: Not on file    Forced sexual activity: Not on file  Other Topics Concern  . Not on file  Social History Narrative  . Not on file     Review of Systems    General:  No chills, fever, night sweats or weight changes.  Cardiovascular:  As per HPI Dermatological: No rash, lesions/masses Respiratory: No cough Urologic: No hematuria, dysuria Abdominal:   No nausea, vomiting, diarrhea, bright red blood per rectum, melena, or hematemesis Neurologic:  No visual changes, wkns, changes in mental status. All other systems reviewed and are otherwise negative except as noted above.  Physical Exam    Blood pressure 130/64, pulse 65, temperature 97.6 F (36.4 C), temperature source Oral, resp. rate 18, height 5\' 6"  (1.676 m), weight 61.6 kg, SpO2 99 %.  General: Pleasant, NAD Psych: Normal affect. Neuro: Alert and  oriented X 3. Moves all extremities spontaneously. HEENT: Normal  Neck: Supple without bruits or JVD. Lungs:  Resp regular and unlabored. Decreased bibasilar breath sounds. No rhonchi or wheezes. Heart: RRR no s3, s4. + II/VI systolic murmur Abdomen: Soft, non-tender, non-distended, BS + x 4.  Extremities: No clubbing, cyanosis. L>R LEE with 2+ pitting edema on the left and 1+ on the right. DP/PT/Radials 2+ and equal bilaterally.  Labs    Troponin [facility] Test) Recent Labs    06/27/18 1740  TROPIPOC 0.08   No results for input(s): CKTOTAL, CKMB, TROPONINI in the last 72 hours. Lab Results  Component Value Date   WBC 8.0 06/27/2018   HGB 15.0 06/27/2018   HCT 48.4 (H) 06/27/2018   MCV 85.8 06/27/2018   PLT 192 06/27/2018    Recent Labs  Lab 06/22/18 1000 06/27/18 1730  NA 141 141  K 4.4 3.7  CL 105 112*  CO2 21 21*  BUN 17 23  CREATININE 0.89 0.81  CALCIUM 9.0 8.6*  PROT 5.6*  --   BILITOT 0.7  --   ALKPHOS 77  --   ALT 31  --   AST 29  --   GLUCOSE 98 131*   Lab Results  Component Value Date   CHOL 182 06/21/2018   HDL 43 06/21/2018   LDLCALC 119 (H) 06/21/2018   TRIG 99 06/21/2018   Lab Results  Component Value Date   DDIMER 1.52 (H) 06/27/2018     Radiology Studies    Dg Chest 2 View  Result Date: 06/27/2018 CLINICAL DATA:  Swollen extremities, short of breath EXAM: CHEST - 2 VIEW COMPARISON:  CT 06/27/2018, radiograph 12/23/2013 FINDINGS: Left-sided pacing device as before. Interval moderate cardiomegaly without edema. No pleural effusion or focal airspace disease. Aortic atherosclerosis. IMPRESSION: Interval moderate cardiomegaly without edema or infiltrate Electronically Signed   By: Donavan Foil M.D.   On: 06/27/2018 19:52   Ct Angio Chest Pe W And/or Wo Contrast  Result Date: 06/27/2018 CLINICAL DATA:  Bilateral leg swelling EXAM: CT ANGIOGRAPHY CHEST WITH CONTRAST TECHNIQUE: Multidetector CT imaging of the chest was performed using the  standard protocol during bolus administration of intravenous contrast. Multiplanar CT image reconstructions and MIPs were obtained  to evaluate the vascular anatomy. CONTRAST:  9mL ISOVUE-370 IOPAMIDOL (ISOVUE-370) INJECTION 76% COMPARISON:  Chest x-ray 12/23/2013 FINDINGS: Cardiovascular: Satisfactory opacification of the pulmonary arteries to the segmental level. No evidence of pulmonary embolism. Nonaneurysmal aorta. Moderate aortic atherosclerosis. Coronary vascular calcification. Moderate cardiomegaly with multi chamber enlargement. No pericardial effusion. Reflux of contrast into the hepatic veins suggesting elevated right heart pressure. Mediastinum/Nodes: Midline trachea. No thyroid mass. No significantly enlarged lymph nodes. Esophagus within normal limits. Lungs/Pleura: No acute consolidation or effusion.  No pneumothorax. Upper Abdomen: No acute abnormality. Musculoskeletal: No acute or suspicious abnormality Review of the MIP images confirms the above findings. IMPRESSION: 1. Negative for acute pulmonary embolus 2. Moderate cardiomegaly with multi chamber enlargement. Reflux of contrast into the attic veins suggesting elevated right heart pressures. Aortic Atherosclerosis (ICD10-I70.0). Electronically Signed   By: Donavan Foil M.D.   On: 06/27/2018 19:47    ECG & Cardiac Imaging    ECG 06/27/2018 - personally reviewed. Intraventricular conduction delay. TWI unchanged from prior. No acute ischemic changes.   TTE 06/22/2018 Study Conclusions  - Left ventricle: The cavity size was mildly dilated. Systolic   function was severely reduced. The estimated ejection fraction   was in the range of 10% to 15%. Diffuse hypokinesis. Doppler   parameters are consistent with abnormal left ventricular   relaxation (grade 1 diastolic dysfunction). Doppler parameters   are consistent with high ventricular filling pressure. - Aortic valve: Transvalvular velocity was within the normal range.   There was no  stenosis. There was moderate to severe   regurgitation. - Mitral valve: Transvalvular velocity was within the normal range.   There was no evidence for stenosis. There was mild regurgitation. - Left atrium: The atrium was moderately dilated. - Right ventricle: The cavity size was normal. Wall thickness was   normal. Systolic function was moderately reduced. - Right atrium: The atrium was severely dilated. - Tricuspid valve: There was severe regurgitation. - Pulmonic valve: There was moderate regurgitation. - Pulmonary arteries: Systolic pressure was severely increased. PA   peak pressure: 61 mm Hg (S). - Pericardium, extracardiac: A trivial pericardial effusion was   identified.  Assessment & Plan    Andrea Hubbard is a 73 year old woman with ICM (EF 10-15%) and s/p ICD placement, cerebrovascular disease with history of CEA, HTN, HL, PAD, prior tobacco use who presents due to ongoing shortness of breath and new lower extremity edema.   # ICM Patient with significant increase in symptoms over the last three weeks with progressive shortness of breath and now lower extremity edema. Recent TTE with decline in EF but also with significant valvular disease which is likely contributing to symptoms. No signs or symptoms suggestive of acute ischemia at this time but will need to be evaluated. No dietary indiscretions per patient as etiology of worsening. - Continue home metoprolol and ramipril - Will get planned stress test as inpatient - Received lasix 20mg  IV in ED, will give second dose then reevaluate volume status. For ongoing IV therapy versus transition to PO. - Consider transition from ramipril to entresto - Consider addition of spironolactone - Initial troponin 0.08, at upper limit of normal range; will continue to trend.  # CAD # PAD # Cerebrovascular disease - Cont ASA, plavix - Stress test as above - Cont crestor  # HTN - Cont metoprolol, rampril  # Ongoing tobacco use - Tobacco  cessation counseling  # Hypothyroidism - Cont home synthroid - TSH pending  Signed, Bryna Colander, MD  06/27/2018, 11:27 PM  Addendum 12:15 AM Troponin now elevated at 0.25; suspect type II event in the setting of decompensated heart failure given progressive HF symptoms and lack of any ischemic symptoms. However, patient with significant CAD history. Treatment of heart failure as above. Continue home ASA, plavix. Will start heparin gtt, discontinue stress test. Likely will need L +/- RHC for further evaluation. Will make patient NPO now.

## 2018-06-28 ENCOUNTER — Inpatient Hospital Stay (HOSPITAL_BASED_OUTPATIENT_CLINIC_OR_DEPARTMENT_OTHER): Payer: Medicare Other

## 2018-06-28 DIAGNOSIS — I6523 Occlusion and stenosis of bilateral carotid arteries: Secondary | ICD-10-CM

## 2018-06-28 DIAGNOSIS — I255 Ischemic cardiomyopathy: Secondary | ICD-10-CM

## 2018-06-28 DIAGNOSIS — I5023 Acute on chronic systolic (congestive) heart failure: Secondary | ICD-10-CM

## 2018-06-28 LAB — CBC
HCT: 45.7 % (ref 36.0–46.0)
Hemoglobin: 14.8 g/dL (ref 12.0–15.0)
MCH: 27.5 pg (ref 26.0–34.0)
MCHC: 32.4 g/dL (ref 30.0–36.0)
MCV: 84.9 fL (ref 80.0–100.0)
PLATELETS: 196 10*3/uL (ref 150–400)
RBC: 5.38 MIL/uL — ABNORMAL HIGH (ref 3.87–5.11)
RDW: 14.7 % (ref 11.5–15.5)
WBC: 8.3 10*3/uL (ref 4.0–10.5)
nRBC: 0 % (ref 0.0–0.2)

## 2018-06-28 LAB — BASIC METABOLIC PANEL
Anion gap: 9 (ref 5–15)
BUN: 16 mg/dL (ref 8–23)
CO2: 22 mmol/L (ref 22–32)
Calcium: 8.6 mg/dL — ABNORMAL LOW (ref 8.9–10.3)
Chloride: 111 mmol/L (ref 98–111)
Creatinine, Ser: 1.01 mg/dL — ABNORMAL HIGH (ref 0.44–1.00)
GFR calc Af Amer: >60 mL/min (ref >60)
GFR calc non Af Amer: 56 mL/min — ABNORMAL LOW (ref >60)
Glucose, Bld: 92 mg/dL (ref 70–99)
Potassium: 3.9 mmol/L (ref 3.5–5.1)
Sodium: 142 mmol/L (ref 135–145)

## 2018-06-28 LAB — TSH: TSH: 10.249 u[IU]/mL — ABNORMAL HIGH (ref 0.350–4.500)

## 2018-06-28 LAB — TROPONIN I
Troponin I: 0.25 ng/mL (ref <0.03)
Troponin I: 0.25 ng/mL (ref <0.03)
Troponin I: 0.25 ng/mL (ref <0.03)

## 2018-06-28 LAB — HEPARIN LEVEL (UNFRACTIONATED)
HEPARIN UNFRACTIONATED: 0.45 [IU]/mL (ref 0.30–0.70)
HEPARIN UNFRACTIONATED: 0.52 [IU]/mL (ref 0.30–0.70)

## 2018-06-28 LAB — T4, FREE: Free T4: 0.95 ng/dL (ref 0.82–1.77)

## 2018-06-28 LAB — MAGNESIUM: Magnesium: 1.6 mg/dL — ABNORMAL LOW (ref 1.7–2.4)

## 2018-06-28 MED ORDER — SPIRONOLACTONE 12.5 MG HALF TABLET
12.5000 mg | ORAL_TABLET | Freq: Every day | ORAL | Status: DC
  Administered 2018-06-28: 12.5 mg via ORAL
  Filled 2018-06-28: qty 1

## 2018-06-28 MED ORDER — FUROSEMIDE 10 MG/ML IJ SOLN
80.0000 mg | Freq: Two times a day (BID) | INTRAMUSCULAR | Status: DC
  Administered 2018-06-28: 80 mg via INTRAVENOUS
  Filled 2018-06-28: qty 8

## 2018-06-28 MED ORDER — ROSUVASTATIN CALCIUM 20 MG PO TABS
40.0000 mg | ORAL_TABLET | Freq: Every day | ORAL | Status: DC
  Administered 2018-06-29 – 2018-06-30 (×2): 40 mg via ORAL
  Filled 2018-06-28 (×2): qty 2

## 2018-06-28 MED ORDER — DIGOXIN 125 MCG PO TABS
0.1250 mg | ORAL_TABLET | Freq: Every day | ORAL | Status: DC
  Administered 2018-06-28 – 2018-06-30 (×3): 0.125 mg via ORAL
  Filled 2018-06-28 (×3): qty 1

## 2018-06-28 MED ORDER — CARVEDILOL 3.125 MG PO TABS
3.1250 mg | ORAL_TABLET | Freq: Two times a day (BID) | ORAL | Status: DC
  Administered 2018-06-28 – 2018-06-29 (×2): 3.125 mg via ORAL
  Filled 2018-06-28 (×2): qty 1

## 2018-06-28 MED ORDER — FUROSEMIDE 10 MG/ML IJ SOLN: 40.0000 mg | Freq: Two times a day (BID) | INTRAMUSCULAR | Status: DC

## 2018-06-28 MED ORDER — HEPARIN (PORCINE) 25000 UT/250ML-% IV SOLN
750.0000 [IU]/h | INTRAVENOUS | Status: DC
  Administered 2018-06-28 (×2): 750 [IU]/h via INTRAVENOUS
  Filled 2018-06-28 (×2): qty 250

## 2018-06-28 NOTE — Progress Notes (Signed)
I responded to a [place] to provide Advance Directive information for the patient. I visited the patient's room and listened with compassion as she spoke about her health status. I provided spiritual support by sharing words of encouragement and by leading in prayer. I shared that the Chaplain is available for additional support as needed or requested.    06/28/18 1000  Clinical Encounter Type  Visited With Patient  Visit Type Spiritual support  Referral From Nurse  Consult/Referral To Chaplain  Spiritual Encounters  Spiritual Needs Prayer;Literature  Stress Factors  Patient Stress Factors None identified    Chaplain Dr Redgie Grayer

## 2018-06-28 NOTE — Progress Notes (Signed)
ANTICOAGULATION CONSULT NOTE - follow up Pharmacy Consult for heparin Indication: chest pain/ACS  Allergies  Allergen Reactions  . Morphine And Related Other (See Comments) and Hypertension    Raises blood pressure, makes the patient "hyper"    Patient Measurements: Height: 5\' 6"  (167.6 cm) Weight: 134 lb 8 oz (61 kg) IBW/kg (Calculated) : 59.3  Vital Signs: Temp: 98.4 F (36.9 C) (01/02 0520) Temp Source: Oral (01/02 0520) BP: 124/66 (01/02 0520) Pulse Rate: 71 (01/02 0520)  Labs: Recent Labs    06/27/18 1730 06/27/18 2305 06/28/18 0414 06/28/18 0757  HGB 15.0  --  14.8  --   HCT 48.4*  --  45.7  --   PLT 192  --  196  --   HEPARINUNFRC  --   --   --  0.45  CREATININE 0.81  --  1.01*  --   TROPONINI  --  0.25* 0.25*  --     Estimated Creatinine Clearance: 47.8 mL/min (A) (by C-G formula based on SCr of 1.01 mg/dL (H)).   Medical History: Past Medical History:  Diagnosis Date  . Automatic implantable cardioverter-defibrillator in situ 2011   [employer]   . Carotid artery disease ([place])   . Coronary artery disease   . Dysrhythmia   . History of nuclear stress test 04/26/2012   mod-severe perfusion defect in basal inferior septal and basal inf, mid inferoseptal, mid inferio & apical inferior - consistent with infarct/scar; low risk   . Hypercholesteremia   . Hypertension   . ICD (implantable cardioverter-defibrillator) in place 03/08/2016  . PVD (peripheral vascular disease) ([place])   . Silent myocardial infarction ([place]) 2009; 2010  . Tobacco abuse     Medications:  Medications Prior to Admission  Medication Sig Dispense Refill Last Dose  . aspirin EC 81 MG tablet Take 81 mg by mouth daily.   06/27/2018 at 0900  . clopidogrel (PLAVIX) 75 MG tablet TAKE 1 TABLET DAILY (Patient taking differently: Take 75 mg by mouth daily. ) 90 tablet 0 06/27/2018 at 0900  . levothyroxine (SYNTHROID, LEVOTHROID) 25 MCG tablet Take 1 tablet (25 mcg total) by mouth  daily before breakfast. 30 tablet 2 06/27/2018 at am  . metoprolol succinate (TOPROL-XL) 50 MG 24 hr tablet Take 1 tablet (50 mg total) by mouth daily. Take with or immediately following a meal. 90 tablet 1 06/27/2018 at 0900  . naproxen sodium (ALEVE) 220 MG tablet Take 220-440 mg by mouth 2 (two) times daily as needed (for pain or headaches).   unk at unk  . ramipril (ALTACE) 5 MG capsule Take 1 capsule (5 mg total) by mouth daily. 90 capsule 1 06/27/2018 at am  . rosuvastatin (CRESTOR) 10 MG tablet Take 1 tablet (10 mg total) by mouth daily. 90 tablet 1 06/27/2018 at Unknown time   Scheduled:  . aspirin EC  81 mg Oral Daily  . clopidogrel  75 mg Oral Daily  . furosemide  20 mg Intravenous Once  . levothyroxine  25 mcg Oral QAC breakfast  . metoprolol succinate  50 mg Oral QPC breakfast  . pneumococcal 23 valent vaccine  0.5 mL Intramuscular Tomorrow-1000  . ramipril  5 mg Oral Daily  . rosuvastatin  10 mg Oral Daily  . sodium chloride flush  3 mL Intravenous Q12H   Infusions:  . sodium chloride    . heparin 750 Units/hr (06/28/18 0249)    Assessment: 73yo female c/o LE pain and swelling, admitted for ICM, initial troponin negative  but was increasing, thus started on IV heparin.  Heparin level is 0.45 on heparin drip at 750 units/hr.  Therapeutic level.   Goal of Therapy:  Heparin level 0.3-0.7 units/ml Monitor platelets by anticoagulation protocol: Yes   Plan:  Continue Heparin drip at 750 units/hr  6 hour heparin level to confirm remains therapeutic then monitor daily heparin levels and CBC. F/u post cath if planned cath today.  Nicole Cella, RPh Clinical Pharmacist [phone] Please check AMION for all [place] phone numbers After 10:00 PM, call [place] [phone] 06/28/2018,9:37 AM

## 2018-06-28 NOTE — Consult Note (Addendum)
Advanced Heart Failure Team Consult Note   Primary Physician: Glendale Chard, MD PCP-Cardiologist:  Dr Gwenlyn Found  Reason for Consultation: A/C systolic HF  HPI:    Meranda Dechaine is seen today for evaluation of A/C systolic HF at the request of Dr Georgette Shell.   Trella Thurmond is a 73 y.o. female with a history of tobacco use, HTN, hyperlipidemia, silent MI 2010 s/p PCI, carotid stenosis s/p right CEA 2010, PAD s/p stent to right common/external iliac artery and left common iliac artery in 2952, chronic systolic HF due to ICM (EF 30-35%), s/p [employer] ICD.  She saw Dr Gwenlyn Found 06/21/2018. He had not seen her in 2 years. She was much more SOB and was complaining of palpitations. A repeat echo was ordered and she was referred to Advanced HF clinic.   Repeat echo showed EF 10-15% (down from 30-35% 7 years ago).  She tells me she has felt poorly over the last month. She has SOB with minimal exertion, fatigue, and palpitations. She also has severe anterior thigh pain at rest and with activity x 1 month. No CP or orthopnea. +PND. Appetite has been okay. No weight gain. She is lightheaded with quick movements. She also says that she has been unable to walk a straight line for the last month and is worried about blood flow to her brain. She has been taking all medications as prescribed. She was not on lasix PTA. She is a current smoker. 1 pack lasts about 3 days. She has not had a cath since 2010 when she had PCI in [place].  She presented to [facility] 06/27/2018 with SOB and BLE edema.  Pertinent admission labs include: K 3.7, creatinine 0.81, BNP 954, WBC 8.0, hemoglobin 15.0, ddimer 1.52, CTA negative for PE, TSH 10.2, troponin 0.25 > 0.25 > 0.25, CXR with mod CM, no edema or infiltrate  She was given 20 mg IV lasix x2 and has 600 mls UOP charted. She was started on heparin drip for elevated troponin. 27 beats NSVT this morning. K 3.9 and creatinine 1.01.  She has not had any chest pain. She denies  SOB when up to the bathroom, but does not think she has improved. Swelling slightly better. Continues to have cramps in legs.   Echo 06/22/18: - Left ventricle: The cavity size was mildly dilated. Systolic   function was severely reduced. The estimated ejection fraction   was in the range of 10% to 15%. Diffuse hypokinesis. Doppler   parameters are consistent with abnormal left ventricular   relaxation (grade 1 diastolic dysfunction). Doppler parameters   are consistent with high ventricular filling pressure. - Aortic valve: Transvalvular velocity was within the normal range.   There was no stenosis. There was moderate to severe   regurgitation. - Mitral valve: Transvalvular velocity was within the normal range.   There was no evidence for stenosis. There was mild regurgitation. - Left atrium: The atrium was moderately dilated. - Right ventricle: The cavity size was normal. Wall thickness was   normal. Systolic function was moderately reduced. - Right atrium: The atrium was severely dilated. - Tricuspid valve: There was severe regurgitation. - Pulmonic valve: There was moderate regurgitation. - Pulmonary arteries: Systolic pressure was severely increased. PA   peak pressure: 61 mm Hg (S). - Pericardium, extracardiac: A trivial pericardial effusion was   identified.  SH: current smoker 1 pack lasts 3 days, no ETOH or drug use. She is a retired Press photographer. She lives  in [place] with her husband.  FH: Maternal grandmother with CABG in her 34s, father's side of the family with strong history of CVA  Review of Systems: [y] = yes, [ ]  = no   General: Weight gain [ ] ; Weight loss [ ] ; Anorexia [ ] ; Fatigue Blue.Reese ]; Fever [ ] ; Chills [ ] ; Weakness [ ]   Cardiac: Chest pain/pressure [ ] ; Resting SOB [ ] ; Exertional SOB Blue.Reese ]; Orthopnea [ ] ; Pedal Edema [ ] ; Palpitations [ y]; Syncope [ ] ; Presyncope [ ] ; Paroxysmal nocturnal dyspnea[y ]  Pulmonary: Cough [ ] ; Wheezing[ ] ;  Hemoptysis[ ] ; Sputum [ ] ; Snoring [ ]   GI: Vomiting[ ] ; Dysphagia[ ] ; Melena[ ] ; Hematochezia [ ] ; Heartburn[ ] ; Abdominal pain [ ] ; Constipation [ ] ; Diarrhea [ ] ; BRBPR [ ]   GU: Hematuria[ ] ; Dysuria [ ] ; Nocturia[ ]   Vascular: Pain in legs with walking Blue.Reese ]; Pain in feet with lying flat [ y]; Non-healing sores [ ] ; Stroke [ ] ; TIA [ ] ; Slurred speech [ ] ;  Neuro: Headaches[ ] ; Vertigo[ ] ; Seizures[ ] ; Paresthesias[ ] ;Blurred vision [ ] ; Diplopia [ ] ; Vision changes [ ]   Ortho/Skin: Arthritis [ ] ; Joint pain [ ] ; Muscle pain [ ] ; Joint swelling [ ] ; Back Pain [ ] ; Rash [ ]   Psych: Depression[ ] ; Anxiety[ ]   Heme: Bleeding problems [ ] ; Clotting disorders [ ] ; Anemia [ ]   Endocrine: Diabetes [ ] ; Thyroid dysfunction[y ]  Home Medications Prior to Admission medications   Medication Sig Start Date End Date Taking? Authorizing Provider  aspirin EC 81 MG tablet Take 81 mg by mouth daily.   Yes [provider]  clopidogrel (PLAVIX) 75 MG tablet TAKE 1 TABLET DAILY Patient taking differently: Take 75 mg by mouth daily.  04/30/18  Yes Lorretta Harp, MD  levothyroxine (SYNTHROID, LEVOTHROID) 25 MCG tablet Take 1 tablet (25 mcg total) by mouth daily before breakfast. 06/25/18  Yes Minette Brine, FNP  metoprolol succinate (TOPROL-XL) 50 MG 24 hr tablet Take 1 tablet (50 mg total) by mouth daily. Take with or immediately following a meal. 06/22/18  Yes Minette Brine, FNP  naproxen sodium (ALEVE) 220 MG tablet Take 220-440 mg by mouth 2 (two) times daily as needed (for pain or headaches).   Yes [provider]  ramipril (ALTACE) 5 MG capsule Take 1 capsule (5 mg total) by mouth daily. 06/22/18  Yes Minette Brine, FNP  rosuvastatin (CRESTOR) 10 MG tablet Take 1 tablet (10 mg total) by mouth daily. 06/22/18  Yes Minette Brine, FNP    Past Medical History: Past Medical History:  Diagnosis Date  . Automatic implantable cardioverter-defibrillator in situ 2011   [employer]   . Carotid artery disease ([place])   . Coronary artery disease   . Dysrhythmia   . History of nuclear stress test 04/26/2012   mod-severe perfusion defect in basal inferior septal and basal inf, mid inferoseptal, mid inferio & apical inferior - consistent with infarct/scar; low risk   . Hypercholesteremia   . Hypertension   . ICD (implantable cardioverter-defibrillator) in place 03/08/2016  . PVD (peripheral vascular disease) ([place])   . Silent myocardial infarction ([place]) 2009; 2010  . Tobacco abuse     Past Surgical History: Past Surgical History:  Procedure Laterality Date  . ANGIOPLASTY / STENTING FEMORAL Right 2010  . CARDIAC DEFIBRILLATOR PLACEMENT  04/2010  . Carotid Duplex  04/2012   R ECA - severe vessel narrowing with inc velocities 70-99% diameter reduction; L  subclav - elevated velocities 50-69% diameter reduction; L vertebral - occlusive disease; L bulb/prox ICA - elevated velocities in prox & mid segments on ICA, 50-69% diameter reduction   . CESAREAN SECTION  1979  . COLONOSCOPY  06/11/2012   Procedure: COLONOSCOPY;  Surgeon: Juanita Craver, MD;  Location: [facility] ENDOSCOPY;  Service: Endoscopy;  Laterality: N/A;  . ENDARTERECTOMY Right 2010  . ILIAC ARTERY STENT Bilateral 12/30/2013  . LOWER EXTREMITY ANGIOGRAM Bilateral 12/30/2013   Procedure: LOWER EXTREMITY ANGIOGRAM;  Surgeon: Lorretta Harp, MD;  Location: [facility] CATH LAB;  Service: Cardiovascular;  Laterality: Bilateral;  . Lower Extremity Arterial Doppler  04/2012   ABI 0.63 on R and 0.77 on L  . PERCUTANEOUS CORONARY STENT INTERVENTION (PCI-S)     in [place]  . TRANSTHORACIC ECHOCARDIOGRAM  04/26/2012   EF 30-35%; imparied LV relaxation; mod post wall hypokinesis, mod-severe ant wall kypokinesis and severe septal hypokinesis; RV systolic function mod redcued, with RV systolic pressure elevated; mild AVR    Family History: Family History  Problem Relation Age of Onset  . Leukemia Mother   . Pancreatic cancer Father   .  Diabetes Maternal Grandmother   . Sudden death Maternal Grandfather   . Stroke Paternal Grandfather   . Rheum arthritis Sister   . Hypertension Son     Social History: Social History   Socioeconomic History  . Marital status: Married    Spouse name: Not on file  . Number of children: 3  . Years of education: coolege   . Highest education level: Not on file  Occupational History  . 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 Financial resource strain: Not on file  . Food insecurity:    Worry: Not on file    Inability: Not on file  . Transportation needs:    Medical: Not on file    Non-medical: Not on file  Tobacco Use  . Smoking status: Current Some Day Smoker    Packs/day: 0.12    Years: 46.00    Pack years: 5.52    Types: Cigarettes  . Smokeless tobacco: Never Used  . Tobacco comment: 12/30/2013 "down to less than 1 pack cigarettes/wk"  Substance and Sexual Activity  . Alcohol use: No  . Drug use: No  . Sexual activity: Not Currently  Lifestyle  . Physical activity:    Days per week: Not on file    Minutes per session: Not on file  . Stress: Not on file  Relationships  . Social connections:    Talks on phone: Not on file    Gets together: Not on file    Attends religious service: Not on file    Active member of club or organization: Not on file    Attends meetings of clubs or organizations: Not on file    Relationship status: Not on file  Other Topics Concern  . Not on file  Social History Narrative  . Not on file    Allergies:  Allergies  Allergen Reactions  . Morphine And Related Other (See Comments) and Hypertension    Raises blood pressure, makes the patient "hyper"    Objective:    Vital Signs:   Temp:  [97.6 F (36.4 C)-98.6 F (37 C)] 98.4 F (36.9 C) (01/02 0520) Pulse Rate:  [65-80] 69 (01/02 1029) Resp:  [15-23] 18 (01/02 0520) BP: (109-142)/(64-111) 115/77 (01/02 1029) SpO2:  [96 %-100 %] 100 % (01/02  0520) Weight:  [61 kg-61.6 kg] 61  kg (01/02 0129) Last BM Date: 06/26/18  Weight change: Filed Weights   06/27/18 1635 06/27/18 2234 06/28/18 0129  Weight: 61.2 kg 61.6 kg 61 kg    Intake/Output:   Intake/Output Summary (Last 24 hours) at 06/28/2018 1138 Last data filed at 06/28/2018 1030 Gross per 24 hour  Intake 255.69 ml  Output 600 ml  Net -344.31 ml      Physical Exam    General:  Thin. No resp difficulty HEENT: normal Neck: supple. JVP 10-12. Carotids 2+ bilat; no bruits. No lymphadenopathy or thyromegaly appreciated. Cor: PMI nondisplaced. Regular rate & rhythm. ?s3 Lungs: clear Abdomen: soft, nontender, nondistended. No hepatosplenomegaly. No bruits or masses. Good bowel sounds. Extremities: no cyanosis, clubbing, rash, BLE tender, 1+ ankle edema Neuro: alert & orientedx3, cranial nerves grossly intact. moves all 4 extremities w/o difficulty. Affect pleasant   Telemetry   NSR 60s. 23 beats NSVT this am. Personally reviewed.  EKG    1/2: NSR 71 bpm with incomplete LBBB (112 ms), TWI V5-V6, I-III, and aVF. Looks similar to previous. Personally reviewed.   Labs   Basic Metabolic Panel: Recent Labs  Lab 06/22/18 1000 06/27/18 1730 06/28/18 0414  NA 141 141 142  K 4.4 3.7 3.9  CL 105 112* 111  CO2 21 21* 22  GLUCOSE 98 131* 92  BUN 17 23 16   CREATININE 0.89 0.81 1.01*  CALCIUM 9.0 8.6* 8.6*    Liver Function Tests: Recent Labs  Lab 06/22/18 1000  AST 29  ALT 31  ALKPHOS 77  BILITOT 0.7  PROT 5.6*  ALBUMIN 3.6   No results for input(s): LIPASE, AMYLASE in the last 168 hours. No results for input(s): AMMONIA in the last 168 hours.  CBC: Recent Labs  Lab 06/22/18 1000 06/27/18 1730 06/28/18 0414  WBC 7.5 8.0 8.3  HGB 14.8 15.0 14.8  HCT 46.9* 48.4* 45.7  MCV 88 85.8 84.9  PLT 190 192 196    Cardiac Enzymes: Recent Labs  Lab 06/27/18 2305 06/28/18 0414 06/28/18 0946  TROPONINI 0.25* 0.25* 0.25*    BNP: BNP (last 3  results) Recent Labs    06/27/18 1730  BNP 954.2*    ProBNP (last 3 results) No results for input(s): PROBNP in the last 8760 hours.   CBG: No results for input(s): GLUCAP in the last 168 hours.  Coagulation Studies: No results for input(s): LABPROT, INR in the last 72 hours.   Imaging   Dg Chest 2 View  Result Date: 06/27/2018 CLINICAL DATA:  Swollen extremities, short of breath EXAM: CHEST - 2 VIEW COMPARISON:  CT 06/27/2018, radiograph 12/23/2013 FINDINGS: Left-sided pacing device as before. Interval moderate cardiomegaly without edema. No pleural effusion or focal airspace disease. Aortic atherosclerosis. IMPRESSION: Interval moderate cardiomegaly without edema or infiltrate Electronically Signed   By: Donavan Foil M.D.   On: 06/27/2018 19:52   Ct Angio Chest Pe W And/or Wo Contrast  Result Date: 06/27/2018 CLINICAL DATA:  Bilateral leg swelling EXAM: CT ANGIOGRAPHY CHEST WITH CONTRAST TECHNIQUE: Multidetector CT imaging of the chest was performed using the standard protocol during bolus administration of intravenous contrast. Multiplanar CT image reconstructions and MIPs were obtained to evaluate the vascular anatomy. CONTRAST:  57mL ISOVUE-370 IOPAMIDOL (ISOVUE-370) INJECTION 76% COMPARISON:  Chest x-ray 12/23/2013 FINDINGS: Cardiovascular: Satisfactory opacification of the pulmonary arteries to the segmental level. No evidence of pulmonary embolism. Nonaneurysmal aorta. Moderate aortic atherosclerosis. Coronary vascular calcification. Moderate cardiomegaly with multi chamber enlargement. No pericardial effusion. Reflux of contrast into the hepatic  veins suggesting elevated right heart pressure. Mediastinum/Nodes: Midline trachea. No thyroid mass. No significantly enlarged lymph nodes. Esophagus within normal limits. Lungs/Pleura: No acute consolidation or effusion.  No pneumothorax. Upper Abdomen: No acute abnormality. Musculoskeletal: No acute or suspicious abnormality Review of the  MIP images confirms the above findings. IMPRESSION: 1. Negative for acute pulmonary embolus 2. Moderate cardiomegaly with multi chamber enlargement. Reflux of contrast into the attic veins suggesting elevated right heart pressures. Aortic Atherosclerosis (ICD10-I70.0). Electronically Signed   By: Donavan Foil M.D.   On: 06/27/2018 19:47      Medications:     Current Medications: . aspirin EC  81 mg Oral Daily  . clopidogrel  75 mg Oral Daily  . levothyroxine  25 mcg Oral QAC breakfast  . metoprolol succinate  50 mg Oral QPC breakfast  . ramipril  5 mg Oral Daily  . rosuvastatin  10 mg Oral Daily  . sodium chloride flush  3 mL Intravenous Q12H     Infusions: . sodium chloride    . heparin 750 Units/hr (06/28/18 0249)       Patient Profile   Zorah Backes is a 73 y.o. female with a history of tobacco use, HTN, hyperlipidemia, silent MI 2010 s/p PCI, carotid stenosis s/p right CEA 2010, PAD s/p stent to right common/external iliac artery and left common iliac artery in 0630, chronic systolic HF due to ICM (EF 30-35%), s/p [employer] ICD.  Admitted with worsening SOB and BLE edema.   Assessment/Plan   1. Acute systolic HF due to ICM. EF 06/22/18: EF 10-15% (drop from 30-35% 7 years ago), mod to severe AI, mild MR, LA mod dilated, RV function moderately reduced, severe TR, mod pulm regurg, PA peak pressure 61 mm Hg. Has [employer] ICD. - Volume elevated on exam. - Increase lasix to 40 mg IV BID - Stop BB. Concerned for low output - Start digoxin 0.125 mg daily - Continue ramipril 5 mg daily for now. I do not think BP would tolerate switch to Entresto. SBP 110-120s - Start spiro 12.5 mg qHS. - R/LHC tomorrow. We discussed procedure at length.   2. CAD - Had a silent MI in 2010 with PCI (unsure which artery) in [place] - Troponin 0.25 > 0.25 > 0.25. EKG unchanged.  - Denies any CP, but did not have symptoms during previous MI - LHC scheduled for tomorrow 7:30  am.  Continue heparin for now.  - Continue ASA and plavix. Increase Crestor. See below.  3. PAD/thigh pain - S/p stent to right common/external iliac artery and left common iliac artery in 2015. She has a known right SFA occlusion with three-vessel runoff. - ABI 02/2016: right ABI 0.69, patent iliac stents, left ABI 0.88 with patent iliac stents and moderate disease in the mid left SFA. - She is having a lot of bilateral anterior thigh pain at rest.  - Check ABIs  - Continue ASA, plavix  4. HTN - SBP 110-120s  5. Hypothyroidism - TSH elevated 10.249, but T4 normal this morning. - Continue synthroid  6. Tobacco use - Continues to smoke. Encouraged cessation.   7. Hyperlipidemia - LDL 119 05/2018. Increase Crestor to 40 mg daily (on 10 mg at home). Previously refused referral to lipid clinic.   8. NSVT - K 3.9. Add on mag.   9. Carotid stenosis - Last carotis [place] 02/2016, which showed widely patent endarterectomy with moderate left ICA stenosis. Planned to follow annually.  - Check carotid US  Medication concerns reviewed with patient and pharmacy team. Barriers identified: none at this time.   Length of Stay: [place], NP  06/28/2018, 11:38 AM  Advanced Heart Failure Team Pager [phone] (M-F; 7a - 4p)  Please contact [facility] for night-coverage after hours (4p -7a ) and weekends on [email or web address]  Patient seen with NP, agree with the above note.   Patient was admitted with progressive exertional dyspnea, also leg pain.  No chest pain.  Echo was done with EF down to 15-20%.  CTA chest with no PE.  She still smokes.   On exam, JVP 12-14 cm.  3/6 HSM LLSB. 1+ ankle edema.  Clear lungs.   1. Acute on chronic systolic CHF: Echo 25/63 with EF 10-15%, mild LV dilation, moderately decreased RV systolic function, severe TR, PASP 61 mmHg, moderate to severe AI. EF down compared to last echo which was about 7 years ago (30-35%).  Suspect ischemic cardiomyopathy.  ECG shows  old inferior and anteroseptal MI.  Per history in chart, she had MI with PCI in [place], but she does not remember ever having a heart cath and thinks that she has only had peripheral stents.  On exam, she is volume overloaded.  - Lasix 80 mg IV bid.  - Continue beta blocker, use low dose Coreg 3.125 mg bid.  - Add spironolactone 12.5 daily and digoxin 0.125 daily.  Hold beta blocker with concern for low output.  - Continue ramipril 5 mg daily for now.  Eventually would aim to transition to [facility] (can do this admission if BP remains stable).  - She will need right and left heart catheterization.  She needs coronary angiography to assess CAD.  We discussed risks/benefits and she agrees to procedure.  Will plan for tomorrow.  2. CAD: Suspect ischemic cardiomyopathy.  EF has fallen since last echo.  She denies chest pain.  She does not remember having an MI or PCI, but per history in old notes, she had PCI and MI in [place].  She has a minimal troponin elevation at 0.2 with no trend.  Probably demand ischemia from volume overload.  - Continue ASA and Plavix.  - Increase Crestor to 40 mg daily.  3. Active smoker: I strongly encouraged her to quit.  4. PAD: Patient reports pain in her thighs, not clearly exertional.  S/p stent to right common/external iliac artery and left common iliac artery in 2015. She has a known right SFA occlusion with three-vessel runoff.ABIs today with moderate PAD, in 0.6 range bilaterally.  She will need followup with Dr. Gwenlyn Found at discharge.  5. Carotid artery disease: S/p right CEA.   - Carotid dopplers today.  6. Hypothyroidism: Continue Synthroid.   7. NSVT: Would continue beta blocker, use Coreg 3.125 mg bid.   Loralie Champagne 06/28/2018 3:27 PM

## 2018-06-28 NOTE — Progress Notes (Addendum)
ANTICOAGULATION CONSULT NOTE - Initial Consult  Pharmacy Consult for heparin Indication: chest pain/ACS  Allergies  Allergen Reactions  . Morphine And Related Other (See Comments) and Hypertension    Raises blood pressure, makes the patient "hyper"    Patient Measurements: Height: 5\' 6"  (167.6 cm) Weight: 135 lb 14.4 oz (61.6 kg) IBW/kg (Calculated) : 59.3  Vital Signs: Temp: 97.6 F (36.4 C) (01/01 2234) Temp Source: Oral (01/01 2234) BP: 130/64 (01/01 2234) Pulse Rate: 65 (01/01 2234)  Labs: Recent Labs    06/27/18 1730 06/27/18 2305  HGB 15.0  --   HCT 48.4*  --   PLT 192  --   CREATININE 0.81  --   TROPONINI  --  0.25*    Estimated Creatinine Clearance: 59.6 mL/min (by C-G formula based on SCr of 0.81 mg/dL).   Medical History: Past Medical History:  Diagnosis Date  . Automatic implantable cardioverter-defibrillator in situ 2011   [employer]   . Carotid artery disease ([place])   . Coronary artery disease   . Dysrhythmia   . History of nuclear stress test 04/26/2012   mod-severe perfusion defect in basal inferior septal and basal inf, mid inferoseptal, mid inferio & apical inferior - consistent with infarct/scar; low risk   . Hypercholesteremia   . Hypertension   . ICD (implantable cardioverter-defibrillator) in place 03/08/2016  . PVD (peripheral vascular disease) ([place])   . Silent myocardial infarction ([place]) 2009; 2010  . Tobacco abuse     Medications:  Medications Prior to Admission  Medication Sig Dispense Refill Last Dose  . aspirin EC 81 MG tablet Take 81 mg by mouth daily.   06/27/2018 at 0900  . clopidogrel (PLAVIX) 75 MG tablet TAKE 1 TABLET DAILY (Patient taking differently: Take 75 mg by mouth daily. ) 90 tablet 0 06/27/2018 at 0900  . levothyroxine (SYNTHROID, LEVOTHROID) 25 MCG tablet Take 1 tablet (25 mcg total) by mouth daily before breakfast. 30 tablet 2 06/27/2018 at am  . metoprolol succinate (TOPROL-XL) 50 MG 24 hr tablet Take 1  tablet (50 mg total) by mouth daily. Take with or immediately following a meal. 90 tablet 1 06/27/2018 at 0900  . naproxen sodium (ALEVE) 220 MG tablet Take 220-440 mg by mouth 2 (two) times daily as needed (for pain or headaches).   unk at unk  . ramipril (ALTACE) 5 MG capsule Take 1 capsule (5 mg total) by mouth daily. 90 capsule 1 06/27/2018 at am  . rosuvastatin (CRESTOR) 10 MG tablet Take 1 tablet (10 mg total) by mouth daily. 90 tablet 1 06/27/2018 at Unknown time   Scheduled:  . aspirin EC  81 mg Oral Daily  . clopidogrel  75 mg Oral Daily  . furosemide  20 mg Intravenous Once  . heparin  5,000 Units Subcutaneous Q8H  . levothyroxine  25 mcg Oral QAC breakfast  . metoprolol succinate  50 mg Oral QPC breakfast  . pneumococcal 23 valent vaccine  0.5 mL Intramuscular Tomorrow-1000  . ramipril  5 mg Oral Daily  . rosuvastatin  10 mg Oral Daily  . sodium chloride flush  3 mL Intravenous Q12H   Infusions:  . sodium chloride      Assessment: 73yo female c/o LE pain and swelling, admitted for ICM, initial troponin negative but now increasing, to begin heparin.  Goal of Therapy:  Heparin level 0.3-0.7 units/ml Monitor platelets by anticoagulation protocol: Yes   Plan:  Rec'd SQ heparin <2hr ago so will skip heparin bolus and  start gtt at 750 units/hr and monitor heparin levels and CBC.  Wynona Neat, PharmD, BCPS  06/28/2018,12:29 AM

## 2018-06-28 NOTE — Progress Notes (Signed)
Bilateral carotid duplex exam completed. Please see preliminary notes on CV PROC under chart review. Daemien Fronczak H Akua Blethen(RDMS RVT) Oda Cogan RDMS RVT 06/28/18 4:15 PM

## 2018-06-28 NOTE — Progress Notes (Signed)
ANTICOAGULATION CONSULT NOTE - follow up Pharmacy Consult for heparin Indication: chest pain/ACS  Allergies  Allergen Reactions  . Morphine And Related Other (See Comments) and Hypertension    Raises blood pressure, makes the patient "hyper"    Patient Measurements: Height: 5\' 6"  (167.6 cm) Weight: 134 lb 8 oz (61 kg) IBW/kg (Calculated) : 59.3  Vital Signs: Temp: 98.6 F (37 C) (01/02 1958) Temp Source: Oral (01/02 1958) BP: 82/52 (01/02 1958) Pulse Rate: 67 (01/02 1958)  Labs: Recent Labs    06/27/18 1730 06/27/18 2305 06/28/18 0414 06/28/18 0757 06/28/18 0946 06/28/18 1817  HGB 15.0  --  14.8  --   --   --   HCT 48.4*  --  45.7  --   --   --   PLT 192  --  196  --   --   --   HEPARINUNFRC  --   --   --  0.45  --  0.52  CREATININE 0.81  --  1.01*  --   --   --   TROPONINI  --  0.25* 0.25*  --  0.25*  --     Estimated Creatinine Clearance: 47.8 mL/min (A) (by C-G formula based on SCr of 1.01 mg/dL (H)).   Medical History: Past Medical History:  Diagnosis Date  . Automatic implantable cardioverter-defibrillator in situ 2011   [employer]   . Carotid artery disease ([place])   . Coronary artery disease   . Dysrhythmia   . History of nuclear stress test 04/26/2012   mod-severe perfusion defect in basal inferior septal and basal inf, mid inferoseptal, mid inferio & apical inferior - consistent with infarct/scar; low risk   . Hypercholesteremia   . Hypertension   . ICD (implantable cardioverter-defibrillator) in place 03/08/2016  . PVD (peripheral vascular disease) ([place])   . Silent myocardial infarction ([place]) 2009; 2010  . Tobacco abuse     Medications:  Medications Prior to Admission  Medication Sig Dispense Refill Last Dose  . aspirin EC 81 MG tablet Take 81 mg by mouth daily.   06/27/2018 at 0900  . clopidogrel (PLAVIX) 75 MG tablet TAKE 1 TABLET DAILY (Patient taking differently: Take 75 mg by mouth daily. ) 90 tablet 0 06/27/2018 at 0900  .  levothyroxine (SYNTHROID, LEVOTHROID) 25 MCG tablet Take 1 tablet (25 mcg total) by mouth daily before breakfast. 30 tablet 2 06/27/2018 at am  . metoprolol succinate (TOPROL-XL) 50 MG 24 hr tablet Take 1 tablet (50 mg total) by mouth daily. Take with or immediately following a meal. 90 tablet 1 06/27/2018 at 0900  . naproxen sodium (ALEVE) 220 MG tablet Take 220-440 mg by mouth 2 (two) times daily as needed (for pain or headaches).   unk at unk  . ramipril (ALTACE) 5 MG capsule Take 1 capsule (5 mg total) by mouth daily. 90 capsule 1 06/27/2018 at am  . rosuvastatin (CRESTOR) 10 MG tablet Take 1 tablet (10 mg total) by mouth daily. 90 tablet 1 06/27/2018 at Unknown time   Scheduled:  . aspirin EC  81 mg Oral Daily  . carvedilol  3.125 mg Oral BID WC  . clopidogrel  75 mg Oral Daily  . digoxin  0.125 mg Oral Daily  . furosemide  80 mg Intravenous BID  . levothyroxine  25 mcg Oral QAC breakfast  . ramipril  5 mg Oral Daily  . [START ON 06/29/2018] rosuvastatin  40 mg Oral Daily  . sodium chloride flush  3 mL  Intravenous Q12H  . spironolactone  12.5 mg Oral QHS   Infusions:  . sodium chloride    . heparin 750 Units/hr (06/28/18 0249)    Assessment: 73yo female c/o LE pain and swelling, admitted for ICM, initial troponin negative but was increasing, thus started on IV heparin. Confirmatory heparin level is 0.5 on heparin drip at 750 units/hr. No issues noted.   Goal of Therapy:  Heparin level 0.3-0.7 units/ml Monitor platelets by anticoagulation protocol: Yes   Plan:  Continue Heparin drip at 750 units/hr  Daily heparin level and CBC. Cath planned for tomorrow  Erin Hearing PharmD., BCPS Clinical Pharmacist 06/28/2018 8:16 PM  Please check AMION for all [place] phone numbers After 10:00 PM, call [place] [phone] 06/28/2018,8:15 PM

## 2018-06-28 NOTE — Progress Notes (Signed)
ABI/TBI completed. Please see preliminary notes on CV PROC under chart review.  Zelpha Messing H Sussan Meter(RDMS RVT) 06/28/18 2:28 PM

## 2018-06-28 NOTE — Progress Notes (Signed)
Troponin 0.25, MD notified.,

## 2018-06-28 NOTE — Care Management Note (Signed)
Case Management Note  Patient Details  Name: Andrea Hubbard MRN: [id number] Date of Birth: 12/16/1945  Subjective/Objective:   Cardiomyopathy               Action/Plan: Patient lives at home with spouse; continues to drive, independent of all of her ADL's; Primary Physician: Glendale Chard, MD; has private insurance with Medicare /BCBS with prescription drug coverage; [facility] choice [facility]; patient reports no problem getting her medication; no DME; no needs identified at this time; CM will continue to follow for progression of care.  Expected Discharge Date:  Possibly 07/01/2018              Expected Discharge Plan:  Home/Self Care  Discharge planning Services  CM Consult  Status of Service:  In process, will continue to follow  Royston Bake [place] ,[place] [phone] 06/28/2018, 2:13 PM

## 2018-06-28 NOTE — H&P (View-Only) (Signed)
Advanced Heart Failure Team Consult Note   Primary Physician: Glendale Chard, MD PCP-Cardiologist:  Dr Gwenlyn Found  Reason for Consultation: A/C systolic HF  HPI:    Andrea Hubbard is seen today for evaluation of A/C systolic HF at the request of Dr Georgette Shell.   Andrea Hubbard is a 73 y.o. female with a history of tobacco use, HTN, hyperlipidemia, silent MI 2010 s/p PCI, carotid stenosis s/p right CEA 2010, PAD s/p stent to right common/external iliac artery and left common iliac artery in 9476, chronic systolic HF due to ICM (EF 30-35%), s/p [employer] ICD.  She saw Dr Gwenlyn Found 06/21/2018. He had not seen her in 2 years. She was much more SOB and was complaining of palpitations. A repeat echo was ordered and she was referred to Advanced HF clinic.   Repeat echo showed EF 10-15% (down from 30-35% 7 years ago).  She tells me she has felt poorly over the last month. She has SOB with minimal exertion, fatigue, and palpitations. She also has severe anterior thigh pain at rest and with activity x 1 month. No CP or orthopnea. +PND. Appetite has been okay. No weight gain. She is lightheaded with quick movements. She also says that she has been unable to walk a straight line for the last month and is worried about blood flow to her brain. She has been taking all medications as prescribed. She was not on lasix PTA. She is a current smoker. 1 pack lasts about 3 days. She has not had a cath since 2010 when she had PCI in [place].  She presented to [facility] 06/27/2018 with SOB and BLE edema.  Pertinent admission labs include: K 3.7, creatinine 0.81, BNP 954, WBC 8.0, hemoglobin 15.0, ddimer 1.52, CTA negative for PE, TSH 10.2, troponin 0.25 > 0.25 > 0.25, CXR with mod CM, no edema or infiltrate  She was given 20 mg IV lasix x2 and has 600 mls UOP charted. She was started on heparin drip for elevated troponin. 27 beats NSVT this morning. K 3.9 and creatinine 1.01.  She has not had any chest pain. She denies  SOB when up to the bathroom, but does not think she has improved. Swelling slightly better. Continues to have cramps in legs.   Echo 06/22/18: - Left ventricle: The cavity size was mildly dilated. Systolic   function was severely reduced. The estimated ejection fraction   was in the range of 10% to 15%. Diffuse hypokinesis. Doppler   parameters are consistent with abnormal left ventricular   relaxation (grade 1 diastolic dysfunction). Doppler parameters   are consistent with high ventricular filling pressure. - Aortic valve: Transvalvular velocity was within the normal range.   There was no stenosis. There was moderate to severe   regurgitation. - Mitral valve: Transvalvular velocity was within the normal range.   There was no evidence for stenosis. There was mild regurgitation. - Left atrium: The atrium was moderately dilated. - Right ventricle: The cavity size was normal. Wall thickness was   normal. Systolic function was moderately reduced. - Right atrium: The atrium was severely dilated. - Tricuspid valve: There was severe regurgitation. - Pulmonic valve: There was moderate regurgitation. - Pulmonary arteries: Systolic pressure was severely increased. PA   peak pressure: 61 mm Hg (S). - Pericardium, extracardiac: A trivial pericardial effusion was   identified.  SH: current smoker 1 pack lasts 3 days, no ETOH or drug use. She is a retired Press photographer. She lives  in [place] with her husband.  FH: Maternal grandmother with CABG in her 83s, father's side of the family with strong history of CVA  Review of Systems: [y] = yes, [ ]  = no   General: Weight gain [ ] ; Weight loss [ ] ; Anorexia [ ] ; Fatigue Blue.Reese ]; Fever [ ] ; Chills [ ] ; Weakness [ ]   Cardiac: Chest pain/pressure [ ] ; Resting SOB [ ] ; Exertional SOB Blue.Reese ]; Orthopnea [ ] ; Pedal Edema [ ] ; Palpitations [ y]; Syncope [ ] ; Presyncope [ ] ; Paroxysmal nocturnal dyspnea[y ]  Pulmonary: Cough [ ] ; Wheezing[ ] ;  Hemoptysis[ ] ; Sputum [ ] ; Snoring [ ]   GI: Vomiting[ ] ; Dysphagia[ ] ; Melena[ ] ; Hematochezia [ ] ; Heartburn[ ] ; Abdominal pain [ ] ; Constipation [ ] ; Diarrhea [ ] ; BRBPR [ ]   GU: Hematuria[ ] ; Dysuria [ ] ; Nocturia[ ]   Vascular: Pain in legs with walking Blue.Reese ]; Pain in feet with lying flat [ y]; Non-healing sores [ ] ; Stroke [ ] ; TIA [ ] ; Slurred speech [ ] ;  Neuro: Headaches[ ] ; Vertigo[ ] ; Seizures[ ] ; Paresthesias[ ] ;Blurred vision [ ] ; Diplopia [ ] ; Vision changes [ ]   Ortho/Skin: Arthritis [ ] ; Joint pain [ ] ; Muscle pain [ ] ; Joint swelling [ ] ; Back Pain [ ] ; Rash [ ]   Psych: Depression[ ] ; Anxiety[ ]   Heme: Bleeding problems [ ] ; Clotting disorders [ ] ; Anemia [ ]   Endocrine: Diabetes [ ] ; Thyroid dysfunction[y ]  Home Medications Prior to Admission medications   Medication Sig Start Date End Date Taking? Authorizing Provider  aspirin EC 81 MG tablet Take 81 mg by mouth daily.   Yes [provider]  clopidogrel (PLAVIX) 75 MG tablet TAKE 1 TABLET DAILY Patient taking differently: Take 75 mg by mouth daily.  04/30/18  Yes Lorretta Harp, MD  levothyroxine (SYNTHROID, LEVOTHROID) 25 MCG tablet Take 1 tablet (25 mcg total) by mouth daily before breakfast. 06/25/18  Yes Minette Brine, FNP  metoprolol succinate (TOPROL-XL) 50 MG 24 hr tablet Take 1 tablet (50 mg total) by mouth daily. Take with or immediately following a meal. 06/22/18  Yes Minette Brine, FNP  naproxen sodium (ALEVE) 220 MG tablet Take 220-440 mg by mouth 2 (two) times daily as needed (for pain or headaches).   Yes [provider]  ramipril (ALTACE) 5 MG capsule Take 1 capsule (5 mg total) by mouth daily. 06/22/18  Yes Minette Brine, FNP  rosuvastatin (CRESTOR) 10 MG tablet Take 1 tablet (10 mg total) by mouth daily. 06/22/18  Yes Minette Brine, FNP    Past Medical History: Past Medical History:  Diagnosis Date  . Automatic implantable cardioverter-defibrillator in situ 2011   [employer]   . Carotid artery disease ([place])   . Coronary artery disease   . Dysrhythmia   . History of nuclear stress test 04/26/2012   mod-severe perfusion defect in basal inferior septal and basal inf, mid inferoseptal, mid inferio & apical inferior - consistent with infarct/scar; low risk   . Hypercholesteremia   . Hypertension   . ICD (implantable cardioverter-defibrillator) in place 03/08/2016  . PVD (peripheral vascular disease) ([place])   . Silent myocardial infarction ([place]) 2009; 2010  . Tobacco abuse     Past Surgical History: Past Surgical History:  Procedure Laterality Date  . ANGIOPLASTY / STENTING FEMORAL Right 2010  . CARDIAC DEFIBRILLATOR PLACEMENT  04/2010  . Carotid Duplex  04/2012   R ECA - severe vessel narrowing with inc velocities 70-99% diameter reduction; L  subclav - elevated velocities 50-69% diameter reduction; L vertebral - occlusive disease; L bulb/prox ICA - elevated velocities in prox & mid segments on ICA, 50-69% diameter reduction   . CESAREAN SECTION  1979  . COLONOSCOPY  06/11/2012   Procedure: COLONOSCOPY;  Surgeon: Juanita Craver, MD;  Location: [facility] ENDOSCOPY;  Service: Endoscopy;  Laterality: N/A;  . ENDARTERECTOMY Right 2010  . ILIAC ARTERY STENT Bilateral 12/30/2013  . LOWER EXTREMITY ANGIOGRAM Bilateral 12/30/2013   Procedure: LOWER EXTREMITY ANGIOGRAM;  Surgeon: Lorretta Harp, MD;  Location: [facility] CATH LAB;  Service: Cardiovascular;  Laterality: Bilateral;  . Lower Extremity Arterial Doppler  04/2012   ABI 0.63 on R and 0.77 on L  . PERCUTANEOUS CORONARY STENT INTERVENTION (PCI-S)     in [place]  . TRANSTHORACIC ECHOCARDIOGRAM  04/26/2012   EF 30-35%; imparied LV relaxation; mod post wall hypokinesis, mod-severe ant wall kypokinesis and severe septal hypokinesis; RV systolic function mod redcued, with RV systolic pressure elevated; mild AVR    Family History: Family History  Problem Relation Age of Onset  . Leukemia Mother   . Pancreatic cancer Father   .  Diabetes Maternal Grandmother   . Sudden death Maternal Grandfather   . Stroke Paternal Grandfather   . Rheum arthritis Sister   . Hypertension Son     Social History: Social History   Socioeconomic History  . Marital status: Married    Spouse name: Not on file  . Number of children: 3  . Years of education: coolege   . Highest education level: Not on file  Occupational History  . 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 Financial resource strain: Not on file  . Food insecurity:    Worry: Not on file    Inability: Not on file  . Transportation needs:    Medical: Not on file    Non-medical: Not on file  Tobacco Use  . Smoking status: Current Some Day Smoker    Packs/day: 0.12    Years: 46.00    Pack years: 5.52    Types: Cigarettes  . Smokeless tobacco: Never Used  . Tobacco comment: 12/30/2013 "down to less than 1 pack cigarettes/wk"  Substance and Sexual Activity  . Alcohol use: No  . Drug use: No  . Sexual activity: Not Currently  Lifestyle  . Physical activity:    Days per week: Not on file    Minutes per session: Not on file  . Stress: Not on file  Relationships  . Social connections:    Talks on phone: Not on file    Gets together: Not on file    Attends religious service: Not on file    Active member of club or organization: Not on file    Attends meetings of clubs or organizations: Not on file    Relationship status: Not on file  Other Topics Concern  . Not on file  Social History Narrative  . Not on file    Allergies:  Allergies  Allergen Reactions  . Morphine And Related Other (See Comments) and Hypertension    Raises blood pressure, makes the patient "hyper"    Objective:    Vital Signs:   Temp:  [97.6 F (36.4 C)-98.6 F (37 C)] 98.4 F (36.9 C) (01/02 0520) Pulse Rate:  [65-80] 69 (01/02 1029) Resp:  [15-23] 18 (01/02 0520) BP: (109-142)/(64-111) 115/77 (01/02 1029) SpO2:  [96 %-100 %] 100 % (01/02  0520) Weight:  [61 kg-61.6 kg] 61  kg (01/02 0129) Last BM Date: 06/26/18  Weight change: Filed Weights   06/27/18 1635 06/27/18 2234 06/28/18 0129  Weight: 61.2 kg 61.6 kg 61 kg    Intake/Output:   Intake/Output Summary (Last 24 hours) at 06/28/2018 1138 Last data filed at 06/28/2018 1030 Gross per 24 hour  Intake 255.69 ml  Output 600 ml  Net -344.31 ml      Physical Exam    General:  Thin. No resp difficulty HEENT: normal Neck: supple. JVP 10-12. Carotids 2+ bilat; no bruits. No lymphadenopathy or thyromegaly appreciated. Cor: PMI nondisplaced. Regular rate & rhythm. ?s3 Lungs: clear Abdomen: soft, nontender, nondistended. No hepatosplenomegaly. No bruits or masses. Good bowel sounds. Extremities: no cyanosis, clubbing, rash, BLE tender, 1+ ankle edema Neuro: alert & orientedx3, cranial nerves grossly intact. moves all 4 extremities w/o difficulty. Affect pleasant   Telemetry   NSR 60s. 23 beats NSVT this am. Personally reviewed.  EKG    1/2: NSR 71 bpm with incomplete LBBB (112 ms), TWI V5-V6, I-III, and aVF. Looks similar to previous. Personally reviewed.   Labs   Basic Metabolic Panel: Recent Labs  Lab 06/22/18 1000 06/27/18 1730 06/28/18 0414  NA 141 141 142  K 4.4 3.7 3.9  CL 105 112* 111  CO2 21 21* 22  GLUCOSE 98 131* 92  BUN 17 23 16   CREATININE 0.89 0.81 1.01*  CALCIUM 9.0 8.6* 8.6*    Liver Function Tests: Recent Labs  Lab 06/22/18 1000  AST 29  ALT 31  ALKPHOS 77  BILITOT 0.7  PROT 5.6*  ALBUMIN 3.6   No results for input(s): LIPASE, AMYLASE in the last 168 hours. No results for input(s): AMMONIA in the last 168 hours.  CBC: Recent Labs  Lab 06/22/18 1000 06/27/18 1730 06/28/18 0414  WBC 7.5 8.0 8.3  HGB 14.8 15.0 14.8  HCT 46.9* 48.4* 45.7  MCV 88 85.8 84.9  PLT 190 192 196    Cardiac Enzymes: Recent Labs  Lab 06/27/18 2305 06/28/18 0414 06/28/18 0946  TROPONINI 0.25* 0.25* 0.25*    BNP: BNP (last 3  results) Recent Labs    06/27/18 1730  BNP 954.2*    ProBNP (last 3 results) No results for input(s): PROBNP in the last 8760 hours.   CBG: No results for input(s): GLUCAP in the last 168 hours.  Coagulation Studies: No results for input(s): LABPROT, INR in the last 72 hours.   Imaging   Dg Chest 2 View  Result Date: 06/27/2018 CLINICAL DATA:  Swollen extremities, short of breath EXAM: CHEST - 2 VIEW COMPARISON:  CT 06/27/2018, radiograph 12/23/2013 FINDINGS: Left-sided pacing device as before. Interval moderate cardiomegaly without edema. No pleural effusion or focal airspace disease. Aortic atherosclerosis. IMPRESSION: Interval moderate cardiomegaly without edema or infiltrate Electronically Signed   By: Donavan Foil M.D.   On: 06/27/2018 19:52   Ct Angio Chest Pe W And/or Wo Contrast  Result Date: 06/27/2018 CLINICAL DATA:  Bilateral leg swelling EXAM: CT ANGIOGRAPHY CHEST WITH CONTRAST TECHNIQUE: Multidetector CT imaging of the chest was performed using the standard protocol during bolus administration of intravenous contrast. Multiplanar CT image reconstructions and MIPs were obtained to evaluate the vascular anatomy. CONTRAST:  52mL ISOVUE-370 IOPAMIDOL (ISOVUE-370) INJECTION 76% COMPARISON:  Chest x-ray 12/23/2013 FINDINGS: Cardiovascular: Satisfactory opacification of the pulmonary arteries to the segmental level. No evidence of pulmonary embolism. Nonaneurysmal aorta. Moderate aortic atherosclerosis. Coronary vascular calcification. Moderate cardiomegaly with multi chamber enlargement. No pericardial effusion. Reflux of contrast into the hepatic  veins suggesting elevated right heart pressure. Mediastinum/Nodes: Midline trachea. No thyroid mass. No significantly enlarged lymph nodes. Esophagus within normal limits. Lungs/Pleura: No acute consolidation or effusion.  No pneumothorax. Upper Abdomen: No acute abnormality. Musculoskeletal: No acute or suspicious abnormality Review of the  MIP images confirms the above findings. IMPRESSION: 1. Negative for acute pulmonary embolus 2. Moderate cardiomegaly with multi chamber enlargement. Reflux of contrast into the attic veins suggesting elevated right heart pressures. Aortic Atherosclerosis (ICD10-I70.0). Electronically Signed   By: Donavan Foil M.D.   On: 06/27/2018 19:47      Medications:     Current Medications: . aspirin EC  81 mg Oral Daily  . clopidogrel  75 mg Oral Daily  . levothyroxine  25 mcg Oral QAC breakfast  . metoprolol succinate  50 mg Oral QPC breakfast  . ramipril  5 mg Oral Daily  . rosuvastatin  10 mg Oral Daily  . sodium chloride flush  3 mL Intravenous Q12H     Infusions: . sodium chloride    . heparin 750 Units/hr (06/28/18 0249)       Patient Profile   Andrea Hubbard is a 73 y.o. female with a history of tobacco use, HTN, hyperlipidemia, silent MI 2010 s/p PCI, carotid stenosis s/p right CEA 2010, PAD s/p stent to right common/external iliac artery and left common iliac artery in 7253, chronic systolic HF due to ICM (EF 30-35%), s/p [employer] ICD.  Admitted with worsening SOB and BLE edema.   Assessment/Plan   1. Acute systolic HF due to ICM. EF 06/22/18: EF 10-15% (drop from 30-35% 7 years ago), mod to severe AI, mild MR, LA mod dilated, RV function moderately reduced, severe TR, mod pulm regurg, PA peak pressure 61 mm Hg. Has [employer] ICD. - Volume elevated on exam. - Increase lasix to 40 mg IV BID - Stop BB. Concerned for low output - Start digoxin 0.125 mg daily - Continue ramipril 5 mg daily for now. I do not think BP would tolerate switch to Entresto. SBP 110-120s - Start spiro 12.5 mg qHS. - R/LHC tomorrow. We discussed procedure at length.   2. CAD - Had a silent MI in 2010 with PCI (unsure which artery) in [place] - Troponin 0.25 > 0.25 > 0.25. EKG unchanged.  - Denies any CP, but did not have symptoms during previous MI - LHC scheduled for tomorrow 7:30  am.  Continue heparin for now.  - Continue ASA and plavix. Increase Crestor. See below.  3. PAD/thigh pain - S/p stent to right common/external iliac artery and left common iliac artery in 2015. She has a known right SFA occlusion with three-vessel runoff. - ABI 02/2016: right ABI 0.69, patent iliac stents, left ABI 0.88 with patent iliac stents and moderate disease in the mid left SFA. - She is having a lot of bilateral anterior thigh pain at rest.  - Check ABIs  - Continue ASA, plavix  4. HTN - SBP 110-120s  5. Hypothyroidism - TSH elevated 10.249, but T4 normal this morning. - Continue synthroid  6. Tobacco use - Continues to smoke. Encouraged cessation.   7. Hyperlipidemia - LDL 119 05/2018. Increase Crestor to 40 mg daily (on 10 mg at home). Previously refused referral to lipid clinic.   8. NSVT - K 3.9. Add on mag.   9. Carotid stenosis - Last carotis [place] 02/2016, which showed widely patent endarterectomy with moderate left ICA stenosis. Planned to follow annually.  - Check carotid US  Medication concerns reviewed with patient and pharmacy team. Barriers identified: none at this time.   Length of Stay: [place], NP  06/28/2018, 11:38 AM  Advanced Heart Failure Team Pager [phone] (M-F; 7a - 4p)  Please contact [facility] for night-coverage after hours (4p -7a ) and weekends on [email or web address]  Patient seen with NP, agree with the above note.   Patient was admitted with progressive exertional dyspnea, also leg pain.  No chest pain.  Echo was done with EF down to 15-20%.  CTA chest with no PE.  She still smokes.   On exam, JVP 12-14 cm.  3/6 HSM LLSB. 1+ ankle edema.  Clear lungs.   1. Acute on chronic systolic CHF: Echo 70/01 with EF 10-15%, mild LV dilation, moderately decreased RV systolic function, severe TR, PASP 61 mmHg, moderate to severe AI. EF down compared to last echo which was about 7 years ago (30-35%).  Suspect ischemic cardiomyopathy.  ECG shows  old inferior and anteroseptal MI.  Per history in chart, she had MI with PCI in [place], but she does not remember ever having a heart cath and thinks that she has only had peripheral stents.  On exam, she is volume overloaded.  - Lasix 80 mg IV bid.  - Continue beta blocker, use low dose Coreg 3.125 mg bid.  - Add spironolactone 12.5 daily and digoxin 0.125 daily.  Hold beta blocker with concern for low output.  - Continue ramipril 5 mg daily for now.  Eventually would aim to transition to [facility] (can do this admission if BP remains stable).  - She will need right and left heart catheterization.  She needs coronary angiography to assess CAD.  We discussed risks/benefits and she agrees to procedure.  Will plan for tomorrow.  2. CAD: Suspect ischemic cardiomyopathy.  EF has fallen since last echo.  She denies chest pain.  She does not remember having an MI or PCI, but per history in old notes, she had PCI and MI in [place].  She has a minimal troponin elevation at 0.2 with no trend.  Probably demand ischemia from volume overload.  - Continue ASA and Plavix.  - Increase Crestor to 40 mg daily.  3. Active smoker: I strongly encouraged her to quit.  4. PAD: Patient reports pain in her thighs, not clearly exertional.  S/p stent to right common/external iliac artery and left common iliac artery in 2015. She has a known right SFA occlusion with three-vessel runoff.ABIs today with moderate PAD, in 0.6 range bilaterally.  She will need followup with Dr. Gwenlyn Found at discharge.  5. Carotid artery disease: S/p right CEA.   - Carotid dopplers today.  6. Hypothyroidism: Continue Synthroid.   7. NSVT: Would continue beta blocker, use Coreg 3.125 mg bid.   Loralie Champagne 06/28/2018 3:27 PM

## 2018-06-29 ENCOUNTER — Inpatient Hospital Stay (HOSPITAL_COMMUNITY): Payer: Medicare Other

## 2018-06-29 ENCOUNTER — Encounter (HOSPITAL_COMMUNITY): Payer: Self-pay | Admitting: Cardiology

## 2018-06-29 ENCOUNTER — Ambulatory Visit (HOSPITAL_COMMUNITY): Payer: Medicare Other

## 2018-06-29 ENCOUNTER — Inpatient Hospital Stay (HOSPITAL_COMMUNITY)
Admission: EM | Disposition: A | Discharge status: Home / Self Care | Payer: Self-pay | Source: Home / Self Care | Attending: Cardiology

## 2018-06-29 DIAGNOSIS — I509 Heart failure, unspecified: Secondary | ICD-10-CM

## 2018-06-29 DIAGNOSIS — I472 Ventricular tachycardia: Secondary | ICD-10-CM

## 2018-06-29 DIAGNOSIS — I739 Peripheral vascular disease, unspecified: Secondary | ICD-10-CM

## 2018-06-29 HISTORY — PX: RIGHT/LEFT HEART CATH AND CORONARY ANGIOGRAPHY: CATH118266

## 2018-06-29 LAB — POCT I-STAT 3, VENOUS BLOOD GAS (G3P V)
ACID-BASE EXCESS: 2 mmol/L (ref 0.0–2.0)
Acid-Base Excess: 1 mmol/L (ref 0.0–2.0)
Bicarbonate: 26.2 mmol/L (ref 20.0–28.0)
Bicarbonate: 27.5 mmol/L (ref 20.0–28.0)
O2 SAT: 59 %
O2 Saturation: 59 %
PH VEN: 7.399 (ref 7.250–7.430)
TCO2: 28 mmol/L (ref 22–32)
TCO2: 29 mmol/L (ref 22–32)
pCO2, Ven: 42.7 mmHg — ABNORMAL LOW (ref 44.0–60.0)
pCO2, Ven: 44.4 mmHg (ref 44.0–60.0)
pH, Ven: 7.397 (ref 7.250–7.430)
pO2, Ven: 31 mmHg — CL (ref 32.0–45.0)
pO2, Ven: 31 mmHg — CL (ref 32.0–45.0)

## 2018-06-29 LAB — BASIC METABOLIC PANEL
Anion gap: 10 (ref 5–15)
BUN: 16 mg/dL (ref 8–23)
CO2: 23 mmol/L (ref 22–32)
Calcium: 8.3 mg/dL — ABNORMAL LOW (ref 8.9–10.3)
Chloride: 104 mmol/L (ref 98–111)
Creatinine, Ser: 0.98 mg/dL (ref 0.44–1.00)
GFR calc non Af Amer: 58 mL/min — ABNORMAL LOW (ref >60)
Glucose, Bld: 94 mg/dL (ref 70–99)
Potassium: 3.1 mmol/L — ABNORMAL LOW (ref 3.5–5.1)
Sodium: 137 mmol/L (ref 135–145)

## 2018-06-29 LAB — CBC
HCT: 45.6 % (ref 36.0–46.0)
Hemoglobin: 14.6 g/dL (ref 12.0–15.0)
MCH: 26.7 pg (ref 26.0–34.0)
MCHC: 32 g/dL (ref 30.0–36.0)
MCV: 83.4 fL (ref 80.0–100.0)
Platelets: 165 10*3/uL (ref 150–400)
RBC: 5.47 MIL/uL — ABNORMAL HIGH (ref 3.87–5.11)
RDW: 14.4 % (ref 11.5–15.5)
WBC: 8.6 10*3/uL (ref 4.0–10.5)
nRBC: 0 % (ref 0.0–0.2)

## 2018-06-29 LAB — HEPARIN LEVEL (UNFRACTIONATED): Heparin Unfractionated: 0.4 IU/mL (ref 0.30–0.70)

## 2018-06-29 LAB — MAGNESIUM: Magnesium: 1.5 mg/dL — ABNORMAL LOW (ref 1.7–2.4)

## 2018-06-29 SURGERY — RIGHT/LEFT HEART CATH AND CORONARY ANGIOGRAPHY
Anesthesia: LOCAL

## 2018-06-29 MED ORDER — HEPARIN SODIUM (PORCINE) 1000 UNIT/ML IJ SOLN
INTRAMUSCULAR | Status: DC | PRN
  Administered 2018-06-29: 3000 [IU] via INTRAVENOUS

## 2018-06-29 MED ORDER — SODIUM CHLORIDE 0.9 % IV SOLN
INTRAVENOUS | Status: DC
  Administered 2018-06-29: 07:00:00 via INTRAVENOUS

## 2018-06-29 MED ORDER — IOHEXOL 350 MG/ML SOLN
INTRAVENOUS | Status: DC | PRN
  Administered 2018-06-29: 55 mL via INTRACARDIAC

## 2018-06-29 MED ORDER — ONDANSETRON HCL 4 MG/2ML IJ SOLN: 4.0000 mg | Freq: Four times a day (QID) | INTRAMUSCULAR | Status: DC | PRN

## 2018-06-29 MED ORDER — SODIUM CHLORIDE 0.9% FLUSH: 3.0000 mL | INTRAVENOUS | Status: DC | PRN

## 2018-06-29 MED ORDER — FENTANYL CITRATE (PF) 100 MCG/2ML IJ SOLN
INTRAMUSCULAR | Status: DC | PRN
  Administered 2018-06-29 (×2): 25 ug via INTRAVENOUS

## 2018-06-29 MED ORDER — VERAPAMIL HCL 2.5 MG/ML IV SOLN
INTRAVENOUS | Status: AC
  Filled 2018-06-29: qty 2

## 2018-06-29 MED ORDER — MIDAZOLAM HCL 2 MG/2ML IJ SOLN
INTRAMUSCULAR | Status: DC | PRN
  Administered 2018-06-29: 0.5 mg via INTRAVENOUS
  Administered 2018-06-29: 1 mg via INTRAVENOUS

## 2018-06-29 MED ORDER — FENTANYL CITRATE (PF) 100 MCG/2ML IJ SOLN
INTRAMUSCULAR | Status: AC
  Filled 2018-06-29: qty 2

## 2018-06-29 MED ORDER — SPIRONOLACTONE 25 MG PO TABS
25.0000 mg | ORAL_TABLET | Freq: Every day | ORAL | Status: DC
  Administered 2018-06-29: 25 mg via ORAL
  Filled 2018-06-29: qty 1

## 2018-06-29 MED ORDER — LIDOCAINE HCL (PF) 1 % IJ SOLN
INTRAMUSCULAR | Status: DC | PRN
  Administered 2018-06-29 (×2): 2 mL

## 2018-06-29 MED ORDER — HEPARIN (PORCINE) IN NACL 1000-0.9 UT/500ML-% IV SOLN
INTRAVENOUS | Status: DC | PRN
  Administered 2018-06-29 (×2): 500 mL

## 2018-06-29 MED ORDER — HEPARIN SODIUM (PORCINE) 5000 UNIT/ML IJ SOLN
5000.0000 [IU] | Freq: Three times a day (TID) | INTRAMUSCULAR | Status: DC
  Administered 2018-06-29 – 2018-06-30 (×2): 5000 [IU] via SUBCUTANEOUS
  Filled 2018-06-29 (×3): qty 1

## 2018-06-29 MED ORDER — SODIUM CHLORIDE 0.9 % IV SOLN: 250.0000 mL | INTRAVENOUS | Status: DC | PRN

## 2018-06-29 MED ORDER — POTASSIUM CHLORIDE CRYS ER 20 MEQ PO TBCR
40.0000 meq | EXTENDED_RELEASE_TABLET | Freq: Once | ORAL | Status: AC
  Administered 2018-06-29: 40 meq via ORAL
  Filled 2018-06-29: qty 2

## 2018-06-29 MED ORDER — HEPARIN (PORCINE) IN NACL 1000-0.9 UT/500ML-% IV SOLN
INTRAVENOUS | Status: AC
  Filled 2018-06-29: qty 1000

## 2018-06-29 MED ORDER — MIDAZOLAM HCL 2 MG/2ML IJ SOLN
INTRAMUSCULAR | Status: AC
  Filled 2018-06-29: qty 2

## 2018-06-29 MED ORDER — TORSEMIDE 20 MG PO TABS
20.0000 mg | ORAL_TABLET | Freq: Every day | ORAL | Status: DC
  Administered 2018-06-29 – 2018-06-30 (×2): 20 mg via ORAL
  Filled 2018-06-29 (×2): qty 1

## 2018-06-29 MED ORDER — LIDOCAINE HCL (PF) 1 % IJ SOLN
INTRAMUSCULAR | Status: AC
  Filled 2018-06-29: qty 30

## 2018-06-29 MED ORDER — MAGNESIUM SULFATE 4 GM/100ML IV SOLN
4.0000 g | Freq: Once | INTRAVENOUS | Status: AC
  Administered 2018-06-29: 4 g via INTRAVENOUS
  Filled 2018-06-29: qty 100

## 2018-06-29 MED ORDER — ASPIRIN 81 MG PO CHEW
81.0000 mg | CHEWABLE_TABLET | ORAL | Status: AC
  Administered 2018-06-29: 81 mg via ORAL
  Filled 2018-06-29: qty 1

## 2018-06-29 MED ORDER — SODIUM CHLORIDE 0.9% FLUSH: 3.0000 mL | Freq: Two times a day (BID) | INTRAVENOUS | Status: DC

## 2018-06-29 MED ORDER — ACETAMINOPHEN 325 MG PO TABS: 650.0000 mg | ORAL_TABLET | ORAL | Status: DC | PRN

## 2018-06-29 MED ORDER — VERAPAMIL HCL 2.5 MG/ML IV SOLN
INTRAVENOUS | Status: DC | PRN
  Administered 2018-06-29: 08:00:00 via INTRA_ARTERIAL

## 2018-06-29 SURGICAL SUPPLY — 12 items
CATH 5FR JL3.5 JR4 ANG PIG MP (CATHETERS) ×2 IMPLANT
CATH BALLN WEDGE 5F 110CM (CATHETERS) ×2 IMPLANT
DEVICE RAD COMP TR BAND LRG (VASCULAR PRODUCTS) ×2 IMPLANT
GLIDESHEATH SLEND SS 6F .021 (SHEATH) ×2 IMPLANT
GUIDEWIRE INQWIRE 1.5J.035X260 (WIRE) ×1 IMPLANT
INQWIRE 1.5J .035X260CM (WIRE) ×2
KIT HEART LEFT (KITS) ×2 IMPLANT
PACK CARDIAC CATHETERIZATION (CUSTOM PROCEDURE TRAY) ×2 IMPLANT
SHEATH GLIDE SLENDER 4/5FR (SHEATH) ×2 IMPLANT
TRANSDUCER W/STOPCOCK (MISCELLANEOUS) ×2 IMPLANT
TUBING CIL FLEX 10 FLL-RA (TUBING) ×2 IMPLANT
WIRE HI TORQ VERSACORE-J 145CM (WIRE) ×2 IMPLANT

## 2018-06-29 NOTE — Progress Notes (Signed)
AD returned pt belongings from safe Verified with family, confirmed all belongings present  Educated pt family to take valuables home  Pt husband took home

## 2018-06-29 NOTE — Progress Notes (Signed)
Pt returned from cath lab  Frequent vital signs and telemetry connected  Pt family at bedside  Right radial site clean, dry and intact  Educated pt on plan of care  Paged MD for diet order

## 2018-06-29 NOTE — Progress Notes (Signed)
Came to see pt however VAD coordinator is with pt. Pt asks to walk tomorrow. Will f/u as time allows tomorrow. Yves Dill CES, ACSM 2:08 PM 06/29/2018

## 2018-06-29 NOTE — Progress Notes (Signed)
Advanced Heart Failure Rounding Note  PCP-Cardiologist: No primary care provider on file.   Subjective:    Brisk diuresis with I/O -3.7 L. Weight down 6 lbs overnight. Creatinine stable at 0.98.  She was started on spiro and dig yesterday. SBP 82-112.  RHC/LHC (06/29/18):  Coronary Findings   Diagnostic  Dominance: Right  Left Anterior Descending  40% proximal LAD stenosis. Luminal irregularities throughout the rest of the LAD. Small to moderate D1 with 95% ostial stenosis. Small to moderate D2 with 70% ostial stenosis. Small D3 with 99% proximal stenosis.  Left Circumflex  60-70% ostial stenosis in small to moderate OM1. Patent stent proximally in large OM2. Luminal irregularities throughout the LCx.  Right Coronary Artery  Occluded at the ostium with left to right collaterals.  Intervention   No interventions have been documented.  Right Heart   Right Heart Pressures RHC Procedural Findings: Hemodynamics (mmHg) RA mean 6 RV 45/8 PA 40/16, mean 25 PCWP mean 12 LV 128/16 AO 131/52  Oxygen saturations: PA 59% AO 99%  Cardiac Output (Fick) 2.8  Cardiac Index (Fick) 1.7 PVR 4.6 WU     Carotid US 06/28/18: Right Carotid: Velocities in the right ICA are consistent with a 1-39% stenosis. Left Carotid: Velocities in the left ICA are consistent with a 40-59% stenosis. Vertebrals: Bilateral vertebral arteries demonstrate antegrade flow.  ABIs 06/28/18: Right: Resting right ankle-brachial index indicates moderate right lower extremity arterial disease. (0.63) The right toe-brachial index is abnormal. Left: Resting left ankle-brachial index indicates moderate left lower extremity arterial disease. (0.69) The left toe-brachial index is abnormal.  Objective:   Weight Range: 58.3 kg Body mass index is 20.76 kg/m.   Vital Signs:   Temp:  [97.8 F (36.6 C)-99.8 F (37.7 C)] 99.8 F (37.7 C) (01/03 0103) Pulse Rate:  [67-69] 68 (01/03 0103) Resp:  [18] 18 (01/03  0103) BP: (82-115)/(52-77) 98/60 (01/03 0103) SpO2:  [91 %-98 %] 95 % (01/03 0103) Weight:  [58.3 kg] 58.3 kg (01/03 0105) Last BM Date: 06/26/18  Weight change: Filed Weights   06/27/18 2234 06/28/18 0129 06/29/18 0105  Weight: 61.6 kg 61 kg 58.3 kg    Intake/Output:   Intake/Output Summary (Last 24 hours) at 06/29/2018 0714 Last data filed at 06/29/2018 0600 Gross per 24 hour  Intake 243 ml  Output 3950 ml  Net -3707 ml      Physical Exam    General:  Well appearing. No resp difficulty HEENT: Normal Neck: Supple. JVP not elevated. Carotids 2+ bilat; no bruits. No lymphadenopathy or thyromegaly appreciated. Cor: PMI nondisplaced. Regular rate & rhythm. 3/6 HSM LLSB Lungs: Clear Abdomen: Soft, nontender, nondistended. No hepatosplenomegaly. No bruits or masses. Good bowel sounds. Extremities: No cyanosis, clubbing, rash, edema Neuro: Alert & orientedx3, cranial nerves grossly intact. moves all 4 extremities w/o difficulty. Affect pleasant   Telemetry   NSR 70s. No further NSVT. Personally reviewed.   EKG    No new tracings.   Labs    CBC Recent Labs    06/28/18 0414 06/29/18 0531  WBC 8.3 8.6  HGB 14.8 14.6  HCT 45.7 45.6  MCV 84.9 83.4  PLT 196 563   Basic Metabolic Panel Recent Labs    06/27/18 1730 06/28/18 0414  NA 141 142  K 3.7 3.9  CL 112* 111  CO2 21* 22  GLUCOSE 131* 92  BUN 23 16  CREATININE 0.81 1.01*  CALCIUM 8.6* 8.6*  MG  --  1.6*   Liver Function  Tests No results for input(s): AST, ALT, ALKPHOS, BILITOT, PROT, ALBUMIN in the last 72 hours. No results for input(s): LIPASE, AMYLASE in the last 72 hours. Cardiac Enzymes Recent Labs    06/27/18 2305 06/28/18 0414 06/28/18 0946  TROPONINI 0.25* 0.25* 0.25*    BNP: BNP (last 3 results) Recent Labs    06/27/18 1730  BNP 954.2*    ProBNP (last 3 results) No results for input(s): PROBNP in the last 8760 hours.   D-Dimer Recent Labs    06/27/18 1730  DDIMER 1.52*    Hemoglobin A1C No results for input(s): HGBA1C in the last 72 hours. Fasting Lipid Panel No results for input(s): CHOL, HDL, LDLCALC, TRIG, CHOLHDL, LDLDIRECT in the last 72 hours. Thyroid Function Tests Recent Labs    06/27/18 2305  TSH 10.249*    Other results:   Imaging    Vas [place] Abi With/wo Tbi  Result Date: 06/28/2018 LOWER EXTREMITY DOPPLER STUDY Indications: Peripheral artery disease. High Risk Factors: Current smoker.  Comparison Study: ABI/TBI on 03/16/2016, data is not available Performing Technologist: COLE, Hongying RDMS, RVT  Examination Guidelines: A complete evaluation includes at minimum, Doppler waveform signals and systolic blood pressure reading at the level of bilateral brachial, anterior tibial, and posterior tibial arteries, when vessel segments are accessible. Bilateral testing is considered an integral part of a complete examination. Photoelectric Plethysmograph (PPG) waveforms and toe systolic pressure readings are included as required and additional duplex testing as needed. Limited examinations for reoccurring indications may be performed as noted.  ABI Findings: +---------+------------------+-----+-------------------+--------+ Right    Rt Pressure (mmHg)IndexWaveform           Comment  +---------+------------------+-----+-------------------+--------+ Brachial 135                    triphasic                   +---------+------------------+-----+-------------------+--------+ PTA      85                0.63 monophasic                  +---------+------------------+-----+-------------------+--------+ DP       80                0.59 dampened monophasic         +---------+------------------+-----+-------------------+--------+ Great Toe62                0.46                             +---------+------------------+-----+-------------------+--------+ +---------+------------------+-----+-------------------+-------+ Left     Lt Pressure  (mmHg)IndexWaveform           Comment +---------+------------------+-----+-------------------+-------+ Brachial 121                    biphasic                   +---------+------------------+-----+-------------------+-------+ PTA      93                0.69 monophasic                 +---------+------------------+-----+-------------------+-------+ DP       83                0.61 dampened monophasic        +---------+------------------+-----+-------------------+-------+ [place]  0.43                            +---------+------------------+-----+-------------------+-------+ +-------+-----------+-----------+------------+------------+ ABI/TBIToday's ABIToday's TBIPrevious ABIPrevious TBI +-------+-----------+-----------+------------+------------+ Right  0.63       0.46                                +-------+-----------+-----------+------------+------------+ Left   0.69       0.43                                +-------+-----------+-----------+------------+------------+  Summary: Right: Resting right ankle-brachial index indicates moderate right lower extremity arterial disease. The right toe-brachial index is abnormal. Left: Resting left ankle-brachial index indicates moderate left lower extremity arterial disease. The left toe-brachial index is abnormal.  *See table(s) above for measurements and observations.  Electronically signed by Curt Jews MD on 06/28/2018 at 5:35:18 PM.    Final    Vas US Carotid  Result Date: 06/28/2018 Carotid Arterial Duplex Study Indications:       Carotid stenosis. Right Endarterectomy on 2010. Risk Factors:      Current smoker. Comparison Study:  Carotid duplex exam on 2017 Performing Technologist: Rudell Cobb Supporting Technologist: Oda Cogan RDMS, RVT  Examination Guidelines: A complete evaluation includes B-mode imaging, spectral Doppler, color Doppler, and power Doppler as needed of all accessible portions of  each vessel. Bilateral testing is considered an integral part of a complete examination. Limited examinations for reoccurring indications may be performed as noted.  Right Carotid Findings: +----------+--------+--------+--------+------------------+---------------------+           PSV cm/sEDV cm/sStenosisDescribe          Comments              +----------+--------+--------+--------+------------------+---------------------+ CCA Prox  89      15              diffuse,          intimal irregular                                       heterogenous and  thickening vs                                           irregular         irregular plaques     +----------+--------+--------+--------+------------------+---------------------+ CCA Mid                                             intimal questionable                                                      tearing?              +----------+--------+--------+--------+------------------+---------------------+ CCA [id number]     21              heterogenous                            +----------+--------+--------+--------+------------------+---------------------+  ICA Prox  131     23      1-39%   heterogenous      post endarterectomy   +----------+--------+--------+--------+------------------+---------------------+ ICA Mid   74      16                                                      +----------+--------+--------+--------+------------------+---------------------+ ICA Distal108     29                                                      +----------+--------+--------+--------+------------------+---------------------+ ECA       103                                                             +----------+--------+--------+--------+------------------+---------------------+ +----------+--------+-------+--------+-------------------+           PSV cm/sEDV cmsDescribeArm Pressure (mmHG)  +----------+--------+-------+--------+-------------------+ [id number]                                        +----------+--------+-------+--------+-------------------+ +---------+--------+--+--------+--+---------+ VertebralPSV cm/s65EDV cm/s16Antegrade +---------+--------+--+--------+--+---------+  Left Carotid Findings: +----------+--------+--------+--------+-------------------+--------------------+           PSV cm/sEDV cm/sStenosisDescribe           Comments             +----------+--------+--------+--------+-------------------+--------------------+ CCA Prox  66      14                                 intimal thickening                                                        with irregular                                                            plaque debris        +----------+--------+--------+--------+-------------------+--------------------+ CCA Mid   59      12                                 irregular intimal  thickening           +----------+--------+--------+--------+-------------------+--------------------+ CCA Distal58      12                                 intimal irregular    +----------+--------+--------+--------+-------------------+--------------------+ ICA Prox  189     42      40-59%  calcific and                                                              irregular                               +----------+--------+--------+--------+-------------------+--------------------+ ICA Mid   97      17              calcific                                +----------+--------+--------+--------+-------------------+--------------------+ ICA Distal81      18                                                      +----------+--------+--------+--------+-------------------+--------------------+ ECA       342             >50%                                             +----------+--------+--------+--------+-------------------+--------------------+ +----------+--------+--------+--------+-------------------+ SubclavianPSV cm/sEDV cm/sDescribeArm Pressure (mmHG) +----------+--------+--------+--------+-------------------+           165                                         +----------+--------+--------+--------+-------------------+ +---------+--------+--+--------+--+---------+ VertebralPSV cm/s28EDV cm/s13Antegrade +---------+--------+--+--------+--+---------+  Summary: Right Carotid: Velocities in the right ICA are consistent with a 1-39% stenosis. Left Carotid: Velocities in the left ICA are consistent with a 40-59% stenosis. Vertebrals: Bilateral vertebral arteries demonstrate antegrade flow. *See table(s) above for measurements and observations.  Electronically signed by Curt Jews MD on 06/28/2018 at 5:34:43 PM.    Final       Medications:     Scheduled Medications: . aspirin EC  81 mg Oral Daily  . carvedilol  3.125 mg Oral BID WC  . clopidogrel  75 mg Oral Daily  . digoxin  0.125 mg Oral Daily  . furosemide  80 mg Intravenous BID  . levothyroxine  25 mcg Oral QAC breakfast  . ramipril  5 mg Oral Daily  . rosuvastatin  40 mg Oral Daily  . sodium chloride flush  3 mL Intravenous Q12H  . spironolactone  12.5 mg Oral QHS     Infusions: . sodium chloride    . sodium chloride 10 mL/hr at 06/29/18 0640  . heparin Stopped (06/29/18 0654)  PRN Medications:  sodium chloride, acetaminophen, ondansetron (ZOFRAN) IV, sodium chloride flush    Patient Profile   Andrea Hubbard is a 72 y.o. female with a history of tobacco use, HTN, hyperlipidemia, silent MI 2010 s/p PCI, carotid stenosis s/p right CEA 2010, PAD s/p stent to right common/external iliac artery and left common iliac artery in 0174, chronic systolic HF due to ICM (EF 30-35%), s/p [employer] ICD.  Admitted with worsening SOB and BLE edema.    Assessment/Plan   1. Acute systolic HF due to ICM. EF 06/22/18: EF 10-15% (drop from 30-35% 7 years ago), mod to severe AI, mild MR, LA mod dilated, RV function moderately reduced, severe TR, mod pulm regurg, PA peak pressure 61 mm Hg. Has [employer] ICD. - Volume optimized.  - Hold IV lasix until after cath today.  - Continue coreg 3.125 mg BID - Continue digoxin 0.125 mg daily - Continue ramipril 5 mg daily for now. I do not think BP would tolerate switch to Entresto yet. SBP 82-112 - Continue spiro 12.5 mg qHS. - R/LHC today.  2. CAD - Had a silent MI in 2010 with ?PCI (PCI per chart review, but pt denies ever having a cath) in [place] - Troponin 0.25 > 0.25 > 0.25. EKG unchanged.  - No s/s ischemia.  - LHC today. Continue heparin for now.  - Continue ASA and plavix. Continue crestor. Increased yesterday.  3. PAD/thigh pain - S/p stent to right common/external iliac artery and left common iliac artery in 2015. She has a known right SFA occlusion with three-vessel runoff. - ABI 02/2016: right ABI 0.69, patent iliac stents, left ABI 0.88 with patent iliac stents and moderate disease in the mid left SFA. - She is having a lot of bilateral anterior thigh pain at rest.  - ABIs 06/28/18 show bilateral moderate PAD (0.63-0.69) - Continue ASA, plavix  4. HTN - SBP 82-112  5. Hypothyroidism - TSH elevated 10.249, but T4 normal. - Continue synthroid. No change.   6. Tobacco use - Continues to smoke. Encouraged cessation. No change.  7. Hyperlipidemia - LDL 119 05/2018. Continue higher dose of Crestor. Previously refused referral to lipid clinic.   8. NSVT - K 3.9. Mag 1.6 yesterday. Supp. BMET pending this morning.  - Continue coreg 3.125 mg BID  9. Carotid stenosis s/p right CEA - Last carotis [place] 02/2016, which showed widely patent endarterectomy with moderate left ICA stenosis. Planned to follow annually.  - Carotid US 06/28/18: Right carotid 1-39% stenosis,  left carotid 40-59% stenosis  Medication concerns reviewed with patient and pharmacy team. Barriers identified: none at this time.   Length of Stay: Concepcion, NP  06/29/2018, 7:14 AM  Advanced Heart Failure Team Pager [phone] (M-F; 7a - 4p)  Please contact [facility] for night-coverage after hours (4p -7a ) and weekends on [email or web address]  Patient seen with NP, agree with the above note.    See cath report above for today.    1. Acute on chronic systolic CHF: Echo 91/63 with EF 10-15%, mild LV dilation, moderately decreased RV systolic function, severe TR, PASP 61 mmHg, moderate to severe AI. EF down compared to last echo which was about 7 years ago (30-35%).  LHC/RHC done today, ischemic cardiomyopathy with no interventional target or anatomy suitable to CABG.  Filling pressures optimized but low cardiac output.  ECG shows old inferior and anteroseptal MI.   - Stop IV Lasix, start torsemide 20 mg daily for  home.  - Continue beta blocker, use lower dose Coreg 3.125 mg bid.  - Continue digoxin 0.125 daily.   - Increase spironolactone to 25 mg daily.  - Continue ramipril 5 mg daily for now.  Eventually would aim to transition to Entresto (BP too soft currently). - With low output, she may be a candidate for LVAD in the future.  Will need to discuss.   2. CAD: Ischemic cardiomyopathy. She has ostially occluded RCA with collaterals from the left.  She has significant branch vessel disease in the LAD but no interventional target.  Anatomy is not suitable for CABG.  Medical management.  - Continue ASA and Plavix.  - Increased Crestor to 40 mg daily.  3. Active smoker: I strongly encouraged her to quit.  4. PAD: Patient reports pain in her thighs, not clearly exertional.  S/p stent to right common/external iliac artery and left common iliac artery in 2015. She has a known right SFA occlusion with three-vessel runoff.ABIs today with moderate PAD, in 0.6 range bilaterally.  She will need  followup with Dr. Gwenlyn Found at discharge.  5. Carotid artery disease: S/p right CEA.   - Carotid dopplers without severe disease this admission.   6. Hypothyroidism: Continue Synthroid.   7. NSVT: Would continue beta blocker, use Coreg 3.125 mg bid.   Loralie Champagne 06/29/2018 9:37 AM

## 2018-06-29 NOTE — Interval H&P Note (Signed)
History and Physical Interval Note:  06/29/2018 7:51 AM  Andrea Hubbard  has presented today for surgery, with the diagnosis of CHF  The various methods of treatment have been discussed with the patient and family. After consideration of risks, benefits and other options for treatment, the patient has consented to  Procedure(s): RIGHT/LEFT HEART CATH AND CORONARY ANGIOGRAPHY (N/A) as a surgical intervention .  The patient's history has been reviewed, patient examined, no change in status, stable for surgery.  I have reviewed the patient's chart and labs.  Questions were answered to the patient's satisfaction.     Nikolai Wilczak [employer]

## 2018-06-29 NOTE — Progress Notes (Signed)
Pt husband wanting to talk to MD Aundra Dubin MD at bedside now

## 2018-06-29 NOTE — Progress Notes (Signed)
Pt in cath lab

## 2018-06-29 NOTE — Progress Notes (Signed)
Educated pt on completion of contrast  Pt has completed 1 bottle at this time and 1/4 of the second  Called CT to inform due to shift change shortly   Micah in CT aware stated will schedule pt for 1945  Will inform night shift RN

## 2018-06-29 NOTE — Care Management Important Message (Signed)
Important Message  Patient Details  Name: Andrea Hubbard MRN: [id number] Date of Birth: 05-Oct-1945   Medicare Important Message Given:  Yes    Delorse Lek 06/29/2018, 4:49 PM

## 2018-06-29 NOTE — Progress Notes (Signed)
MCS EDUCATION NOTE:                VAD evaluation consent reviewed and signed by Andrea Hubbard and designated caregiver Andrea Hubbard.  Initial VAD teaching completed with pt and caregiver.   VAD educational packet including "HM III Patient Handbook", "HM III Left Ventricular Assist System" packet, and "[place] HM II Patient Education" reviewed in detail with me and left at bedside for continued reference.  All questions answered regarding VAD implant,  hospital stay, and what to expect when discharged home living with a heart pump. Pt identified husband as her primary caregiver if this therapy should be deemed appropriate for her.  Explained need for 24/7 care when pt is  discharged home due to sternal precautions, adaptation to living on support, emotional support, consistent and meticulous exit site care and management, medication adherence and high volume of follow up visits with the [facility] after discharge; both pt and caregiver verbalized understanding of above.   Explained that LVAD can be implanted for two indications in the setting of advanced left ventricular heart failure treatment:  1. Bridge to transplant - used for patients who cannot safely wait for heart transplant without this device.  Or    2. Destination therapy - used for patients until end of life or recovery of heart function.  Patient and caregiver(s) acknowledge that the indication at this point in time for LVAD therapy would be for DT due to age.   Provided brief equipment overview using pictures.   Identified the following lifestyle modifications while living on MCS:    1. No driving for at least three months and then only if doctor gives permission to do so.   2. No tub baths while pump implanted, and shower only when doctor gives permission.   3. No swimming or submersion in water while implanted with pump.   4. No contact sports or engaging in jumping activities.   5. Always have a backup controller, charged  spare batteries, and battery clips nearby at all times in case of emergency.   6. Call the doctor or hospital contact person if any change in how the pump sounds, feels, or works.   7. Plan to sleep only when connected to the power module.   8. Do not sleep on your stomach.   9. Keep a backup system controller, charged batteries, battery clips, and flashlight near you during sleep in case of electrical power outage.   10. Exit site care including dressing changes, monitoring for infection, and importance of keeping percutaneous lead stabilized at all times.     Extended the option to have one of our current patients and caregiver(s) come to talk with them about living on support to assist with decision making. They declined this offer at this time.  Reviewed pictures of VAD drive line, site care, dressing changes, and drive line stabilization including securement attachment device and abdominal binder. Discussed with pt and family that they will be required to purchase dressing supplies as long as patient has the VAD in place.   Reinforced need for 24 hour/7 day week caregivers. She will also need to abide by sternal precautions with no lifting >10lbs, pushing, pulling and will need assistance with adapting to new life style with VAD equipment and care.   Intermacs patient survival statistics through September 2019 reviewed with patient and caregiver as follows:  The patient understands that from this discussion it does not mean that they will receive the device, but that depends on an extensive evaluation process. The patient is aware of the fact that if at anytime they want to stop the evaluation process they can.  All questions have been answered at this time and contact information was provided should they encounter any further questions.  They are both agreeable at this time to the evaluation process and will move forward.   All orders placed  and surgeons aware.  Total Session Time: 60 minutes  Tanda Rockers, RN VAD Coordinator   Office: [phone] 24/7 VAD Pager: [phone]

## 2018-06-29 NOTE — Progress Notes (Signed)
Lower extremity venous duplex has been completed.   Preliminary results in CV Proc.   Abram Sander 06/29/2018 4:07 PM

## 2018-06-30 DIAGNOSIS — I5023 Acute on chronic systolic (congestive) heart failure: Secondary | ICD-10-CM

## 2018-06-30 LAB — HIV ANTIBODY (ROUTINE TESTING W REFLEX): HIV Screen 4th Generation wRfx: NONREACTIVE

## 2018-06-30 LAB — URIC ACID: Uric Acid, Serum: 7.9 mg/dL — ABNORMAL HIGH (ref 2.5–7.1)

## 2018-06-30 LAB — BASIC METABOLIC PANEL
Anion gap: 10 (ref 5–15)
BUN: 18 mg/dL (ref 8–23)
CO2: 25 mmol/L (ref 22–32)
Calcium: 8.3 mg/dL — ABNORMAL LOW (ref 8.9–10.3)
Chloride: 102 mmol/L (ref 98–111)
Creatinine, Ser: 0.93 mg/dL (ref 0.44–1.00)
GFR calc Af Amer: >60 mL/min (ref >60)
GFR calc non Af Amer: >60 mL/min (ref >60)
Glucose, Bld: 121 mg/dL — ABNORMAL HIGH (ref 70–99)
Potassium: 3.9 mmol/L (ref 3.5–5.1)
Sodium: 137 mmol/L (ref 135–145)

## 2018-06-30 LAB — TYPE AND SCREEN
ABO/RH(D): O POS
Antibody Screen: NEGATIVE

## 2018-06-30 LAB — ANTITHROMBIN III: AntiThromb III Func: 80 % (ref 75–120)

## 2018-06-30 LAB — HEMOGLOBIN A1C
Hgb A1c MFr Bld: 5.8 % — ABNORMAL HIGH (ref 4.8–5.6)
MEAN PLASMA GLUCOSE: 119.76 mg/dL

## 2018-06-30 LAB — PREALBUMIN: Prealbumin: 14.5 mg/dL — ABNORMAL LOW (ref 18–38)

## 2018-06-30 LAB — MAGNESIUM: Magnesium: 2.1 mg/dL (ref 1.7–2.4)

## 2018-06-30 LAB — ABO/RH: ABO/RH(D): O POS

## 2018-06-30 LAB — LACTATE DEHYDROGENASE: LDH: 183 U/L (ref 98–192)

## 2018-06-30 MED ORDER — DIGOXIN 125 MCG PO TABS: 0.1250 mg | ORAL_TABLET | Freq: Every day | ORAL | 3 refills | Status: DC

## 2018-06-30 MED ORDER — CARVEDILOL 3.125 MG PO TABS: 3.1250 mg | ORAL_TABLET | Freq: Two times a day (BID) | ORAL | 5 refills | Status: DC

## 2018-06-30 MED ORDER — SPIRONOLACTONE 25 MG PO TABS: 25.0000 mg | ORAL_TABLET | Freq: Every day | ORAL | 5 refills | Status: DC

## 2018-06-30 MED ORDER — POTASSIUM CHLORIDE CRYS ER 20 MEQ PO TBCR: 20.0000 meq | EXTENDED_RELEASE_TABLET | Freq: Every day | ORAL | 5 refills | Status: DC

## 2018-06-30 MED ORDER — ROSUVASTATIN CALCIUM 40 MG PO TABS: 40.0000 mg | ORAL_TABLET | Freq: Every day | ORAL | 3 refills | Status: DC

## 2018-06-30 MED ORDER — TORSEMIDE 20 MG PO TABS: 20.0000 mg | ORAL_TABLET | Freq: Every day | ORAL | 5 refills | Status: DC

## 2018-06-30 MED ORDER — CARVEDILOL 3.125 MG PO TABS
3.1250 mg | ORAL_TABLET | Freq: Two times a day (BID) | ORAL | Status: DC
  Administered 2018-06-30: 3.125 mg via ORAL
  Filled 2018-06-30: qty 1

## 2018-06-30 MED ORDER — ACETAMINOPHEN 325 MG PO TABS: 650.0000 mg | ORAL_TABLET | ORAL | Status: AC | PRN

## 2018-06-30 MED ORDER — GUAIFENESIN ER 600 MG PO TB12
600.0000 mg | ORAL_TABLET | Freq: Once | ORAL | Status: AC
  Administered 2018-06-30: 600 mg via ORAL
  Filled 2018-06-30: qty 1

## 2018-06-30 MED ORDER — POTASSIUM CHLORIDE CRYS ER 20 MEQ PO TBCR
20.0000 meq | EXTENDED_RELEASE_TABLET | Freq: Every day | ORAL | Status: DC
  Administered 2018-06-30: 20 meq via ORAL
  Filled 2018-06-30: qty 1

## 2018-06-30 NOTE — Discharge Instructions (Signed)

## 2018-06-30 NOTE — Progress Notes (Signed)
Pt IV and telemetry removed. Pt discharge education went over at bedside with [facility] RN. Pt discharged via wheelchair with staff. Pt has all belongings.

## 2018-06-30 NOTE — Progress Notes (Addendum)
Patient ID: Andrea Hubbard, female   DOB: 10/15/1945, 73 y.o.   MRN: [id number]     Advanced Heart Failure Rounding Note  PCP-Cardiologist: No primary care provider on file.   Subjective:    She is now on po torsemide, weight down another 2 lbs. No dyspnea.  Has some upper respiratory congestion.  No chest pain.   RHC/LHC (06/29/18):  Coronary Findings   Diagnostic  Dominance: Right  Left Anterior Descending  40% proximal LAD stenosis. Luminal irregularities throughout the rest of the LAD. Small to moderate D1 with 95% ostial stenosis. Small to moderate D2 with 70% ostial stenosis. Small D3 with 99% proximal stenosis.  Left Circumflex  60-70% ostial stenosis in small to moderate OM1. Patent stent proximally in large OM2. Luminal irregularities throughout the LCx.  Right Coronary Artery  Occluded at the ostium with left to right collaterals.  Intervention   No interventions have been documented.  Right Heart   Right Heart Pressures RHC Procedural Findings: Hemodynamics (mmHg) RA mean 6 RV 45/8 PA 40/16, mean 25 PCWP mean 12 LV 128/16 AO 131/52  Oxygen saturations: PA 59% AO 99%  Cardiac Output (Fick) 2.8  Cardiac Index (Fick) 1.7 PVR 4.6 WU     Carotid US 06/28/18: Right Carotid: Velocities in the right ICA are consistent with a 1-39% stenosis. Left Carotid: Velocities in the left ICA are consistent with a 40-59% stenosis. Vertebrals: Bilateral vertebral arteries demonstrate antegrade flow.  ABIs 06/28/18: Right: Resting right ankle-brachial index indicates moderate right lower extremity arterial disease. (0.63) The right toe-brachial index is abnormal. Left: Resting left ankle-brachial index indicates moderate left lower extremity arterial disease. (0.69) The left toe-brachial index is abnormal.  Objective:   Weight Range: 57.6 kg Body mass index is 20.48 kg/m.   Vital Signs:   Temp:  [98.1 F (36.7 C)-98.5 F (36.9 C)] 98.2 F (36.8 C) (01/04 0456) Pulse  Rate:  [62-70] 70 (01/04 0920) Resp:  [18] 18 (01/04 0456) BP: (94-133)/(51-71) 109/69 (01/04 0920) SpO2:  [97 %-100 %] 97 % (01/04 0456) Weight:  [57.6 kg] 57.6 kg (01/04 0456) Last BM Date: 06/29/17  Weight change: Filed Weights   06/28/18 0129 06/29/18 0105 06/30/18 0456  Weight: 61 kg 58.3 kg 57.6 kg    Intake/Output:   Intake/Output Summary (Last 24 hours) at 06/30/2018 1127 Last data filed at 06/30/2018 0900 Gross per 24 hour  Intake 820 ml  Output 1575 ml  Net -755 ml      Physical Exam    General: NAD Neck: No JVD, no thyromegaly or thyroid nodule.  Lungs: Clear to auscultation bilaterally with normal respiratory effort. CV: Nondisplaced PMI.  Heart regular S1/S2, no S3/S4, no murmur.  No peripheral edema.  No carotid bruit.  Normal pedal pulses.  Abdomen: Soft, nontender, no hepatosplenomegaly, no distention.  Skin: Intact without lesions or rashes.  Neurologic: Alert and oriented x 3.  Psych: Normal affect. Extremities: No clubbing or cyanosis.  HEENT: Normal.   Telemetry   NSR 70s, no arrhythmias. Personally reviewed.   EKG    No new tracings.   Labs    CBC Recent Labs    06/28/18 0414 06/29/18 0531  WBC 8.3 8.6  HGB 14.8 14.6  HCT 45.7 45.6  MCV 84.9 83.4  PLT 196 494   Basic Metabolic Panel Recent Labs    06/29/18 0531 06/30/18 0437  NA 137 137  K 3.1* 3.9  CL 104 102  CO2 23 25  GLUCOSE 94 121*  BUN  16 18  CREATININE 0.98 0.93  CALCIUM 8.3* 8.3*  MG 1.5* 2.1   Liver Function Tests No results for input(s): AST, ALT, ALKPHOS, BILITOT, PROT, ALBUMIN in the last 72 hours. No results for input(s): LIPASE, AMYLASE in the last 72 hours. Cardiac Enzymes Recent Labs    06/27/18 2305 06/28/18 0414 06/28/18 0946  TROPONINI 0.25* 0.25* 0.25*    BNP: BNP (last 3 results) Recent Labs    06/27/18 1730  BNP 954.2*    ProBNP (last 3 results) No results for input(s): PROBNP in the last 8760 hours.   D-Dimer Recent Labs     06/27/18 1730  DDIMER 1.52*   Hemoglobin A1C Recent Labs    06/30/18 0437  HGBA1C 5.8*   Fasting Lipid Panel No results for input(s): CHOL, HDL, LDLCALC, TRIG, CHOLHDL, LDLDIRECT in the last 72 hours. Thyroid Function Tests Recent Labs    06/27/18 2305  TSH 10.249*    Other results:   Imaging    Ct Abdomen Pelvis Wo Contrast  Result Date: 06/29/2018 CLINICAL DATA:  Assess for ventricular assist device candidacy EXAM: CT CHEST, ABDOMEN AND PELVIS WITHOUT CONTRAST TECHNIQUE: Multidetector CT imaging of the chest, abdomen and pelvis was performed following the standard protocol without IV contrast. COMPARISON:  CT chest 06/27/2018 FINDINGS: CT CHEST FINDINGS Cardiovascular: Cardiomegaly with multi chamber enlargement. Pacemaker/AICD inserted from a left subclavian approach with its tip in the region of the right ventricle. Small amount of pericardial fluid. Extensive coronary artery calcification. Aortic atherosclerosis without aneurysm. Pulmonary arteries are not enlarged. Mediastinum/Nodes: No mass or lymphadenopathy. Lungs/Pleura: Background emphysema pattern without dominant bullous disease. No evidence of infiltrate or collapse. No mass or nodule in need of follow-up. No pleural fluid. Musculoskeletal: Minor inferior endplate fracture at H88. CT ABDOMEN PELVIS FINDINGS Hepatobiliary: Liver appears normal without contrast. No calcified gallstones. Pancreas: Normal Spleen: Normal Adrenals/Urinary Tract: Adrenal glands are normal. 1.5 cm cyst at the lower pole of the left kidney. Small amount of residual contrast in both renal collecting systems. 1.5 cm cyst in the right renal hilar region. Stomach/Bowel: No primary bowel pathology or significant finding. Vascular/Lymphatic: Aortic atherosclerosis. Bilateral iliac region stents. IVC is normal. No retroperitoneal adenopathy. Reproductive: No pelvic mass. Other: No free fluid or air. Musculoskeletal: Ordinary lumbar degenerative changes.  IMPRESSION: Cardiac enlargement with multi chamber enlargement. Extensive coronary artery calcification. Aortic atherosclerosis. Background emphysema without dominant bullous disease. No active pulmonary finding. No active abdominal or pelvic finding or obvious noncardiac contraindication for ventricular assist device. Aortic Atherosclerosis (ICD10-I70.0) and Emphysema (ICD10-J43.9). Electronically Signed   By: Nelson Chimes M.D.   On: 06/29/2018 20:17   Ct Chest Wo Contrast  Result Date: 06/29/2018 CLINICAL DATA:  Assess for ventricular assist device candidacy EXAM: CT CHEST, ABDOMEN AND PELVIS WITHOUT CONTRAST TECHNIQUE: Multidetector CT imaging of the chest, abdomen and pelvis was performed following the standard protocol without IV contrast. COMPARISON:  CT chest 06/27/2018 FINDINGS: CT CHEST FINDINGS Cardiovascular: Cardiomegaly with multi chamber enlargement. Pacemaker/AICD inserted from a left subclavian approach with its tip in the region of the right ventricle. Small amount of pericardial fluid. Extensive coronary artery calcification. Aortic atherosclerosis without aneurysm. Pulmonary arteries are not enlarged. Mediastinum/Nodes: No mass or lymphadenopathy. Lungs/Pleura: Background emphysema pattern without dominant bullous disease. No evidence of infiltrate or collapse. No mass or nodule in need of follow-up. No pleural fluid. Musculoskeletal: Minor inferior endplate fracture at F02. CT ABDOMEN PELVIS FINDINGS Hepatobiliary: Liver appears normal without contrast. No calcified gallstones. Pancreas: Normal Spleen: Normal Adrenals/Urinary  Tract: Adrenal glands are normal. 1.5 cm cyst at the lower pole of the left kidney. Small amount of residual contrast in both renal collecting systems. 1.5 cm cyst in the right renal hilar region. Stomach/Bowel: No primary bowel pathology or significant finding. Vascular/Lymphatic: Aortic atherosclerosis. Bilateral iliac region stents. IVC is normal. No retroperitoneal  adenopathy. Reproductive: No pelvic mass. Other: No free fluid or air. Musculoskeletal: Ordinary lumbar degenerative changes. IMPRESSION: Cardiac enlargement with multi chamber enlargement. Extensive coronary artery calcification. Aortic atherosclerosis. Background emphysema without dominant bullous disease. No active pulmonary finding. No active abdominal or pelvic finding or obvious noncardiac contraindication for ventricular assist device. Aortic Atherosclerosis (ICD10-I70.0) and Emphysema (ICD10-J43.9). Electronically Signed   By: Nelson Chimes M.D.   On: 06/29/2018 20:17   Vas [place] Lower Extremity Venous (dvt)  Result Date: 06/29/2018  Lower Venous Study Indications: Pvd.  Performing Technologist: Abram Sander RVS  Examination Guidelines: A complete evaluation includes B-mode imaging, spectral Doppler, color Doppler, and power Doppler as needed of all accessible portions of each vessel. Bilateral testing is considered an integral part of a complete examination. Limited examinations for reoccurring indications may be performed as noted.  Right Venous Findings: +---------+---------------+---------+-----------+----------+-------+          CompressibilityPhasicitySpontaneityPropertiesSummary +---------+---------------+---------+-----------+----------+-------+ CFV      Full           Yes      Yes                          +---------+---------------+---------+-----------+----------+-------+ SFJ      Full                                                 +---------+---------------+---------+-----------+----------+-------+ FV Prox  Full                                                 +---------+---------------+---------+-----------+----------+-------+ FV Mid   Full                                                 +---------+---------------+---------+-----------+----------+-------+ FV DistalFull                                                  +---------+---------------+---------+-----------+----------+-------+ PFV      Full                                                 +---------+---------------+---------+-----------+----------+-------+ POP      Full           Yes      Yes                          +---------+---------------+---------+-----------+----------+-------+ PTV      Full                                                 +---------+---------------+---------+-----------+----------+-------+  PERO     Full                                                 +---------+---------------+---------+-----------+----------+-------+  Left Venous Findings: +---------+---------------+---------+-----------+----------+-------+          CompressibilityPhasicitySpontaneityPropertiesSummary +---------+---------------+---------+-----------+----------+-------+ CFV      Full           Yes      Yes                          +---------+---------------+---------+-----------+----------+-------+ SFJ      Full                                                 +---------+---------------+---------+-----------+----------+-------+ FV Prox  Full                                                 +---------+---------------+---------+-----------+----------+-------+ FV Mid   Full                                                 +---------+---------------+---------+-----------+----------+-------+ FV DistalFull                                                 +---------+---------------+---------+-----------+----------+-------+ PFV      Full                                                 +---------+---------------+---------+-----------+----------+-------+ POP      Full           Yes      Yes                          +---------+---------------+---------+-----------+----------+-------+ PTV      Full                                                  +---------+---------------+---------+-----------+----------+-------+ PERO     Full                                                 +---------+---------------+---------+-----------+----------+-------+    Summary: Right: There is no evidence of deep vein thrombosis in the lower extremity. No cystic structure found in the popliteal fossa. Left: There is no evidence of deep vein thrombosis in the lower extremity. No cystic structure found in the popliteal fossa.  *  See table(s) above for measurements and observations.    Preliminary      Medications:     Scheduled Medications: . aspirin EC  81 mg Oral Daily  . carvedilol  3.125 mg Oral BID WC  . clopidogrel  75 mg Oral Daily  . digoxin  0.125 mg Oral Daily  . heparin  5,000 Units Subcutaneous Q8H  . levothyroxine  25 mcg Oral QAC breakfast  . ramipril  5 mg Oral Daily  . rosuvastatin  40 mg Oral Daily  . sodium chloride flush  3 mL Intravenous Q12H  . spironolactone  25 mg Oral QHS  . torsemide  20 mg Oral Daily    Infusions: . sodium chloride      PRN Medications: sodium chloride, acetaminophen, ondansetron (ZOFRAN) IV, sodium chloride flush    Patient Profile   Carolee Channell is a 73 y.o. female with a history of tobacco use, HTN, hyperlipidemia, silent MI 2010 s/p PCI, carotid stenosis s/p right CEA 2010, PAD s/p stent to right common/external iliac artery and left common iliac artery in 4580, chronic systolic HF due to ICM (EF 30-35%), s/p [employer] ICD.  Admitted with worsening SOB and BLE edema.   Assessment/Plan   1. Acute on chronic systolic CHF: Echo 99/83 with EF 10-15%, mild LV dilation, moderately decreased RV systolic function, severe TR, PASP 61 mmHg, moderate to severe AI. EF down compared to last echo which was about 7 years ago (30-35%).  LHC/RHC done 1/3, ischemic cardiomyopathy with no interventional target or anatomy suitable to CABG.  Filling pressures optimized but low cardiac output.  Volume  status ok on exam today, no chest pain.  - Continue torsemide 20 mg daily for home.  - Continue beta blocker, use lower dose Coreg 3.125 mg bid.  - Continue digoxin 0.125 daily.   - Continue spironolactone 25 mg daily.  - Continue ramipril 5 mg daily for now.  Eventually would aim to transition to [facility] (will do as outpatient given other med changes). - With low output, she may be a candidate for LVAD in the future.  She has met with our LVAD nurse navigator.  Will continue close followup/evaluation.  - Repeat echo at 3 months.  If she will not be going for LVAD, she will need ICD.  She is not a candidate for CRT with narrow QRS.  2. CAD: Ischemic cardiomyopathy. She has ostially occluded RCA with collaterals from the left.  She has significant branch vessel disease in the LAD but no interventional target.  Anatomy is not suitable for CABG.  Medical management. No chest pain.  - Continue ASA and Plavix.  - Increased Crestor to 40 mg daily.  3. Active smoker: I strongly encouraged her to quit.  4. PAD: Patient reports pain in her thighs, not clearly exertional.  S/p stent to right common/external iliac artery and left common iliac artery in 2015. She has a known right SFA occlusion with three-vessel runoff.ABIs today with moderate PAD, in 0.6 range bilaterally.  She will need followup with Dr. Gwenlyn Found at discharge.  5. Carotid artery disease: S/p right CEA.   - Carotid dopplers without severe disease this admission.   6. Hypothyroidism: Continue Synthroid.   7. NSVT: 1 short episode without recurrence.  She is now on Coreg.  8. Disposition: Home today.  Needs followup with me in CHF clinic in 10 days.  Needs appt with Dr. Gwenlyn Found for PAD followup.  Meds for home: ASA 81 daily, spironolactone 25 daily,  Plavix 75 daily, Crestor 40 daily, ramipril 5 daily, Coreg 3.125 mg bid, digoxin 0.125 daily, torsemide 20 daily, KCl 20 daily.   Loralie Champagne 06/30/2018 11:27 AM

## 2018-06-30 NOTE — Discharge Summary (Signed)
Discharge Summary    Patient ID: Andrea Hubbard MRN: [id number]; DOB: 11/29/1945  Admit date: 06/27/2018 Discharge date: 06/30/2018  Primary Care Provider: Glendale Chard, MD  Primary Cardiologist: Dr Gwenlyn Found Dr Aundra Dubin CHF Primary Electrophysiologist:  Dr [name]  Discharge Diagnoses    1. Acute on chronic systolic CHF: Echo 28/76 with EF 10-15%-ischemic cardiomyopathy, mild LV dilation, moderately decreased RV systolic function, severe TR, PASP 61 mmHg, moderate to severe AI. EF down compared to last echo which was about 7 years ago (30-35%). LHC/RHC done 1/3, ischemic cardiomyopathy with no interventional target or anatomy suitable to CABG.  Filling pressures optimized but low cardiac output.   2. CAD:  She has ostially occluded RCA with collaterals from the left.  She has significant branch vessel disease in the LAD but no interventional target.  Anatomy is not suitable for CABG.  Medical management.    3. PAD: S/p stent to right common/external iliac artery and left common iliac artery in 2015.She has a known right SFA occlusion with three-vessel runoff. ABIs suggest moderate PAD, in 0.6 range bilaterally.     4. Hypothyroidism: TSH 10-Continue Synthroid.   5. NSVT: 1 short episode without recurrence.  She is now on Coreg.   6. Smoker: strongly encouraged to quit  Allergies Allergies  Allergen Reactions  . Morphine And Related Other (See Comments) and Hypertension    Raises blood pressure, makes the patient "hyper"    Diagnostic Studies/Procedures    LE venous dopplers 06/29/18 Rt and Lt heart cath 06/29/18 Carotid doppler 06/28/18 LEA doppler 06/28/18  _____________   History of Present Illness     73 year old female with known coronary disease and vascular disease, admitted with congestive heart failure June 27, 2018.  Hospital Course     The patient is a 73 y.o. old thin appearing married [place] American female, recently moved from [place] to retire in [place]. 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  The patient is seen Dr. Alvester Chou in the office as an outpatient in the past.  She has a history of coronary disease with remote MI and cardiomyopathy.  This was in [place].  She has an ICD in place.  She also has vascular disease with prior right carotid endarterectomy.  She also has lower extremity arterial disease.  Dr Gwenlyn Found performed an intervention on her left common iliac and right common iliac and external arteries in 2015 as well as an angioplasty to her mid left SFA.  Dr. Alvester Chou saw her in the office on 06/21/2018, the patient had been complaining of increasing dyspnea.  An echocardiogram was obtained and revealed an ejection fraction of 10 to 15%.  Previous echo had shown her EF to be 30 to 35%.  The patient presented to [facility] emergency room June 27, 2018 with congestive heart failure.  Her BNP was 954.  She had been having shortness of breath and lower extremity edema and increasing fatigue.  She was started on heparin for an elevated troponin of 0.25.  She did have 27 beats of nonsustained V. tach in the emergency room.  Patient was diuresed and set up for a right and left heart cath which was done 06/29/2018.  This revealed 40% proximal LAD as well as a 95% first diagonal and a 70% second diagonal.  There was a 60 to 70% ostial stenosis in the OM1 and a patent stent and a large OM 2.  The RCA was occluded with left-to-right collaterals.  The plan is for medical therapy.  Filling pressures were noted to be optimized but there was a low cardiac output.  Dr. [name] followed the patient during this hospitalization.  Is recommended medical therapy.  The plan is for a follow-up echo in 3 months.  Depending on her ejection fraction she may or may not be a candidate for an LVAD device.  She has a narrow complex QRS so would not be a candidate for a Bi V device.  Dr. [name] saw the patient on the morning of January 4 and feel she can be  discharged.  She has had some leg pain and will need follow-up with Dr. Alvester Chou in the office for her peripheral vascular disease.  Outpatient follow-up with Dr. Gwenlyn Found and Dr. Marigene Ehlers will be arranged. _____________  Discharge Vitals Blood pressure 109/69, pulse 70, temperature 98.2 F (36.8 C), temperature source Oral, resp. rate 18, height 5\' 6"  (1.676 m), weight 57.6 kg, SpO2 97 %.  Filed Weights   06/28/18 0129 06/29/18 0105 06/30/18 0456  Weight: 61 kg 58.3 kg 57.6 kg    Labs & Radiologic Studies    CBC Recent Labs    06/28/18 0414 06/29/18 0531  WBC 8.3 8.6  HGB 14.8 14.6  HCT 45.7 45.6  MCV 84.9 83.4  PLT 196 825   Basic Metabolic Panel Recent Labs    06/29/18 0531 06/30/18 0437  NA 137 137  K 3.1* 3.9  CL 104 102  CO2 23 25  GLUCOSE 94 121*  BUN 16 18  CREATININE 0.98 0.93  CALCIUM 8.3* 8.3*  MG 1.5* 2.1   Liver Function Tests No results for input(s): AST, ALT, ALKPHOS, BILITOT, PROT, ALBUMIN in the last 72 hours. No results for input(s): LIPASE, AMYLASE in the last 72 hours. Cardiac Enzymes Recent Labs    06/27/18 2305 06/28/18 0414 06/28/18 0946  TROPONINI 0.25* 0.25* 0.25*   BNP Invalid input(s): POCBNP D-Dimer Recent Labs    06/27/18 1730  DDIMER 1.52*   Hemoglobin A1C Recent Labs    06/30/18 0437  HGBA1C 5.8*   Fasting Lipid Panel No results for input(s): CHOL, HDL, LDLCALC, TRIG, CHOLHDL, LDLDIRECT in the last 72 hours. Thyroid Function Tests Recent Labs    06/27/18 2305  TSH 10.249*   _____________  Ct Abdomen Pelvis Wo Contrast  Result Date: 06/29/2018 CLINICAL DATA:  Assess for ventricular assist device candidacy EXAM: CT CHEST, ABDOMEN AND PELVIS WITHOUT CONTRAST TECHNIQUE: Multidetector CT imaging of the chest, abdomen and pelvis was performed following the standard protocol without IV contrast. COMPARISON:  CT chest 06/27/2018 FINDINGS: CT CHEST FINDINGS Cardiovascular: Cardiomegaly with multi chamber enlargement.  Pacemaker/AICD inserted from a left subclavian approach with its tip in the region of the right ventricle. Small amount of pericardial fluid. Extensive coronary artery calcification. Aortic atherosclerosis without aneurysm. Pulmonary arteries are not enlarged. Mediastinum/Nodes: No mass or lymphadenopathy. Lungs/Pleura: Background emphysema pattern without dominant bullous disease. No evidence of infiltrate or collapse. No mass or nodule in need of follow-up. No pleural fluid. Musculoskeletal: Minor inferior endplate fracture at K53. CT ABDOMEN PELVIS FINDINGS Hepatobiliary: Liver appears normal without contrast. No calcified gallstones. Pancreas: Normal Spleen: Normal Adrenals/Urinary Tract: Adrenal glands are normal. 1.5 cm cyst at the lower pole of the left kidney. Small amount of residual contrast in both renal collecting systems. 1.5 cm cyst in the right renal hilar region. Stomach/Bowel: No primary bowel pathology or significant finding. Vascular/Lymphatic: Aortic atherosclerosis. Bilateral iliac region stents. IVC is normal. No retroperitoneal adenopathy. Reproductive:  No pelvic mass. Other: No free fluid or air. Musculoskeletal: Ordinary lumbar degenerative changes. IMPRESSION: Cardiac enlargement with multi chamber enlargement. Extensive coronary artery calcification. Aortic atherosclerosis. Background emphysema without dominant bullous disease. No active pulmonary finding. No active abdominal or pelvic finding or obvious noncardiac contraindication for ventricular assist device. Aortic Atherosclerosis (ICD10-I70.0) and Emphysema (ICD10-J43.9). Electronically Signed   By: Nelson Chimes M.D.   On: 06/29/2018 20:17   Dg Chest 2 View  Result Date: 06/27/2018 CLINICAL DATA:  Swollen extremities, short of breath EXAM: CHEST - 2 VIEW COMPARISON:  CT 06/27/2018, radiograph 12/23/2013 FINDINGS: Left-sided pacing device as before. Interval moderate cardiomegaly without edema. No pleural effusion or focal airspace  disease. Aortic atherosclerosis. IMPRESSION: Interval moderate cardiomegaly without edema or infiltrate Electronically Signed   By: Donavan Foil M.D.   On: 06/27/2018 19:52   Ct Chest Wo Contrast  Result Date: 06/29/2018 CLINICAL DATA:  Assess for ventricular assist device candidacy EXAM: CT CHEST, ABDOMEN AND PELVIS WITHOUT CONTRAST TECHNIQUE: Multidetector CT imaging of the chest, abdomen and pelvis was performed following the standard protocol without IV contrast. COMPARISON:  CT chest 06/27/2018 FINDINGS: CT CHEST FINDINGS Cardiovascular: Cardiomegaly with multi chamber enlargement. Pacemaker/AICD inserted from a left subclavian approach with its tip in the region of the right ventricle. Small amount of pericardial fluid. Extensive coronary artery calcification. Aortic atherosclerosis without aneurysm. Pulmonary arteries are not enlarged. Mediastinum/Nodes: No mass or lymphadenopathy. Lungs/Pleura: Background emphysema pattern without dominant bullous disease. No evidence of infiltrate or collapse. No mass or nodule in need of follow-up. No pleural fluid. Musculoskeletal: Minor inferior endplate fracture at S34. CT ABDOMEN PELVIS FINDINGS Hepatobiliary: Liver appears normal without contrast. No calcified gallstones. Pancreas: Normal Spleen: Normal Adrenals/Urinary Tract: Adrenal glands are normal. 1.5 cm cyst at the lower pole of the left kidney. Small amount of residual contrast in both renal collecting systems. 1.5 cm cyst in the right renal hilar region. Stomach/Bowel: No primary bowel pathology or significant finding. Vascular/Lymphatic: Aortic atherosclerosis. Bilateral iliac region stents. IVC is normal. No retroperitoneal adenopathy. Reproductive: No pelvic mass. Other: No free fluid or air. Musculoskeletal: Ordinary lumbar degenerative changes. IMPRESSION: Cardiac enlargement with multi chamber enlargement. Extensive coronary artery calcification. Aortic atherosclerosis. Background emphysema without  dominant bullous disease. No active pulmonary finding. No active abdominal or pelvic finding or obvious noncardiac contraindication for ventricular assist device. Aortic Atherosclerosis (ICD10-I70.0) and Emphysema (ICD10-J43.9). Electronically Signed   By: Nelson Chimes M.D.   On: 06/29/2018 20:17   Ct Angio Chest Pe W And/or Wo Contrast  Result Date: 06/27/2018 CLINICAL DATA:  Bilateral leg swelling EXAM: CT ANGIOGRAPHY CHEST WITH CONTRAST TECHNIQUE: Multidetector CT imaging of the chest was performed using the standard protocol during bolus administration of intravenous contrast. Multiplanar CT image reconstructions and MIPs were obtained to evaluate the vascular anatomy. CONTRAST:  49mL ISOVUE-370 IOPAMIDOL (ISOVUE-370) INJECTION 76% COMPARISON:  Chest x-ray 12/23/2013 FINDINGS: Cardiovascular: Satisfactory opacification of the pulmonary arteries to the segmental level. No evidence of pulmonary embolism. Nonaneurysmal aorta. Moderate aortic atherosclerosis. Coronary vascular calcification. Moderate cardiomegaly with multi chamber enlargement. No pericardial effusion. Reflux of contrast into the hepatic veins suggesting elevated right heart pressure. Mediastinum/Nodes: Midline trachea. No thyroid mass. No significantly enlarged lymph nodes. Esophagus within normal limits. Lungs/Pleura: No acute consolidation or effusion.  No pneumothorax. Upper Abdomen: No acute abnormality. Musculoskeletal: No acute or suspicious abnormality Review of the MIP images confirms the above findings. IMPRESSION: 1. Negative for acute pulmonary embolus 2. Moderate cardiomegaly with multi chamber enlargement. Reflux of  contrast into the attic veins suggesting elevated right heart pressures. Aortic Atherosclerosis (ICD10-I70.0). Electronically Signed   By: Donavan Foil M.D.   On: 06/27/2018 19:47   Vas [place] Burnard Bunting With/wo Tbi  Result Date: 06/28/2018 LOWER EXTREMITY DOPPLER STUDY Indications: Peripheral artery disease. High Risk Factors:  Current smoker.  Comparison Study: ABI/TBI on 03/16/2016, data is not available Performing Technologist: COLE, Hongying RDMS, RVT  Examination Guidelines: A complete evaluation includes at minimum, Doppler waveform signals and systolic blood pressure reading at the level of bilateral brachial, anterior tibial, and posterior tibial arteries, when vessel segments are accessible. Bilateral testing is considered an integral part of a complete examination. Photoelectric Plethysmograph (PPG) waveforms and toe systolic pressure readings are included as required and additional duplex testing as needed. Limited examinations for reoccurring indications may be performed as noted.  ABI Findings: +---------+------------------+-----+-------------------+--------+ Right    Rt Pressure (mmHg)IndexWaveform           Comment  +---------+------------------+-----+-------------------+--------+ Brachial 135                    triphasic                   +---------+------------------+-----+-------------------+--------+ PTA      85                0.63 monophasic                  +---------+------------------+-----+-------------------+--------+ DP       80                0.59 dampened monophasic         +---------+------------------+-----+-------------------+--------+ Great Toe62                0.46                             +---------+------------------+-----+-------------------+--------+ +---------+------------------+-----+-------------------+-------+ Left     Lt Pressure (mmHg)IndexWaveform           Comment +---------+------------------+-----+-------------------+-------+ Brachial 121                    biphasic                   +---------+------------------+-----+-------------------+-------+ PTA      93                0.69 monophasic                 +---------+------------------+-----+-------------------+-------+ DP       83                0.61 dampened monophasic         +---------+------------------+-----+-------------------+-------+ Great Toe58                0.43                            +---------+------------------+-----+-------------------+-------+ +-------+-----------+-----------+------------+------------+ ABI/TBIToday's ABIToday's TBIPrevious ABIPrevious TBI +-------+-----------+-----------+------------+------------+ Right  0.63       0.46                                +-------+-----------+-----------+------------+------------+ Left   0.69       0.43                                +-------+-----------+-----------+------------+------------+  Summary: Right: Resting right ankle-brachial index indicates moderate right lower extremity arterial disease. The right toe-brachial index is abnormal. Left: Resting left ankle-brachial index indicates moderate left lower extremity arterial disease. The left toe-brachial index is abnormal.  *See table(s) above for measurements and observations.  Electronically signed by Curt Jews MD on 06/28/2018 at 5:35:18 PM.    Final    Vas US Carotid  Result Date: 06/28/2018 Carotid Arterial Duplex Study Indications:       Carotid stenosis. Right Endarterectomy on 2010. Risk Factors:      Current smoker. Comparison Study:  Carotid duplex exam on 2017 Performing Technologist: Rudell Cobb Supporting Technologist: Oda Cogan RDMS, RVT  Examination Guidelines: A complete evaluation includes B-mode imaging, spectral Doppler, color Doppler, and power Doppler as needed of all accessible portions of each vessel. Bilateral testing is considered an integral part of a complete examination. Limited examinations for reoccurring indications may be performed as noted.  Right Carotid Findings: +----------+--------+--------+--------+------------------+---------------------+           PSV cm/sEDV cm/sStenosisDescribe          Comments              +----------+--------+--------+--------+------------------+---------------------+  CCA Prox  89      15              diffuse,          intimal irregular                                       heterogenous and  thickening vs                                           irregular         irregular plaques     +----------+--------+--------+--------+------------------+---------------------+ CCA Mid                                             intimal questionable                                                      tearing?              +----------+--------+--------+--------+------------------+---------------------+ CCA Distal119     21              heterogenous                            +----------+--------+--------+--------+------------------+---------------------+ ICA Prox  131     23      1-39%   heterogenous      post endarterectomy   +----------+--------+--------+--------+------------------+---------------------+ ICA Mid   74      16                                                      +----------+--------+--------+--------+------------------+---------------------+ ICA Distal108  29                                                      +----------+--------+--------+--------+------------------+---------------------+ ECA       103                                                             +----------+--------+--------+--------+------------------+---------------------+ +----------+--------+-------+--------+-------------------+           PSV cm/sEDV cmsDescribeArm Pressure (mmHG) +----------+--------+-------+--------+-------------------+ [id number]                                        +----------+--------+-------+--------+-------------------+ +---------+--------+--+--------+--+---------+ VertebralPSV cm/s65EDV cm/s16Antegrade +---------+--------+--+--------+--+---------+  Left Carotid Findings: +----------+--------+--------+--------+-------------------+--------------------+           PSV cm/sEDV  cm/sStenosisDescribe           Comments             +----------+--------+--------+--------+-------------------+--------------------+ CCA Prox  66      14                                 intimal thickening                                                        with irregular                                                            plaque debris        +----------+--------+--------+--------+-------------------+--------------------+ CCA Mid   59      12                                 irregular intimal                                                         thickening           +----------+--------+--------+--------+-------------------+--------------------+ CCA Distal58      12                                 intimal irregular    +----------+--------+--------+--------+-------------------+--------------------+ ICA Prox  189     42      40-59%  calcific and  irregular                               +----------+--------+--------+--------+-------------------+--------------------+ ICA Mid   97      17              calcific                                +----------+--------+--------+--------+-------------------+--------------------+ ICA Distal81      18                                                      +----------+--------+--------+--------+-------------------+--------------------+ ECA       342             >50%                                            +----------+--------+--------+--------+-------------------+--------------------+ +----------+--------+--------+--------+-------------------+ SubclavianPSV cm/sEDV cm/sDescribeArm Pressure (mmHG) +----------+--------+--------+--------+-------------------+           165                                         +----------+--------+--------+--------+-------------------+ +---------+--------+--+--------+--+---------+  VertebralPSV cm/s28EDV cm/s13Antegrade +---------+--------+--+--------+--+---------+  Summary: Right Carotid: Velocities in the right ICA are consistent with a 1-39% stenosis. Left Carotid: Velocities in the left ICA are consistent with a 40-59% stenosis. Vertebrals: Bilateral vertebral arteries demonstrate antegrade flow. *See table(s) above for measurements and observations.  Electronically signed by Curt Jews MD on 06/28/2018 at 5:34:43 PM.    Final    Vas [place] Lower Extremity Venous (dvt)  Result Date: 06/29/2018  Lower Venous Study Indications: Pvd.  Performing Technologist: Abram Sander RVS  Examination Guidelines: A complete evaluation includes B-mode imaging, spectral Doppler, color Doppler, and power Doppler as needed of all accessible portions of each vessel. Bilateral testing is considered an integral part of a complete examination. Limited examinations for reoccurring indications may be performed as noted.  Right Venous Findings: +---------+---------------+---------+-----------+----------+-------+          CompressibilityPhasicitySpontaneityPropertiesSummary +---------+---------------+---------+-----------+----------+-------+ CFV      Full           Yes      Yes                          +---------+---------------+---------+-----------+----------+-------+ SFJ      Full                                                 +---------+---------------+---------+-----------+----------+-------+ FV Prox  Full                                                 +---------+---------------+---------+-----------+----------+-------+ FV Mid   Full                                                 +---------+---------------+---------+-----------+----------+-------+  FV DistalFull                                                 +---------+---------------+---------+-----------+----------+-------+ PFV      Full                                                  +---------+---------------+---------+-----------+----------+-------+ POP      Full           Yes      Yes                          +---------+---------------+---------+-----------+----------+-------+ PTV      Full                                                 +---------+---------------+---------+-----------+----------+-------+ PERO     Full                                                 +---------+---------------+---------+-----------+----------+-------+  Left Venous Findings: +---------+---------------+---------+-----------+----------+-------+          CompressibilityPhasicitySpontaneityPropertiesSummary +---------+---------------+---------+-----------+----------+-------+ CFV      Full           Yes      Yes                          +---------+---------------+---------+-----------+----------+-------+ SFJ      Full                                                 +---------+---------------+---------+-----------+----------+-------+ FV Prox  Full                                                 +---------+---------------+---------+-----------+----------+-------+ FV Mid   Full                                                 +---------+---------------+---------+-----------+----------+-------+ FV DistalFull                                                 +---------+---------------+---------+-----------+----------+-------+ PFV      Full                                                 +---------+---------------+---------+-----------+----------+-------+  POP      Full           Yes      Yes                          +---------+---------------+---------+-----------+----------+-------+ PTV      Full                                                 +---------+---------------+---------+-----------+----------+-------+ PERO     Full                                                  +---------+---------------+---------+-----------+----------+-------+    Summary: Right: There is no evidence of deep vein thrombosis in the lower extremity. No cystic structure found in the popliteal fossa. Left: There is no evidence of deep vein thrombosis in the lower extremity. No cystic structure found in the popliteal fossa.  *See table(s) above for measurements and observations.    Preliminary    Disposition   Pt is being discharged home today in good condition.  Follow-up Plans & Appointments    Follow-up Information    [facility]. Go on 07/13/2018.   Specialty:  Cardiology Why:  at 10:30AM in the [facility]. Park in deck under hospital off [address]. Follow banner for Heart and [facility] off [address].  Please bring all medications to appt.  Gate code is [phone] for JanCopy information: [address] [id number] UX [id number] [place] [phone]       Lorretta Harp, MD. Go on 07/31/2018.   Specialties:  Cardiology, Radiology Why:  9:15 am. For follow up on peripheral arterial disease and carotid stenosis.  Contact information: [address] [address] [place] [address] [phone]        Sanda Klein, MD Follow up on 08/17/2018.   Specialty:  Cardiology Why:  2:20 pm defibrilator check Contact information: [address] [place] [address] [phone]            Discharge Medications   Allergies as of 06/30/2018      Reactions   Morphine And Related Other (See Comments), Hypertension   Raises blood pressure, makes the patient "hyper"      Medication List    STOP taking these medications   metoprolol succinate 50 MG 24 hr tablet Commonly known as:  TOPROL-XL   naproxen sodium 220 MG tablet Commonly known as:  ALEVE     TAKE these medications   acetaminophen 325 MG tablet Commonly known as:  TYLENOL Take 2 tablets (650 mg total)  by mouth every 4 (four) hours as needed for headache or mild pain.   aspirin EC 81 MG tablet Take 81 mg by mouth daily.   carvedilol 3.125 MG tablet Commonly known as:  COREG Take 1 tablet (3.125 mg total) by mouth 2 (two) times daily with a meal.   clopidogrel 75 MG tablet Commonly known as:  PLAVIX TAKE 1 TABLET DAILY   digoxin 0.125 MG tablet Commonly known as:  LANOXIN Take 1 tablet (0.125 mg total) by mouth daily. Start taking on:  July 01, 2018  levothyroxine 25 MCG tablet Commonly known as:  SYNTHROID, LEVOTHROID Take 1 tablet (25 mcg total) by mouth daily before breakfast.   potassium chloride SA 20 MEQ tablet Commonly known as:  K-DUR,KLOR-CON Take 1 tablet (20 mEq total) by mouth daily. Start taking on:  July 01, 2018   ramipril 5 MG capsule Commonly known as:  ALTACE Take 1 capsule (5 mg total) by mouth daily.   rosuvastatin 40 MG tablet Commonly known as:  CRESTOR Take 1 tablet (40 mg total) by mouth daily. Start taking on:  July 01, 2018 What changed:    medication strength  how much to take   spironolactone 25 MG tablet Commonly known as:  ALDACTONE Take 1 tablet (25 mg total) by mouth at bedtime.   torsemide 20 MG tablet Commonly known as:  DEMADEX Take 1 tablet (20 mg total) by mouth daily. Start taking on:  July 01, 2018        Acute coronary syndrome (MI, NSTEMI, STEMI, etc) this admission?: No.    Outstanding Labs/Studies    Duration of Discharge Encounter   Greater than 50 minutes including physician time.  Signed, Kerin Ransom, PA-C 06/30/2018, 1:13 PM

## 2018-06-30 NOTE — Progress Notes (Signed)
CARDIAC REHAB PHASE I   PRE:  Rate/Rhythm: 78  BP:  Supine:   Sitting: 92/60  Standing:    SaO2: 99% RA  MODE:  Ambulation: 1410 ft   POST:  Rate/Rhythm:   BP:  Supine:   Sitting: 100/85  Standing:    SaO2: 99% RA  5670-1410 Patient tolerated ambulation well taking 3 laps around the unit without symptoms. Patient has CHF education packet in room, reviewed signs/symptoms to call physician, checking daily weights, and activity progression with patient. Pt verbalizes understanding of information given. Pt became emotional during our discussion, emotional support and encouragement given. Encouraged patient to talk to physician regarding counseling as needed.  Sol Passer, MS, ACSM CEP

## 2018-06-30 NOTE — Consult Note (Signed)
[address]       [place] [address]             [phone]        Martasia Jessee Sargent Medical Record #[id number] Date of Birth: [birth year]  Referring: No ref. provider found Primary Care: Glendale Chard, MD Primary Cardiologist : Loralie Champagne, MD  Chief Complaint:    Chief Complaint  Patient presents with  . Leg Swelling  Patient examined, images of coronary angiogram and echocardiogram and a scan of the chest personally reviewed.  Right heart cath data and vascular ultrasound port also reviewed.  History of Present Illness:     73 year old AA nondiabetic female smoker with history of CAD and ischemic cardiomyopathy status post AICD in 2011 presents with acute on chronic systolic heart failure, weight gain, edema, fatigue and mildly positive cardiac enzymes.  Echocardiogram shows EF 10-15%.  2 years ago her EF was approximately 30-35%.  She also has severe tricuspid regurgitation and moderate-severe aortic insufficiency.  RV dysfunction is moderate to severe.  She is in sinus rhythm and responded well to IV diuretics.  Her AICD has never discharged.  Right heart cath demonstrated normal filling pressures, mildly elevated pulmonary artery pressures and low cardiac index 1.7.  Patient has had previous bilateral iliac artery stents and is on chronic Plavix.  She has had a previous right carotid endarterectomy and carotid duplex scan show no significant carotid stenosis on either side.  The patient has been able to perform at a fairly good functional level until just recently.  Her weight has been stable.  Patient has mild-moderate COPD and CT scan of chest shows no suspicious pulmonary nodules or mediastinal adenopathy.  PFTs have not yet been performed.  Has had her heart failure medications adjusted and she is now on Coreg, Altase, Spironolactone, digoxin, and torsemide.  Because of the patient's severe LV dysfunction and admission for heart failure she  is at increased risk for mortality in the next 12 months.  She is currently being assessed for possible destination therapy mechanical cardiac support.  Her BSA is 1.7 with height of 5 feet 6 inches.  Her risk for implantable LVAD would include her moderate-severe RV dysfunction and the associated procedures she would need including tricuspid valve repair and aortic valve closure.  Her risk for requiring RVAD support would be increased due to the length of the necessary procedure for LVAD implantation.   Current Activity/ Functional Status: Up until recently patient's functional status has been good   Zubrod Score: At the time of surgery this patient's most appropriate activity status/level should be described as: []     0    Normal activity, no symptoms []     1    Restricted in physical strenuous activity but ambulatory, able to do out light work [x]     2    Ambulatory and capable of self care, unable to do work activities, up and about                 more than 50%  Of the time                            []     3    Only limited self care, in bed greater than 50% of waking hours []     4    Completely disabled, no self care, confined to bed or chair []   5    Moribund  Past Medical History:  Diagnosis Date  . Automatic implantable cardioverter-defibrillator in situ 2011   [employer]   . Carotid artery disease ([place])   . Coronary artery disease   . Dysrhythmia   . History of nuclear stress test 04/26/2012   mod-severe perfusion defect in basal inferior septal and basal inf, mid inferoseptal, mid inferio & apical inferior - consistent with infarct/scar; low risk   . Hypercholesteremia   . Hypertension   . ICD (implantable cardioverter-defibrillator) in place 03/08/2016  . PVD (peripheral vascular disease) ([place])   . Silent myocardial infarction ([place]) 2009; 2010  . Tobacco abuse     Past Surgical History:  Procedure Laterality Date  . ANGIOPLASTY / STENTING FEMORAL Right  2010  . CARDIAC DEFIBRILLATOR PLACEMENT  04/2010  . Carotid Duplex  04/2012   R ECA - severe vessel narrowing with inc velocities 70-99% diameter reduction; L subclav - elevated velocities 50-69% diameter reduction; L vertebral - occlusive disease; L bulb/prox ICA - elevated velocities in prox & mid segments on ICA, 50-69% diameter reduction   . CESAREAN SECTION  1979  . COLONOSCOPY  06/11/2012   Procedure: COLONOSCOPY;  Surgeon: Juanita Craver, MD;  Location: [facility];  Service: Endoscopy;  Laterality: N/A;  . ENDARTERECTOMY Right 2010  . ILIAC ARTERY STENT Bilateral 12/30/2013  . LOWER EXTREMITY ANGIOGRAM Bilateral 12/30/2013   Procedure: LOWER EXTREMITY ANGIOGRAM;  Surgeon: Lorretta Harp, MD;  Location: [facility] CATH LAB;  Service: Cardiovascular;  Laterality: Bilateral;  . Lower Extremity Arterial Doppler  04/2012   ABI 0.63 on R and 0.77 on L  . PERCUTANEOUS CORONARY STENT INTERVENTION (PCI-S)     in [place]  . RIGHT/LEFT HEART CATH AND CORONARY ANGIOGRAPHY N/A 06/29/2018   Procedure: RIGHT/LEFT HEART CATH AND CORONARY ANGIOGRAPHY;  Surgeon: Larey Dresser, MD;  Location: [facility];  Service: Cardiovascular;  Laterality: N/A;  . TRANSTHORACIC ECHOCARDIOGRAM  04/26/2012   EF 30-35%; imparied LV relaxation; mod post wall hypokinesis, mod-severe ant wall kypokinesis and severe septal hypokinesis; RV systolic function mod redcued, with RV systolic pressure elevated; mild AVR    Social History   Tobacco Use  Smoking Status Current Some Day Smoker  . Packs/day: 0.12  . Years: 46.00  . Pack years: 5.52  . Types: Cigarettes  Smokeless Tobacco Never Used  Tobacco Comment   12/30/2013 "down to less than 1 pack cigarettes/wk"    Social History   Substance and Sexual Activity  Alcohol Use No  She has 3 children, the closest lives in [place], her daughter.  Her husband is 44 in fairly good health with diabetes   Allergies  Allergen Reactions  . Morphine And Related Other (See  Comments) and Hypertension    Raises blood pressure, makes the patient "hyper"    Current Facility-Administered Medications  Medication Dose Route Frequency Provider Last Rate Last Dose  . 0.9 %  sodium chloride infusion  250 mL Intravenous PRN Bryna Colander, MD      . acetaminophen (TYLENOL) tablet 650 mg  650 mg Oral Q4H PRN Bryna Colander, MD      . aspirin EC tablet 81 mg  81 mg Oral Daily Bryna Colander, MD   81 mg at 06/30/18 3762  . carvedilol (COREG) tablet 3.125 mg  3.125 mg Oral BID WC Larey Dresser, MD   3.125 mg at 06/30/18 8315  . clopidogrel (PLAVIX) tablet 75 mg  75 mg Oral Daily Bryna Colander, MD  75 mg at 06/30/18 0923  . digoxin (LANOXIN) tablet 0.125 mg  0.125 mg Oral Daily Georgiana Shore, NP   0.125 mg at 06/30/18 5277  . heparin injection 5,000 Units  5,000 Units Subcutaneous Q8H Larey Dresser, MD   5,000 Units at 06/30/18 [phone]  . levothyroxine (SYNTHROID, LEVOTHROID) tablet 25 mcg  25 mcg Oral QAC breakfast Bryna Colander, MD   25 mcg at 06/30/18 0521  . ondansetron (ZOFRAN) injection 4 mg  4 mg Intravenous Q6H PRN Bryna Colander, MD      . potassium chloride SA (K-DUR,KLOR-CON) CR tablet 20 mEq  20 mEq Oral Daily Larey Dresser, MD   20 mEq at 06/30/18 1238  . ramipril (ALTACE) capsule 5 mg  5 mg Oral Daily Bryna Colander, MD   5 mg at 06/30/18 3536  . rosuvastatin (CRESTOR) tablet 40 mg  40 mg Oral Daily Georgiana Shore, NP   40 mg at 06/30/18 1443  . sodium chloride flush (NS) 0.9 % injection 3 mL  3 mL Intravenous Q12H Bryna Colander, MD   3 mL at 06/30/18 0927  . sodium chloride flush (NS) 0.9 % injection 3 mL  3 mL Intravenous PRN Bryna Colander, MD      . spironolactone (ALDACTONE) tablet 25 mg  25 mg Oral QHS Larey Dresser, MD   25 mg at 06/29/18 2231  . torsemide (DEMADEX) tablet 20 mg  20 mg Oral Daily Larey Dresser, MD   20 mg at 06/30/18 1540    Medications Prior to Admission  Medication Sig Dispense  Refill Last Dose  . aspirin EC 81 MG tablet Take 81 mg by mouth daily.   06/27/2018 at 0900  . clopidogrel (PLAVIX) 75 MG tablet TAKE 1 TABLET DAILY (Patient taking differently: Take 75 mg by mouth daily. ) 90 tablet 0 06/27/2018 at 0900  . levothyroxine (SYNTHROID, LEVOTHROID) 25 MCG tablet Take 1 tablet (25 mcg total) by mouth daily before breakfast. 30 tablet 2 06/27/2018 at am  . metoprolol succinate (TOPROL-XL) 50 MG 24 hr tablet Take 1 tablet (50 mg total) by mouth daily. Take with or immediately following a meal. 90 tablet 1 06/27/2018 at 0900  . naproxen sodium (ALEVE) 220 MG tablet Take 220-440 mg by mouth 2 (two) times daily as needed (for pain or headaches).   unk at unk  . ramipril (ALTACE) 5 MG capsule Take 1 capsule (5 mg total) by mouth daily. 90 capsule 1 06/27/2018 at am  . rosuvastatin (CRESTOR) 10 MG tablet Take 1 tablet (10 mg total) by mouth daily. 90 tablet 1 06/27/2018 at Unknown time    Family History  Problem Relation Age of Onset  . Leukemia Mother   . Pancreatic cancer Father   . Diabetes Maternal Grandmother   . Sudden death Maternal Grandfather   . Stroke Paternal Grandfather   . Rheum arthritis Sister   . Hypertension Son      Review of Systems:   ROS      Cardiac Review of Systems: Y or  [    ]= no  Chest Pain [    ]  Resting SOB [   ] Exertional SOB  [ y ]  Orthopnea [ y ]   Pedal Edema [ y  ]    Palpitations [  ] Syncope  [  ]   Presyncope [   ]  General Review of Systems: [Y] = yes [  ]=no Constitional: recent weight change [ y ];  anorexia [  ]; fatigue Blue.Reese  ]; nausea [  ]; night sweats [  ]; fever [  ]; or chills [  ]                                                               Dental: Last Dentist visit: Less than 1 year.  She has a fractured left maxillary tooth which was not pulled but has been treated with a filling instead of extraction and implant  Eye : blurred vision [  ]; diplopia [   ]; vision changes [  ];  Amaurosis fugax[  ]; Resp: cough [  ];   wheezing[  ];  hemoptysis[  ]; shortness of breath[y  ]; paroxysmal nocturnal dyspnea[  ]; dyspnea on exertion[  ]; or orthopnea[  ];  GI:  gallstones[  ], vomiting[  ];  dysphagia[  ]; melena[  ];  hematochezia [  ]; heartburn[  ];   Hx of  Colonoscopy[ y normal age 62]; GU: kidney stones [  ]; hematuria[  ];   dysuria [  ];  nocturia[  ];  history of     obstruction [  ]; urinary frequency [  ]             Skin: rash, swelling[  ];, hair loss[  ];  peripheral edema[ y ];  or itching[  ]; Musculosketetal: myalgias[  ];  joint swelling[  ];  joint erythema[  ];  joint pain[  ];  back pain[y  ];  Heme/Lymph: bruising[  ];  bleeding[  ];  anemia[  ];  Neuro: TIA[  ];  headaches[  ];  stroke[  ];  vertigo[  ];  seizures[  ];   paresthesias[  ];  difficulty walking[  ];  Psych:depression[  ]; anxiety[  ];  Endocrine: diabetes[  ];  thyroid dysfunction[  ];       Physical Exam: BP 109/69 (BP Location: Left Arm)   Pulse 70   Temp 98.2 F (36.8 C) (Oral)   Resp 18   Ht 5\' 6"  (1.676 m)   Wt 57.6 kg   SpO2 97%   BMI 20.48 kg/m        Physical Exam  General: Nourished 73 year old female no acute distress but anxious and depressed over her situation HEENT: Normocephalic pupils equal , dentition adequate Neck: Supple without JVD, adenopathy, or bruit Chest: Clear to auscultation, symmetrical breath sounds, no rhonchi, no tenderness             or deformity Cardiovascular: Regular rate and rhythm, no 2/6 systolic murmur, no gallop, peripheral pulses         1+    palpable in all extremities Abdomen:  Soft, nontender, no palpable mass or organomegaly Extremities: Warm, well-perfused, no clubbing cyanosis edema or tenderness,              no venous stasis changes of the legs Rectal/GU: Deferred Neuro: Grossly non--focal and symmetrical throughout Skin: Clean and dry without rash or ulceration   Diagnostic Studies & Laboratory data:     Recent Radiology Findings:   Ct Abdomen Pelvis  Wo Contrast  Result Date: 06/29/2018 CLINICAL DATA:  Assess for ventricular assist device candidacy EXAM: CT CHEST, ABDOMEN AND PELVIS WITHOUT  CONTRAST TECHNIQUE: Multidetector CT imaging of the chest, abdomen and pelvis was performed following the standard protocol without IV contrast. COMPARISON:  CT chest 06/27/2018 FINDINGS: CT CHEST FINDINGS Cardiovascular: Cardiomegaly with multi chamber enlargement. Pacemaker/AICD inserted from a left subclavian approach with its tip in the region of the right ventricle. Small amount of pericardial fluid. Extensive coronary artery calcification. Aortic atherosclerosis without aneurysm. Pulmonary arteries are not enlarged. Mediastinum/Nodes: No mass or lymphadenopathy. Lungs/Pleura: Background emphysema pattern without dominant bullous disease. No evidence of infiltrate or collapse. No mass or nodule in need of follow-up. No pleural fluid. Musculoskeletal: Minor inferior endplate fracture at Z56. CT ABDOMEN PELVIS FINDINGS Hepatobiliary: Liver appears normal without contrast. No calcified gallstones. Pancreas: Normal Spleen: Normal Adrenals/Urinary Tract: Adrenal glands are normal. 1.5 cm cyst at the lower pole of the left kidney. Small amount of residual contrast in both renal collecting systems. 1.5 cm cyst in the right renal hilar region. Stomach/Bowel: No primary bowel pathology or significant finding. Vascular/Lymphatic: Aortic atherosclerosis. Bilateral iliac region stents. IVC is normal. No retroperitoneal adenopathy. Reproductive: No pelvic mass. Other: No free fluid or air. Musculoskeletal: Ordinary lumbar degenerative changes. IMPRESSION: Cardiac enlargement with multi chamber enlargement. Extensive coronary artery calcification. Aortic atherosclerosis. Background emphysema without dominant bullous disease. No active pulmonary finding. No active abdominal or pelvic finding or obvious noncardiac contraindication for ventricular assist device. Aortic Atherosclerosis  (ICD10-I70.0) and Emphysema (ICD10-J43.9). Electronically Signed   By: Nelson Chimes M.D.   On: 06/29/2018 20:17   Ct Chest Wo Contrast  Result Date: 06/29/2018 CLINICAL DATA:  Assess for ventricular assist device candidacy EXAM: CT CHEST, ABDOMEN AND PELVIS WITHOUT CONTRAST TECHNIQUE: Multidetector CT imaging of the chest, abdomen and pelvis was performed following the standard protocol without IV contrast. COMPARISON:  CT chest 06/27/2018 FINDINGS: CT CHEST FINDINGS Cardiovascular: Cardiomegaly with multi chamber enlargement. Pacemaker/AICD inserted from a left subclavian approach with its tip in the region of the right ventricle. Small amount of pericardial fluid. Extensive coronary artery calcification. Aortic atherosclerosis without aneurysm. Pulmonary arteries are not enlarged. Mediastinum/Nodes: No mass or lymphadenopathy. Lungs/Pleura: Background emphysema pattern without dominant bullous disease. No evidence of infiltrate or collapse. No mass or nodule in need of follow-up. No pleural fluid. Musculoskeletal: Minor inferior endplate fracture at L87. CT ABDOMEN PELVIS FINDINGS Hepatobiliary: Liver appears normal without contrast. No calcified gallstones. Pancreas: Normal Spleen: Normal Adrenals/Urinary Tract: Adrenal glands are normal. 1.5 cm cyst at the lower pole of the left kidney. Small amount of residual contrast in both renal collecting systems. 1.5 cm cyst in the right renal hilar region. Stomach/Bowel: No primary bowel pathology or significant finding. Vascular/Lymphatic: Aortic atherosclerosis. Bilateral iliac region stents. IVC is normal. No retroperitoneal adenopathy. Reproductive: No pelvic mass. Other: No free fluid or air. Musculoskeletal: Ordinary lumbar degenerative changes. IMPRESSION: Cardiac enlargement with multi chamber enlargement. Extensive coronary artery calcification. Aortic atherosclerosis. Background emphysema without dominant bullous disease. No active pulmonary finding. No  active abdominal or pelvic finding or obvious noncardiac contraindication for ventricular assist device. Aortic Atherosclerosis (ICD10-I70.0) and Emphysema (ICD10-J43.9). Electronically Signed   By: Nelson Chimes M.D.   On: 06/29/2018 20:17   Vas US Carotid  Result Date: 06/28/2018 Carotid Arterial Duplex Study Indications:       Carotid stenosis. Right Endarterectomy on 2010. Risk Factors:      Current smoker. Comparison Study:  Carotid duplex exam on 2017 Performing Technologist: Rudell Cobb Supporting Technologist: Oda Cogan RDMS, RVT  Examination Guidelines: A complete evaluation includes B-mode imaging, spectral Doppler, color Doppler,  and power Doppler as needed of all accessible portions of each vessel. Bilateral testing is considered an integral part of a complete examination. Limited examinations for reoccurring indications may be performed as noted.  Right Carotid Findings: +----------+--------+--------+--------+------------------+---------------------+           PSV cm/sEDV cm/sStenosisDescribe          Comments              +----------+--------+--------+--------+------------------+---------------------+ CCA Prox  89      15              diffuse,          intimal irregular                                       heterogenous and  thickening vs                                           irregular         irregular plaques     +----------+--------+--------+--------+------------------+---------------------+ CCA Mid                                             intimal questionable                                                      tearing?              +----------+--------+--------+--------+------------------+---------------------+ CCA Distal119     21              heterogenous                            +----------+--------+--------+--------+------------------+---------------------+ ICA Prox  131     23      1-39%   heterogenous      post  endarterectomy   +----------+--------+--------+--------+------------------+---------------------+ ICA Mid   74      16                                                      +----------+--------+--------+--------+------------------+---------------------+ ICA Distal108     29                                                      +----------+--------+--------+--------+------------------+---------------------+ ECA       103                                                             +----------+--------+--------+--------+------------------+---------------------+ +----------+--------+-------+--------+-------------------+  PSV cm/sEDV cmsDescribeArm Pressure (mmHG) +----------+--------+-------+--------+-------------------+ [id number]                                        +----------+--------+-------+--------+-------------------+ +---------+--------+--+--------+--+---------+ VertebralPSV cm/s65EDV cm/s16Antegrade +---------+--------+--+--------+--+---------+  Left Carotid Findings: +----------+--------+--------+--------+-------------------+--------------------+           PSV cm/sEDV cm/sStenosisDescribe           Comments             +----------+--------+--------+--------+-------------------+--------------------+ CCA Prox  66      14                                 intimal thickening                                                        with irregular                                                            plaque debris        +----------+--------+--------+--------+-------------------+--------------------+ CCA Mid   59      12                                 irregular intimal                                                         thickening           +----------+--------+--------+--------+-------------------+--------------------+ CCA Distal58      12                                 intimal irregular     +----------+--------+--------+--------+-------------------+--------------------+ ICA Prox  189     42      40-59%  calcific and                                                              irregular                               +----------+--------+--------+--------+-------------------+--------------------+ ICA Mid   97      17              calcific                                +----------+--------+--------+--------+-------------------+--------------------+ ICA Distal81      18                                                      +----------+--------+--------+--------+-------------------+--------------------+  ECA       342             >50%                                            +----------+--------+--------+--------+-------------------+--------------------+ +----------+--------+--------+--------+-------------------+ SubclavianPSV cm/sEDV cm/sDescribeArm Pressure (mmHG) +----------+--------+--------+--------+-------------------+           165                                         +----------+--------+--------+--------+-------------------+ +---------+--------+--+--------+--+---------+ VertebralPSV cm/s28EDV cm/s13Antegrade +---------+--------+--+--------+--+---------+  Summary: Right Carotid: Velocities in the right ICA are consistent with a 1-39% stenosis. Left Carotid: Velocities in the left ICA are consistent with a 40-59% stenosis. Vertebrals: Bilateral vertebral arteries demonstrate antegrade flow. *See table(s) above for measurements and observations.  Electronically signed by Curt Jews MD on 06/28/2018 at 5:34:43 PM.    Final    Vas [place] Lower Extremity Venous (dvt)  Result Date: 06/29/2018  Lower Venous Study Indications: Pvd.  Performing Technologist: Abram Sander RVS  Examination Guidelines: A complete evaluation includes B-mode imaging, spectral Doppler, color Doppler, and power Doppler as needed of all accessible portions of each vessel.  Bilateral testing is considered an integral part of a complete examination. Limited examinations for reoccurring indications may be performed as noted.  Right Venous Findings: +---------+---------------+---------+-----------+----------+-------+          CompressibilityPhasicitySpontaneityPropertiesSummary +---------+---------------+---------+-----------+----------+-------+ CFV      Full           Yes      Yes                          +---------+---------------+---------+-----------+----------+-------+ SFJ      Full                                                 +---------+---------------+---------+-----------+----------+-------+ FV Prox  Full                                                 +---------+---------------+---------+-----------+----------+-------+ FV Mid   Full                                                 +---------+---------------+---------+-----------+----------+-------+ FV DistalFull                                                 +---------+---------------+---------+-----------+----------+-------+ PFV      Full                                                 +---------+---------------+---------+-----------+----------+-------+ POP  Full           Yes      Yes                          +---------+---------------+---------+-----------+----------+-------+ PTV      Full                                                 +---------+---------------+---------+-----------+----------+-------+ PERO     Full                                                 +---------+---------------+---------+-----------+----------+-------+  Left Venous Findings: +---------+---------------+---------+-----------+----------+-------+          CompressibilityPhasicitySpontaneityPropertiesSummary +---------+---------------+---------+-----------+----------+-------+ CFV      Full           Yes      Yes                           +---------+---------------+---------+-----------+----------+-------+ SFJ      Full                                                 +---------+---------------+---------+-----------+----------+-------+ FV Prox  Full                                                 +---------+---------------+---------+-----------+----------+-------+ FV Mid   Full                                                 +---------+---------------+---------+-----------+----------+-------+ FV DistalFull                                                 +---------+---------------+---------+-----------+----------+-------+ PFV      Full                                                 +---------+---------------+---------+-----------+----------+-------+ POP      Full           Yes      Yes                          +---------+---------------+---------+-----------+----------+-------+ PTV      Full                                                 +---------+---------------+---------+-----------+----------+-------+ PERO  Full                                                 +---------+---------------+---------+-----------+----------+-------+    Summary: Right: There is no evidence of deep vein thrombosis in the lower extremity. No cystic structure found in the popliteal fossa. Left: There is no evidence of deep vein thrombosis in the lower extremity. No cystic structure found in the popliteal fossa.  *See table(s) above for measurements and observations.    Preliminary      I have independently reviewed the above radiologic studies and discussed with the patient   Recent Lab Findings: Lab Results  Component Value Date   WBC 8.6 06/29/2018   HGB 14.6 06/29/2018   HCT 45.6 06/29/2018   PLT 165 06/29/2018   GLUCOSE 121 (H) 06/30/2018   CHOL 182 06/21/2018   TRIG 99 06/21/2018   HDL 43 06/21/2018   LDLCALC 119 (H) 06/21/2018   ALT 31 06/22/2018   AST 29 06/22/2018   NA 137 06/30/2018   K  3.9 06/30/2018   CL 102 06/30/2018   CREATININE 0.93 06/30/2018   BUN 18 06/30/2018   CO2 25 06/30/2018   TSH 10.249 (H) 06/27/2018   INR 1.02 12/23/2013   HGBA1C 5.8 (H) 06/30/2018      Assessment / Plan:   Ischemic cardiomyopathy with severe LV dysfunction, moderate to severe RV dysfunction with associated severe TR and significant AI.  Patient would be high risk for labral LVAD but this would be her best long-term option.  Would anticipate significant need for temporary RVAD support.  Having access to [facility] duo rvad device would be important in planning her surgical care.       06/30/2018 3:07 PM

## 2018-07-02 ENCOUNTER — Encounter (HOSPITAL_COMMUNITY): Payer: Self-pay | Admitting: *Deleted

## 2018-07-02 ENCOUNTER — Encounter (HOSPITAL_COMMUNITY): Payer: Medicare Other

## 2018-07-02 ENCOUNTER — Other Ambulatory Visit (HOSPITAL_COMMUNITY): Payer: Self-pay | Admitting: *Deleted

## 2018-07-02 ENCOUNTER — Telehealth: Payer: Self-pay

## 2018-07-02 DIAGNOSIS — Z01818 Encounter for other preprocedural examination: Secondary | ICD-10-CM

## 2018-07-02 MED FILL — Lidocaine HCl Local Preservative Free (PF) Inj 1%: INTRAMUSCULAR | Qty: 30 | Status: AC

## 2018-07-02 NOTE — Telephone Encounter (Signed)
The pt was called to schedule an appt for a hospital f/u.  The pt was also asked about her medications on if they were changed and the pt said yes and that she is concerned because the provider should have access to the information from the hospital.  I told the pt that she does but that I was going to go over her medication list.

## 2018-07-02 NOTE — Consult Note (Signed)
            [facility] CM Primary Care Navigator  07/02/2018  Andrea Hubbard 1946-04-07 [id number]   Attempt to see patient at the bedside to identify possible discharge needs butshe was already discharged home over the weekend.  Per MD note,patient presented to [facility] emergency room with elevated BNP- congestive heart failure and had been having shortness of breath, lower extremity edema and increasing fatigue, with  echocardiogram revealing an ejection fraction of 10- 15% comparing to previous echo which was 30- 35%. (status post cardiac cath).  Patient is being closely followed-upat the [facility] with scheduled for follow-up visit on 07/13/2018.   Patient has discharge instruction to follow-up with cardiology on 07/31/2018 and 08/17/2018.   Primary care provider's office called and notified of patient's health issues needing close follow-up and made aware to refer patient to [facility] care management if deemed necessary and appropriate for any services.   For additional questions please contact:  Edwena Felty A. Lemya Greenwell, BSN, RN-BC [facility] PRIMARY CARE Navigator Cell: [phone]

## 2018-07-03 ENCOUNTER — Ambulatory Visit: Payer: Medicare Other | Admitting: Cardiovascular Disease

## 2018-07-03 LAB — LUPUS ANTICOAGULANT PANEL
DRVVT: 34.3 s (ref 0.0–47.0)
PTT Lupus Anticoagulant: 39.4 s (ref 0.0–51.9)

## 2018-07-03 LAB — HEPATITIS C ANTIBODY: HCV Ab: 0.1 s/co ratio (ref 0.0–0.9)

## 2018-07-03 LAB — HEPATITIS B SURFACE ANTIGEN: Hepatitis B Surface Ag: NEGATIVE

## 2018-07-03 LAB — HEPATITIS B SURFACE ANTIBODY, QUANTITATIVE: Hep B S AB Quant (Post): <3.1 m[IU]/mL — ABNORMAL LOW (ref >9.9)

## 2018-07-03 LAB — HEPATITIS B CORE ANTIBODY, IGM: Hep B C IgM: NEGATIVE

## 2018-07-05 ENCOUNTER — Telehealth: Payer: Self-pay | Admitting: Pharmacist

## 2018-07-05 ENCOUNTER — Ambulatory Visit (INDEPENDENT_AMBULATORY_CARE_PROVIDER_SITE_OTHER): Payer: Medicare Other | Admitting: Nurse Practitioner

## 2018-07-05 ENCOUNTER — Encounter: Payer: Self-pay | Admitting: Nurse Practitioner

## 2018-07-05 VITALS — BP 102/60 | HR 76 | Temp 97.2°F | Ht 65.8 in | Wt 122.4 lb

## 2018-07-05 DIAGNOSIS — I739 Peripheral vascular disease, unspecified: Secondary | ICD-10-CM

## 2018-07-05 DIAGNOSIS — I5022 Chronic systolic (congestive) heart failure: Secondary | ICD-10-CM | POA: Diagnosis not present

## 2018-07-05 LAB — FACTOR 5 LEIDEN

## 2018-07-05 NOTE — Telephone Encounter (Signed)
LMOM; patient to call back and schedule appt with pharmacist lipid clinic for potential [id number] initiation.

## 2018-07-05 NOTE — Progress Notes (Signed)
Subjective:     Patient ID: Andrea Hubbard , female    DOB: Nov 16, 1945 , 73 y.o.   MRN: [id number]   Chief Complaint  Patient presents with  . Hospitalization Follow-up    patient states she was in the hospital for 3 days for ankle swelling she has improved.     HPI  Here for hospital follow up after hospital admission on 06/27/17 - 06/30/17 for Congestive heart failure.  She does not need a home health nurse.  She is now on Torsemide, Potassium. During her hospitalization she was diuresed. She was found to have an occluded RCA with left to right collateralization, she had 60-70% ostial occlusion. Medical therapy was recommended.      She is fairly tired getting adjusted to nine total pills.   She reports she has lost 10 lbs since being home from the hospital.  She is having diarrhea.  Lightedheadedness intermittently but feeling better than her hospitalization.     Past Medical History:  Diagnosis Date  . Automatic implantable cardioverter-defibrillator in situ 2011   [employer]   . Carotid artery disease ([place])   . Coronary artery disease   . Dysrhythmia   . History of nuclear stress test 04/26/2012   mod-severe perfusion defect in basal inferior septal and basal inf, mid inferoseptal, mid inferio & apical inferior - consistent with infarct/scar; low risk   . Hypercholesteremia   . Hypertension   . ICD (implantable cardioverter-defibrillator) in place 03/08/2016  . PVD (peripheral vascular disease) ([place])   . Silent myocardial infarction ([place]) 2009; 2010  . Tobacco abuse      Family History  Problem Relation Age of Onset  . Leukemia Mother   . Pancreatic cancer Father   . Diabetes Maternal Grandmother   . Sudden death Maternal Grandfather   . Stroke Paternal Grandfather   . Rheum arthritis Sister   . Hypertension Son      Current Outpatient Medications:  .  acetaminophen (TYLENOL) 325 MG tablet, Take 2 tablets (650 mg total) by mouth every 4 (four) hours as  needed for headache or mild pain., Disp: , Rfl:  .  aspirin EC 81 MG tablet, Take 81 mg by mouth daily., Disp: , Rfl:  .  carvedilol (COREG) 3.125 MG tablet, Take 1 tablet (3.125 mg total) by mouth 2 (two) times daily with a meal., Disp: 60 tablet, Rfl: 5 .  clopidogrel (PLAVIX) 75 MG tablet, TAKE 1 TABLET DAILY (Patient taking differently: Take 75 mg by mouth daily. ), Disp: 90 tablet, Rfl: 0 .  digoxin (LANOXIN) 0.125 MG tablet, Take 1 tablet (0.125 mg total) by mouth daily., Disp: 90 tablet, Rfl: 3 .  levothyroxine (SYNTHROID, LEVOTHROID) 25 MCG tablet, Take 1 tablet (25 mcg total) by mouth daily before breakfast., Disp: 30 tablet, Rfl: 2 .  potassium chloride SA (K-DUR,KLOR-CON) 20 MEQ tablet, Take 1 tablet (20 mEq total) by mouth daily., Disp: 30 tablet, Rfl: 5 .  ramipril (ALTACE) 5 MG capsule, Take 1 capsule (5 mg total) by mouth daily., Disp: 90 capsule, Rfl: 1 .  rosuvastatin (CRESTOR) 40 MG tablet, Take 1 tablet (40 mg total) by mouth daily., Disp: 90 tablet, Rfl: 3 .  spironolactone (ALDACTONE) 25 MG tablet, Take 1 tablet (25 mg total) by mouth at bedtime., Disp: 30 tablet, Rfl: 5 .  torsemide (DEMADEX) 20 MG tablet, Take 1 tablet (20 mg total) by mouth daily., Disp: 30 tablet, Rfl: 5   Allergies  Allergen Reactions  . Morphine  And Related Other (See Comments) and Hypertension    Raises blood pressure, makes the patient "hyper"     Review of Systems  Constitutional: Positive for fatigue.  Eyes: Negative for visual disturbance.  Respiratory: Negative.  Negative for shortness of breath.   Cardiovascular: Negative.  Negative for chest pain, palpitations and leg swelling.  Gastrointestinal: Negative.   Endocrine: Negative.   Musculoskeletal: Negative.   Skin: Negative.   Neurological: Negative for dizziness, weakness and headaches.  Psychiatric/Behavioral: Negative for confusion. The patient is not nervous/anxious.      Today's Vitals   07/05/18 1214  BP: 102/60  Pulse: 76   Temp: (!) 97.2 F (36.2 C)  TempSrc: Oral  Weight: 122 lb 6.4 oz (55.5 kg)  Height: 5' 5.8" (1.671 m)  PainSc: 0-No pain   Body mass index is 19.88 kg/m.   Objective:  Physical Exam Constitutional:      General: She is not in acute distress.    Appearance: Normal appearance. She is well-developed. She is not ill-appearing.     Comments: Looks tired today compared to her previous visit  HENT:     Head: Normocephalic and atraumatic.  Eyes:     Pupils: Pupils are equal, round, and reactive to light.  Cardiovascular:     Rate and Rhythm: Normal rate and regular rhythm.     Pulses: Normal pulses.     Heart sounds: Normal heart sounds. No murmur.     Comments: No edema noted Pulmonary:     Effort: Pulmonary effort is normal.     Breath sounds: Normal breath sounds.  Musculoskeletal: Normal range of motion.  Skin:    General: Skin is warm and dry.     Capillary Refill: Capillary refill takes less than 2 seconds.  Neurological:     General: No focal deficit present.     Mental Status: She is alert and oriented to person, place, and time.     Cranial Nerves: No cranial nerve deficit.  Psychiatric:        Mood and Affect: Mood normal.         Assessment And Plan:      1. Chronic systolic congestive heart failure (HCC)  Tcm performed. A member of the clinical team spoke with the patient upon discharge. Discharge summary was reviewed in full detail during the visit. Meds reconciled and compared to discharge meds. Medication list was updated and reviewed with the patient. Greater than 50% face to face time was spent in counseling and coordination of care. All questions were answered to the satisfaction of the patient.    Admitted to [facility] 1/1 - 1/4 for CHF, she had previously been without her medications for several months and hypothyroid.  No need for Home Health/PT  Continues to be fatigued but declines PT  Continue with follow up with Cardiology  - BMP8+eGFR  2. PAD  (peripheral artery disease),   She is to follow up with Dr. Gwenlyn Found for her PAD  She is also taking clopidrogel daily   Decreased pedal pulses bilaterally         Minette Brine, FNP

## 2018-07-06 LAB — BMP8+EGFR
BUN/Creatinine Ratio: 19 (ref 12–28)
BUN: 26 mg/dL (ref 8–27)
CO2: 22 mmol/L (ref 20–29)
Calcium: 9.9 mg/dL (ref 8.7–10.3)
Chloride: 97 mmol/L (ref 96–106)
Creatinine, Ser: 1.35 mg/dL — ABNORMAL HIGH (ref 0.57–1.00)
GFR calc non Af Amer: 40 mL/min/{1.73_m2} — ABNORMAL LOW (ref >59)
GFR, EST AFRICAN AMERICAN: 46 mL/min/{1.73_m2} — AB (ref >59)
Glucose: 107 mg/dL — ABNORMAL HIGH (ref 65–99)
Potassium: 5.2 mmol/L (ref 3.5–5.2)
Sodium: 138 mmol/L (ref 134–144)

## 2018-07-09 ENCOUNTER — Telehealth (HOSPITAL_COMMUNITY): Payer: Self-pay | Admitting: Unknown Physician Specialty

## 2018-07-09 MED ORDER — TORSEMIDE 20 MG PO TABS: 10.0000 mg | ORAL_TABLET | Freq: Every day | ORAL | 5 refills | Status: DC

## 2018-07-09 NOTE — Telephone Encounter (Signed)
Noticed pts lab work from PCP office on Friday while getting data together for MRB today. pts CR 0.93>1.35 and K+ 5.2. Called pt at home and instructed to STOP potassium, hold spiro and Torsemide tomorrow. On Wednesday resume Torsemide at lower dose of 10 mg daily and continue 25 mg of Spiro daily. Pt states that she is feeling tired and is concerned about her lack of energy. We will f/u with the pt in VAD clinic on Friday and recheck BMET at that time. D/w Dr. Aundra Dubin.   Tanda Rockers RN, BSN VAD Coordinator 24/7 Pager [phone]

## 2018-07-10 ENCOUNTER — Encounter (HOSPITAL_COMMUNITY): Payer: Medicare Other

## 2018-07-13 ENCOUNTER — Ambulatory Visit (HOSPITAL_BASED_OUTPATIENT_CLINIC_OR_DEPARTMENT_OTHER)
Admission: RE | Admit: 2018-07-13 | Discharge: 2018-07-13 | Disposition: A | Discharge status: Home / Self Care | Payer: Medicare Other | Source: Ambulatory Visit | Attending: Internal Medicine | Admitting: Internal Medicine

## 2018-07-13 ENCOUNTER — Telehealth (HOSPITAL_COMMUNITY): Payer: Self-pay | Admitting: *Deleted

## 2018-07-13 ENCOUNTER — Inpatient Hospital Stay (HOSPITAL_COMMUNITY): Payer: Medicare Other

## 2018-07-13 ENCOUNTER — Other Ambulatory Visit (HOSPITAL_COMMUNITY): Payer: Self-pay | Admitting: *Deleted

## 2018-07-13 ENCOUNTER — Ambulatory Visit (HOSPITAL_COMMUNITY)
Admission: RE | Admit: 2018-07-13 | Discharge: 2018-07-13 | Disposition: A | Discharge status: Home / Self Care | Payer: Medicare Other | Source: Ambulatory Visit | Attending: Cardiology | Admitting: Cardiology

## 2018-07-13 ENCOUNTER — Encounter (HOSPITAL_COMMUNITY): Payer: Self-pay

## 2018-07-13 VITALS — BP 106/62 | HR 72 | Ht 66.0 in | Wt 125.0 lb

## 2018-07-13 DIAGNOSIS — I5043 Acute on chronic combined systolic (congestive) and diastolic (congestive) heart failure: Secondary | ICD-10-CM

## 2018-07-13 DIAGNOSIS — I739 Peripheral vascular disease, unspecified: Secondary | ICD-10-CM | POA: Insufficient documentation

## 2018-07-13 DIAGNOSIS — I5023 Acute on chronic systolic (congestive) heart failure: Secondary | ICD-10-CM | POA: Insufficient documentation

## 2018-07-13 DIAGNOSIS — I5022 Chronic systolic (congestive) heart failure: Secondary | ICD-10-CM | POA: Insufficient documentation

## 2018-07-13 DIAGNOSIS — I2510 Atherosclerotic heart disease of native coronary artery without angina pectoris: Secondary | ICD-10-CM | POA: Insufficient documentation

## 2018-07-13 LAB — CBC
HCT: 52.5 % — ABNORMAL HIGH (ref 36.0–46.0)
Hemoglobin: 15.9 g/dL — ABNORMAL HIGH (ref 12.0–15.0)
MCH: 26.1 pg (ref 26.0–34.0)
MCHC: 30.3 g/dL (ref 30.0–36.0)
MCV: 86.1 fL (ref 80.0–100.0)
Platelets: 221 10*3/uL (ref 150–400)
RBC: 6.1 MIL/uL — ABNORMAL HIGH (ref 3.87–5.11)
RDW: 14.2 % (ref 11.5–15.5)
WBC: 10.1 10*3/uL (ref 4.0–10.5)
nRBC: 0 % (ref 0.0–0.2)

## 2018-07-13 LAB — PULMONARY FUNCTION TEST
DL/VA % pred: 46 %
DL/VA: 2.33 ml/min/mmHg/L
DLCO COR % PRED: 32 %
DLCO COR: 8.8 ml/min/mmHg
DLCO unc % pred: 33 %
DLCO unc: 9.11 ml/min/mmHg
FEF 25-75 Post: 0.69 L/sec
FEF 25-75 Pre: 0.52 L/sec
FEF2575-%Change-Post: 33 %
FEF2575-%PRED-PRE: 26 %
FEF2575-%Pred-Post: 35 %
FEV1-%Change-Post: 6 %
FEV1-%Pred-Post: 58 %
FEV1-%Pred-Pre: 54 %
FEV1-Post: 1.42 L
FEV1-Pre: 1.33 L
FEV1FVC-%Change-Post: -12 %
FEV1FVC-%Pred-Pre: 86 %
FEV6-%Change-Post: 18 %
FEV6-%Pred-Post: 70 %
FEV6-%Pred-Pre: 59 %
FEV6-POST: 2.16 L
FEV6-Pre: 1.81 L
FEV6FVC-%Change-Post: -8 %
FEV6FVC-%Pred-Post: 90 %
FEV6FVC-%Pred-Pre: 99 %
FVC-%Change-Post: 21 %
FVC-%PRED-PRE: 63 %
FVC-%Pred-Post: 77 %
FVC-POST: 2.49 L
FVC-PRE: 2.05 L
Post FEV1/FVC ratio: 57 %
Post FEV6/FVC ratio: 87 %
Pre FEV1/FVC ratio: 65 %
Pre FEV6/FVC Ratio: 95 %
RV % pred: 326 %
RV: 7.54 L
TLC % PRED: 177 %
TLC: 9.53 L

## 2018-07-13 LAB — BASIC METABOLIC PANEL
Anion gap: 10 (ref 5–15)
BUN: 22 mg/dL (ref 8–23)
CALCIUM: 9.5 mg/dL (ref 8.9–10.3)
CO2: 28 mmol/L (ref 22–32)
Chloride: 99 mmol/L (ref 98–111)
Creatinine, Ser: 1.19 mg/dL — ABNORMAL HIGH (ref 0.44–1.00)
GFR calc non Af Amer: 46 mL/min — ABNORMAL LOW (ref >60)
GFR, EST AFRICAN AMERICAN: 53 mL/min — AB (ref >60)
Glucose, Bld: 119 mg/dL — ABNORMAL HIGH (ref 70–99)
Potassium: 4.7 mmol/L (ref 3.5–5.1)
Sodium: 137 mmol/L (ref 135–145)

## 2018-07-13 LAB — DIGOXIN LEVEL: Digoxin Level: 0.8 ng/mL (ref 0.8–2.0)

## 2018-07-13 MED ORDER — SACUBITRIL-VALSARTAN 24-26 MG PO TABS: 1.0000 | ORAL_TABLET | Freq: Two times a day (BID) | ORAL | 6 refills | Status: DC

## 2018-07-13 MED ORDER — TORSEMIDE 20 MG PO TABS: 10.0000 mg | ORAL_TABLET | ORAL | 5 refills | Status: DC

## 2018-07-13 MED ORDER — ALBUTEROL SULFATE (2.5 MG/3ML) 0.083% IN NEBU
2.5000 mg | INHALATION_SOLUTION | Freq: Once | RESPIRATORY_TRACT | Status: AC
  Administered 2018-07-13: 2.5 mg via RESPIRATORY_TRACT

## 2018-07-13 NOTE — Patient Instructions (Signed)
1. Decrease torsemide to 10 mg every other day. 2. Will call you with lab results and possibly make more med changes. 3. Will schedule you a CPX test 4. Return to [place] clinic in 2 weeks.

## 2018-07-13 NOTE — Telephone Encounter (Signed)
Called pt and left message per Dr. Aundra Dubin. Asked pt to call VAD office at [phone] for additional instructions and to schedule CPX.  Message left for patient: 1. Stop Ramipril. In 36 hrs start Entresto 24/26 mg twice daily. 2. Rx sent to local pharmacy for [facility], if any issues with cost, please call Jenna at [phone] for drug assistance. 3. You will need labs in 10 days to check your kidney function (BMET). 4. We need to schedule you for CPX test as soon as possible. I have dates to discuss with you, please call back at [phone].  Zada Girt RN, [place] Coordinator [phone]

## 2018-07-13 NOTE — Progress Notes (Addendum)
Patient presents for hospital f/u in [facility] today. Recent hospitalization with initiation of VAD evaluation.    Pt reports she has been feeling better overall, but does express concern over falling asleep "as soon as I get still".   Pt saw PCP on 07/05/17 with elevated creat (1.35 and elevated K= (5.2). Per Dr. Aundra Dubin K-dur was stopped; Torsemide and Arlyce Harman was held one day; then Torsemide decreased to 10 mg daily.   Vital Signs:  Lying:  Standing: HR:  68  72 BP:   118/61 (92) 106/62 (79)  SPO2: 100 % on RA   Weight: 125 lbslb w/o eqt Hosp d/c wt: 113.5 lb Home weights: 122 - 125 lbs   Symptom YES NO DETAILS  Angina  x Activity:  Claudication  x How Far:  Syncope  x When:  Stroke  x   Orthopnea  x How many pillows:  2 pillows  PND  x How often:  CPAP  x How many hours:  Pedal Edema  x   Abdominal Fullness  x   Nausea / Vomit  x Fair appetite; denies early satiety  Diaphoresis  x When:  Shortness of Breath x  Activity: walking 2 blocks level ground; any incline  Palpitations  x When:  ICD shock  x   Hospitalizations x  1/1 - 06/30/18 CHF  Emergency Room x    Other MD  x 07/05/18 Darleen Crocker NP  Activity Household chores, takes her time, rests when she gets tired  Fluid > 4Liters/day - "to protect my kidneys"  Diet No added salt    Device:  [employer] single lead Therapies: on 185 bpm Pacing: VVI 40 Last check: 06/07/16   Patient Instructions:  1. Decrease torsemide to 10 mg every other day. 2. Will call you with lab results and possibly make more med changes. 3. Will schedule you a CPX test 4. Return to [place] clinic in 2 weeks to see App.    Zada Girt, RN VAD Coordinator    Office: [phone] 24/7 Emergency VAD Pager: [phone]  PCP: Glendale Chard, MD HF Cardiology: Dr. Aundra Dubin  73 yo with history of CAD, COPD, PAD, and ischemic cardiomyopathy with chronic systolic CHF presents for cardiology followup.  She was seen by Dr. Gwenlyn Found in the past and  developed increased dyspnea in 12/19. Echo in 12/19 showed EF 10-15% with moderately reduced RV systolic function and moderate-severe AI.  She was then admitted in 1/20 with increased dyspnea and peripheral edema.  She was diuresed and right/left heart cath was done.  Cardiac index was low at 1.7.  She had significant CAD but there were no interventional targets.  Medications were adjusted and she was discharged home.   She has quit smoking last admission. She is breathing much better than prior to hospitalization but is still limited.  She can walk a couple of blocks then is short of breath. She is short of breath walking up stairs or an incline.  She fatigues easily.  No chest pain. No claudication.   Labs (12/19): LDL 119 Labs (1/20): digoxin 0.8, K 4.7, creatinine 1.19  ECG (personally reviewed): NSR, old inferior MI, old anterior MI.   PMH: 1. COPD: Quit smoking 1/20.   - PFTs (1/20): FVC 63%, FEV1 54%, TLC 177%, DLCO 33%. Moderately severe emphysema.  2. Carotid stenosis: s/p right CEA in 2010.  - Carotid dopplers (0/62): 37-62% LICA stenosis, s/p R CEA.  3. PAD: s/p PCI to right CIA and left CIA in  2015.  Known right SFA occlusion.  - ABIs 0.63 right, 0.69 left.  4. CAD: MI with PCI in 2010 in [place].  - 1/20 LHC: 40% pLAD, 95% stenosis proximal small-moderate D1, small-moderate D2 70% ostial stenosis, small D3 99% proximal stenosis, 60-70% ostial stenosis small-moderate OM1, patent stent in OM2, occluded RCA at ostium with left => right collaterals.  5. Chronic systolic CHF: Ischemic cardiomyopathy.  [place].  - Echo (12/19): EF 10-15% with mild LV dilation, diffuse hypokinesis, moderate-severe AI, mild MR, PASP 61 mmHg, moderately reduced RV systolic function, severe TR.  - RHC (1/20): mean RA 6, PA 40/16 mean 25, mean PCWP 12, CI 1.7, PVR 4.6 WU. PAPi 4.  6. HTN 7. Hypothyroidism 8. Hyperlipidemia  Social History   Socioeconomic History  . Marital status:  Married    Spouse name: Not on file  . Number of children: 3  . Years of education: coolege   . Highest education level: Not on file  Occupational History  . 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 Financial resource strain: Not on file  . Food insecurity:    Worry: Not on file    Inability: Not on file  . Transportation needs:    Medical: Not on file    Non-medical: Not on file  Tobacco Use  . Smoking status: Current Some Day Smoker    Packs/day: 0.12    Years: 46.00    Pack years: 5.52    Types: Cigarettes  . Smokeless tobacco: Never Used  . Tobacco comment: 12/30/2013 "down to less than 1 pack cigarettes/wk"  Substance and Sexual Activity  . Alcohol use: No  . Drug use: No  . Sexual activity: Not Currently  Lifestyle  . Physical activity:    Days per week: Not on file    Minutes per session: Not on file  . Stress: Not on file  Relationships  . Social connections:    Talks on phone: Not on file    Gets together: Not on file    Attends religious service: Not on file    Active member of club or organization: Not on file    Attends meetings of clubs or organizations: Not on file    Relationship status: Not on file  . Intimate partner violence:    Fear of current or ex partner: Not on file    Emotionally abused: Not on file    Physically abused: Not on file    Forced sexual activity: Not on file  Other Topics Concern  . Not on file  Social History Narrative  . Not on file   Family History  Problem Relation Age of Onset  . Leukemia Mother   . Pancreatic cancer Father   . Diabetes Maternal Grandmother   . Sudden death Maternal Grandfather   . Stroke Paternal Grandfather   . Rheum arthritis Sister   . Hypertension Son    ROS: All systems reviewed and negative except as per HPI.   Current Outpatient Medications  Medication Sig Dispense Refill  . acetaminophen (TYLENOL) 325 MG tablet Take 2 tablets (650 mg total) by mouth every 4 (four)  hours as needed for headache or mild pain.    Marland Kitchen aspirin EC 81 MG tablet Take 81 mg by mouth daily.    . carvedilol (COREG) 3.125 MG tablet Take 1 tablet (3.125 mg total) by mouth 2 (two) times daily with a meal. 60 tablet 5  . clopidogrel (PLAVIX)  75 MG tablet TAKE 1 TABLET DAILY (Patient taking differently: Take 75 mg by mouth daily. ) 90 tablet 0  . digoxin (LANOXIN) 0.125 MG tablet Take 1 tablet (0.125 mg total) by mouth daily. 90 tablet 3  . levothyroxine (SYNTHROID, LEVOTHROID) 25 MCG tablet Take 1 tablet (25 mcg total) by mouth daily before breakfast. 30 tablet 2  . rosuvastatin (CRESTOR) 40 MG tablet Take 1 tablet (40 mg total) by mouth daily. 90 tablet 3  . spironolactone (ALDACTONE) 25 MG tablet Take 1 tablet (25 mg total) by mouth at bedtime. 30 tablet 5  . torsemide (DEMADEX) 20 MG tablet Take 0.5 tablets (10 mg total) by mouth every other day. 30 tablet 5  . sacubitril-valsartan (ENTRESTO) 24-26 MG Take 1 tablet by mouth 2 (two) times daily. 60 tablet 6   No current facility-administered medications for this encounter.    BP 106/62 (Patient Position: Standing) Comment: MAP 79  Pulse 72   Ht 5\' 6"  (1.676 m)   Wt 56.7 kg (125 lb)   SpO2 100%   BMI 20.18 kg/m  General: NAD Neck: No JVD, no thyromegaly or thyroid nodule.  Lungs: Clear to auscultation bilaterally with normal respiratory effort. CV: Nondisplaced PMI.  Heart regular S1/S2, no S3/S4, 2/6 SEM RUSB.  No peripheral edema.  No carotid bruit.  Unable to palpate pedal pulses.  Abdomen: Soft, nontender, no hepatosplenomegaly, no distention.  Skin: Intact without lesions or rashes.  Neurologic: Alert and oriented x 3.  Psych: Normal affect. Extremities: No clubbing or cyanosis.  HEENT: Normal.   Assessment/Plan: 1.  Chronic systolic CHF: Ischemic cardiomyopathy.  Echo in 1/20 with EF 10-15%, moderate RV systolic dysfunction.  [place].  RHC in 1/20 with low cardiac index, 1.7. NYHA class III symptoms.  She is  not volume overloaded on exam.   - Continue Coreg 3.125 mg bid.  - Continue digoxin 0.125 daily, check digoxin level today.  - Stop ramipril, after 36 hrs start Entresto 24/26 bid. BMET in 10 days.  - Continue spironolactone 25 mg daily.  - With initiation of Entresto, she can decrease torsemide to 10 mg every other day.  - I will arrange for CPX.  - Given low output HF, we have been discussing LVAD.   I am concerned that she is nearing the point where she will need LVAD.  Limitations for LVAD include moderate-severe COPD and severe TR with moderate RV dysfunction. PAPi is 4, suggesting that RV function would probably be reasonable for LVAD.  However, she may need percutaneous RV support to get to LVAD.  Will continue workup and discussions with LVAD team.  She has seen Dr. Prescott Gum.  2. CAD: Significant CAD on 1/20 cath but no interventional targets.  No chest pain.  - Continue ASA 81 and Crestor 40 daily.  3. Moderate-severe AI: If she gets an LVAD, this may need to be addressed surgically.  4. Tricuspid regurgitation: Severe on 1/20 echo.   5. PAD: No claudication but bilateral ABIs around 0.6, signifying significant PAD.   - She has quit smoking.  - Continue statin and aspirin.  6. Carotid stenosis: Repeat carotid dopplers in 1/21.  7. COPD: She recently quit smoking.  Moderate-severe COPD on PFTs in 1/20.   Loralie Champagne 07/15/2018

## 2018-07-13 NOTE — Telephone Encounter (Signed)
Called patient with following instructions per Dr. Aundra Dubin:  1. Stop Ramipril. In 36 hrs start Entresto 24/26 mg twice daily. 2. Rx sent to local pharmacy for [facility], if any issues with cost, please call Jenna at [phone] for drug assistance. 3. You will need labs in 10 days to check your kidney function (BMET). 4. Your CPX is scheduled on Monday, Jan 27 at 11:00 am. You will receive a letter with detailed instructions for test. We will draw lab (BMET) same day.  Pt verbalized understanding of above.  Call back if any questions: [place] RN, [place] Coordinator [phone]

## 2018-07-16 ENCOUNTER — Other Ambulatory Visit (HOSPITAL_COMMUNITY): Payer: Self-pay | Admitting: Cardiology

## 2018-07-16 ENCOUNTER — Telehealth: Payer: Self-pay

## 2018-07-16 ENCOUNTER — Inpatient Hospital Stay (HOSPITAL_COMMUNITY): Payer: Medicare Other

## 2018-07-16 DIAGNOSIS — I5022 Chronic systolic (congestive) heart failure: Secondary | ICD-10-CM

## 2018-07-16 NOTE — Telephone Encounter (Signed)
Called to schedule lipid appt via dr berry raquel requests dates from 1/20-1/30 will try to call tomorrow

## 2018-07-17 ENCOUNTER — Other Ambulatory Visit (HOSPITAL_COMMUNITY): Payer: Self-pay | Admitting: *Deleted

## 2018-07-17 ENCOUNTER — Telehealth (HOSPITAL_COMMUNITY): Payer: Self-pay | Admitting: *Deleted

## 2018-07-17 DIAGNOSIS — Z01818 Encounter for other preprocedural examination: Secondary | ICD-10-CM

## 2018-07-17 NOTE — Telephone Encounter (Signed)
Spoke with Verdis Frederickson regarding need for ABG for surgical work up. She is scheduled at 10:30 in the PFT lab for ABG, 10:45 for labs in the HF clinic, and CPX at 11:00 on 07/23/2018. She verbalized understanding of this. Instructed her to call clinic if she has any follow up questions.  Emerson Monte RN [place] Coordinator  Office: [phone]  24/7 Pager: [phone]

## 2018-07-20 ENCOUNTER — Other Ambulatory Visit (HOSPITAL_COMMUNITY): Payer: Self-pay | Admitting: Unknown Physician Specialty

## 2018-07-20 DIAGNOSIS — I5022 Chronic systolic (congestive) heart failure: Secondary | ICD-10-CM

## 2018-07-23 ENCOUNTER — Other Ambulatory Visit (HOSPITAL_COMMUNITY): Payer: Self-pay | Admitting: *Deleted

## 2018-07-23 ENCOUNTER — Ambulatory Visit (HOSPITAL_COMMUNITY)
Admission: RE | Admit: 2018-07-23 | Discharge: 2018-07-23 | Disposition: A | Discharge status: Home / Self Care | Payer: Medicare Other | Source: Ambulatory Visit | Attending: Cardiology | Admitting: Cardiology

## 2018-07-23 ENCOUNTER — Other Ambulatory Visit (HOSPITAL_COMMUNITY): Payer: Medicare Other

## 2018-07-23 ENCOUNTER — Other Ambulatory Visit (HOSPITAL_COMMUNITY): Payer: Self-pay | Admitting: Cardiology

## 2018-07-23 ENCOUNTER — Ambulatory Visit (HOSPITAL_COMMUNITY): Payer: Medicare Other

## 2018-07-23 DIAGNOSIS — I5022 Chronic systolic (congestive) heart failure: Secondary | ICD-10-CM | POA: Diagnosis not present

## 2018-07-23 LAB — BASIC METABOLIC PANEL
Anion gap: 9 (ref 5–15)
BUN: 18 mg/dL (ref 8–23)
CO2: 26 mmol/L (ref 22–32)
Calcium: 9.6 mg/dL (ref 8.9–10.3)
Chloride: 101 mmol/L (ref 98–111)
Creatinine, Ser: 1.19 mg/dL — ABNORMAL HIGH (ref 0.44–1.00)
GFR calc Af Amer: 53 mL/min — ABNORMAL LOW (ref >60)
GFR calc non Af Amer: 46 mL/min — ABNORMAL LOW (ref >60)
Glucose, Bld: 118 mg/dL — ABNORMAL HIGH (ref 70–99)
Potassium: 4.3 mmol/L (ref 3.5–5.1)
Sodium: 136 mmol/L (ref 135–145)

## 2018-07-24 ENCOUNTER — Encounter: Payer: Self-pay | Admitting: Nurse Practitioner

## 2018-07-25 ENCOUNTER — Ambulatory Visit: Payer: Medicare Other | Admitting: Cardiovascular Disease

## 2018-07-26 ENCOUNTER — Other Ambulatory Visit: Payer: Self-pay | Admitting: Cardiovascular Disease

## 2018-07-26 ENCOUNTER — Encounter: Payer: Self-pay | Admitting: Pharmacist Clinician (PhC)/ Clinical Pharmacy Specialist

## 2018-07-26 ENCOUNTER — Ambulatory Visit (INDEPENDENT_AMBULATORY_CARE_PROVIDER_SITE_OTHER): Payer: Medicare Other | Admitting: Pharmacist Clinician (PhC)/ Clinical Pharmacy Specialist

## 2018-07-26 DIAGNOSIS — I2510 Atherosclerotic heart disease of native coronary artery without angina pectoris: Secondary | ICD-10-CM | POA: Diagnosis not present

## 2018-07-26 DIAGNOSIS — E7800 Pure hypercholesterolemia, unspecified: Secondary | ICD-10-CM

## 2018-07-26 MED ORDER — EZETIMIBE 10 MG PO TABS: 10.0000 mg | ORAL_TABLET | Freq: Every day | ORAL | 3 refills | Status: DC

## 2018-07-26 NOTE — Patient Instructions (Addendum)
It was nice to meet you today.    Start ezetimibe 10 mg once daily.  Take this with all your other medications.   We will repeat your blood work to check cholesterol in about 8-10 weeks.  We will call you at that time to remind you.   Ezetimibe Tablets What is this medicine? EZETIMIBE (ez ET i mibe) blocks the absorption of cholesterol from the stomach. It can help lower blood cholesterol for patients who are at risk of getting heart disease or a stroke. It is only for patients whose cholesterol level is not controlled by diet. This medicine may be used for other purposes; ask your health care provider or pharmacist if you have questions. COMMON BRAND NAME(S): Zetia What should I tell my health care provider before I take this medicine? They need to know if you have any of these conditions: -liver disease -an unusual or allergic reaction to ezetimibe, medicines, foods, dyes, or preservatives -pregnant or trying to get pregnant -breast-feeding How should I use this medicine? Take this medicine by mouth with a glass of water. Follow the directions on the prescription label. This medicine can be taken with or without food. Take your doses at regular intervals. Do not take your medicine more often than directed. Talk to your pediatrician regarding the use of this medicine in children. Special care may be needed. Overdosage: If you think you have taken too much of this medicine contact a poison control center or emergency room at once. NOTE: This medicine is only for you. Do not share this medicine with others. What if I miss a dose? If you miss a dose, take it as soon as you can. If it is almost time for your next dose, take only that dose. Do not take double or extra doses. What may interact with this medicine? Do not take this medicine with any of the following medications: -fenofibrate -gemfibrozil This medicine may also interact with the following  medications: -antacids -cyclosporine -herbal medicines like red yeast rice -other medicines to lower cholesterol or triglycerides This list may not describe all possible interactions. Give your health care provider a list of all the medicines, herbs, non-prescription drugs, or dietary supplements you use. Also tell them if you smoke, drink alcohol, or use illegal drugs. Some items may interact with your medicine. What should I watch for while using this medicine? Visit your doctor or health care professional for regular checks on your progress. You will need to have your cholesterol levels checked. If you are also taking some other cholesterol medicines, you will also need to have tests to make sure your liver is working properly. Tell your doctor or health care professional if you get any unexplained muscle pain, tenderness, or weakness, especially if you also have a fever and tiredness. You need to follow a low-cholesterol, low-fat diet while you are taking this medicine. This will decrease your risk of getting heart and blood vessel disease. Exercising and avoiding alcohol and smoking can also help. Ask your doctor or dietician for advice. What side effects may I notice from receiving this medicine? Side effects that you should report to your doctor or health care professional as soon as possible: -allergic reactions like skin rash, itching or hives, swelling of the face, lips, or tongue -dark yellow or brown urine -unusually weak or tired -yellowing of the skin or eyes Side effects that usually do not require medical attention (report to your doctor or health care professional if they continue or  are bothersome): -diarrhea -dizziness -headache -stomach upset or pain This list may not describe all possible side effects. Call your doctor for medical advice about side effects. You may report side effects to FDA at 1-800-FDA-1088. Where should I keep my medicine? Keep out of the reach of  children. Store at room temperature between 15 and 30 degrees C (59 and 86 degrees F). Protect from moisture. Keep container tightly closed. Throw away any unused medicine after the expiration date. NOTE: This sheet is a summary. It may not cover all possible information. If you have questions about this medicine, talk to your doctor, pharmacist, or health care provider.  2019 Elsevier/Gold Standard (2011-12-19 15:39:09)

## 2018-07-26 NOTE — Progress Notes (Signed)
07/26/2018 Andrea Hubbard 1946-05-17 [id number]   HPI:  Andrea Hubbard is a 73 y.o. female patient of Dr Gwenlyn Found, who presents today for a lipid clinic evaluation.  In addition to hyperlipidemia, her medical history is significant for CAD (s/p MI,  with > 90% stenosis in D1, occluded RCA with collaterals), PAD (stents x 3 in 2015),  hyperlipidemia, carotid stenosis and CHF (10-15% in Dec 2019)   Today she is here for potential PCSK-9 start.  She has complaints of feeling tired all the time and is rather stressed about all the recent testing (heart cath, carotid and lower extremity dopplers).  She is somewhat emotional and concerned about the possibility of intervention.  Believes that invasive procedures will almost always lead to further problems down the road.  She hopes to move to [place] soon, feels like [place] is too small of a town.   Current Medications: rosuvastatin 40 mg qd - states compliance  Cholesterol Goals: LDL < 70  Family history:   Father had hypertension, most of his life, had long term ocular damage; died at 69  PGF died at 39 from stroke, paternal family history of early death due to strokes  Mother had CABG at 65, developed multiple TIA's , died from leukemia   Sister with debilitating RA since early 20's,   Brother with Agent Orange exposure  Two sons, both with hypertension, both well controlled. Younger son had mass on one kidney   Diet: combinatoin of home/restaurant, very low sodium, eats plenty of fruit.    Exercise:  No regular exercise  Labs:  05/2018:  TC 182, TG 99, HLD 43, LDL 119  Current Outpatient Medications  Medication Sig Dispense Refill  . acetaminophen (TYLENOL) 325 MG tablet Take 2 tablets (650 mg total) by mouth every 4 (four) hours as needed for headache or mild pain.    Marland Kitchen aspirin EC 81 MG tablet Take 81 mg by mouth daily.    . carvedilol (COREG) 3.125 MG tablet Take 1 tablet (3.125 mg total) by mouth 2 (two) times daily with a meal. 60 tablet  5  . clopidogrel (PLAVIX) 75 MG tablet Take 1 tablet (75 mg total) by mouth daily. 90 tablet 0  . digoxin (LANOXIN) 0.125 MG tablet Take 1 tablet (0.125 mg total) by mouth daily. 90 tablet 3  . ezetimibe (ZETIA) 10 MG tablet Take 1 tablet (10 mg total) by mouth daily. 90 tablet 3  . levothyroxine (SYNTHROID, LEVOTHROID) 25 MCG tablet Take 1 tablet (25 mcg total) by mouth daily before breakfast. 30 tablet 2  . rosuvastatin (CRESTOR) 40 MG tablet Take 1 tablet (40 mg total) by mouth daily. 90 tablet 3  . sacubitril-valsartan (ENTRESTO) 24-26 MG Take 1 tablet by mouth 2 (two) times daily. 60 tablet 6  . spironolactone (ALDACTONE) 25 MG tablet Take 1 tablet (25 mg total) by mouth at bedtime. 30 tablet 5  . torsemide (DEMADEX) 20 MG tablet Take 0.5 tablets (10 mg total) by mouth every other day. 30 tablet 5   No current facility-administered medications for this visit.     Allergies  Allergen Reactions  . Morphine And Related Other (See Comments) and Hypertension    Raises blood pressure, makes the patient "hyper"    Past Medical History:  Diagnosis Date  . Automatic implantable cardioverter-defibrillator in situ 2011   [employer]   . Carotid artery disease ([place])   . Coronary artery disease   . Dysrhythmia   . History of nuclear stress test  04/26/2012   mod-severe perfusion defect in basal inferior septal and basal inf, mid inferoseptal, mid inferio & apical inferior - consistent with infarct/scar; low risk   . Hypercholesteremia   . Hypertension   . ICD (implantable cardioverter-defibrillator) in place 03/08/2016  . PVD (peripheral vascular disease) ([place])   . Silent myocardial infarction ([place]) 2009; 2010  . Tobacco abuse     There were no vitals taken for this visit.   Hyperlipidemia Patient with hyperlipidemia and complex history of ASCVD.  She is quite hesitant about starting injections, so will instead start ezetimibe 10 mg daily.  She will continue rosuvastatin  40 mg.  She states compliance, with an LDL at 119, although two years ago her LDL was al low as 79.  We will repeat lipid labs in 8 weeks before.  At that time she will consider PCSK-9 inhibitor if not at goal.    Tommy Medal PharmD CPP [place]

## 2018-07-26 NOTE — Assessment & Plan Note (Signed)
Patient with hyperlipidemia and complex history of ASCVD.  She is quite hesitant about starting injections, so will instead start ezetimibe 10 mg daily.  She will continue rosuvastatin 40 mg.  She states compliance, with an LDL at 119, although two years ago her LDL was al low as 79.  We will repeat lipid labs in 8 weeks before.  At that time she will consider PCSK-9 inhibitor if not at goal.

## 2018-07-30 DIAGNOSIS — I739 Peripheral vascular disease, unspecified: Secondary | ICD-10-CM

## 2018-07-30 DIAGNOSIS — I5022 Chronic systolic (congestive) heart failure: Secondary | ICD-10-CM

## 2018-07-30 DIAGNOSIS — Z09 Encounter for follow-up examination after completed treatment for conditions other than malignant neoplasm: Secondary | ICD-10-CM

## 2018-07-31 ENCOUNTER — Ambulatory Visit (INDEPENDENT_AMBULATORY_CARE_PROVIDER_SITE_OTHER): Payer: Medicare Other | Admitting: Cardiovascular Disease

## 2018-07-31 ENCOUNTER — Other Ambulatory Visit (HOSPITAL_COMMUNITY): Payer: Self-pay | Admitting: Unknown Physician Specialty

## 2018-07-31 ENCOUNTER — Other Ambulatory Visit: Payer: Medicare Other

## 2018-07-31 ENCOUNTER — Encounter: Payer: Self-pay | Admitting: Cardiovascular Disease

## 2018-07-31 DIAGNOSIS — I2510 Atherosclerotic heart disease of native coronary artery without angina pectoris: Secondary | ICD-10-CM | POA: Diagnosis not present

## 2018-07-31 DIAGNOSIS — I5022 Chronic systolic (congestive) heart failure: Secondary | ICD-10-CM | POA: Diagnosis not present

## 2018-07-31 NOTE — Patient Instructions (Signed)
Medication Instructions:  NONE If you need a refill on your cardiac medications before your next appointment, please call your pharmacy.   Lab work: NONE If you have labs (blood work) drawn today and your tests are completely normal, you will receive your results only by: Marland Kitchen MyChart Message (if you have MyChart) OR . A paper copy in the mail If you have any lab test that is abnormal or we need to change your treatment, we will call you to review the results.  Testing/Procedures: NONE  Follow-Up: At [facility], you and your health needs are our priority.  As part of our continuing mission to provide you with exceptional heart care, we have created designated Provider Care Teams.  These Care Teams include your primary Cardiologist (physician) and Advanced Practice Providers (APPs -  Physician Assistants and Nurse Practitioners) who all work together to provide you with the care you need, when you need it. . You will need a follow up appointment in 3 months.  Please call our office 2 months in advance to schedule this appointment.  You may see Dr. Gwenlyn Found or one of the following Advanced Practice Providers on your designated Care Team:   . Kerin Ransom, [place] . Almyra Deforest, PA-C . Fabian Sharp, PA-C . Jory Sims, DNP . Rosaria Ferries, PA-C . Roby Lofts, PA-C . Sande Rives, PA-C

## 2018-07-31 NOTE — Assessment & Plan Note (Signed)
Ms. Pfefferkorn returns today for post hospital follow-up.  I last saw her in the office 06/21/2018.  She has a history of chronic systolic heart failure with an EF in the 25 to 30% range.  She is had an ICD placed prophylactically by Dr. [name].  She was admitted to [facility] in heart failure a week after she saw me and was diuresed.  Her EF was 10 to 20%.  She underwent right left heart cath by Dr. Aundra Dubin revealing an occluded RCA, left-to-right collaterals and otherwise branch vessel disease.  She did see Dr. [name] in consultation for consideration of LVAD therapy for destination.  She is lost 9 pounds.  She is clinically improved.  She wishes to have a second opinion at [facility] with regards to the risks and benefits of LVAD therapy.

## 2018-07-31 NOTE — Progress Notes (Signed)
07/31/2018 Andrea Hubbard   07/28/1945  [id number]  Primary Physician Andrea Chard, MD Primary Cardiologist: Andrea Harp MD Andrea Hubbard, [place]  HPI:  Andrea Hubbard is a 73 y.o.  thin appearing married [place] American female, mother of 3 children who recently moved from [place] 2 months ago to retire in [place]. Her daughter went to [employer]. She is a retired Mudlogger for [employer].I last saw her in the office  06/21/2018.Marland Kitchen   Her cardiac risk factor profile is positive for a 20+ pack-year history of tobacco abuse, currently smoking one-half pack per week. She has treated hypertension and hyperlipidemia. There is no family history for heart disease. She apparently had a "silent myocardial infarction" in the winter of 2010. Her cardiovascular evaluation began because of symptoms of claudication. She ultimately underwent right carotid endarterectomy for asymptomatic carotid disease, a PCI stent of an artery in her heart, and placement of a prophylactic ICD as well as stenting of her right lower extremity. She denies chest pain or shortness of breath . She does continue to smoke.  She has been followed up with Dr. Sallyanne Hubbard for her ICD.  Her last Dopplers performed of her carotids performed 03/16/2016 reveal a widely patent right internal carotid artery endarterectomy site with moderate disease in her left internal carotid artery that has remained stable. She has had some progression of disease in her lower extremities with gradual onset of right lower extremity claudication.I performed angiography on her 12/30/13 stenting her left common iliac, right common and external iliac arteries. I also performed chocolate balloon angioplasty of her mid left SFA resulting. Moderate left lower dissection which L to the left alone. Her symptoms of claudication improved somewhat after this. I have not seen her back since that time although Dr. Sallyanne Hubbard continues to follow  her ICD. She has stopped smoking 12 months ago. Her recent lipid profile was excellent.   Since I saw her 1 month ago she was admitted in early January with heart failure.  She was diuresed.  She underwent right and left heart cath by Dr. Algernon Hubbard revealing occluded dominant RCA with left-to-right collaterals and branch vessel disease.  She is not a revascularization candidate.  She underwent consultation with Dr. Darcey Hubbard who thought she was a candidate for LVAD therapy plus or minus RVAD therapy as destination therapy .    Current Meds  Medication Sig  . acetaminophen (TYLENOL) 325 MG tablet Take 2 tablets (650 mg total) by mouth every 4 (four) hours as needed for headache or mild pain.  Marland Kitchen aspirin EC 81 MG tablet Take 81 mg by mouth daily.  . carvedilol (COREG) 3.125 MG tablet Take 1 tablet (3.125 mg total) by mouth 2 (two) times daily with a meal.  . clopidogrel (PLAVIX) 75 MG tablet Take 1 tablet (75 mg total) by mouth daily.  . digoxin (LANOXIN) 0.125 MG tablet Take 1 tablet (0.125 mg total) by mouth daily.  Marland Kitchen ezetimibe (ZETIA) 10 MG tablet Take 1 tablet (10 mg total) by mouth daily.  Marland Kitchen levothyroxine (SYNTHROID, LEVOTHROID) 25 MCG tablet Take 1 tablet (25 mcg total) by mouth daily before breakfast.  . rosuvastatin (CRESTOR) 40 MG tablet Take 1 tablet (40 mg total) by mouth daily.  . sacubitril-valsartan (ENTRESTO) 24-26 MG Take 1 tablet by mouth 2 (two) times daily.  Marland Kitchen spironolactone (ALDACTONE) 25 MG tablet Take 1 tablet (25 mg total) by mouth at bedtime.  . torsemide (DEMADEX) 20 MG tablet  Take 0.5 tablets (10 mg total) by mouth every other day.     Allergies  Allergen Reactions  . Morphine And Related Other (See Comments) and Hypertension    Raises blood pressure, makes the patient "hyper"    Social History   Socioeconomic History  . Marital status: Married    Spouse name: Not on file  . Number of children: 3  . Years of education: coolege   . Highest education level: Not on  file  Occupational History  . 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 Financial resource strain: Not on file  . Food insecurity:    Worry: Not on file    Inability: Not on file  . Transportation needs:    Medical: Not on file    Non-medical: Not on file  Tobacco Use  . Smoking status: Current Some Day Smoker    Packs/day: 0.12    Years: 46.00    Pack years: 5.52    Types: Cigarettes  . Smokeless tobacco: Never Used  . Tobacco comment: 12/30/2013 "down to less than 1 pack cigarettes/wk"  Substance and Sexual Activity  . Alcohol use: No  . Drug use: No  . Sexual activity: Not Currently  Lifestyle  . Physical activity:    Days per week: Not on file    Minutes per session: Not on file  . Stress: Not on file  Relationships  . Social connections:    Talks on phone: Not on file    Gets together: Not on file    Attends religious service: Not on file    Active member of club or organization: Not on file    Attends meetings of clubs or organizations: Not on file    Relationship status: Not on file  . Intimate partner violence:    Fear of current or ex partner: Not on file    Emotionally abused: Not on file    Physically abused: Not on file    Forced sexual activity: Not on file  Other Topics Concern  . Not on file  Social History Narrative  . Not on file     Review of Systems: General: negative for chills, fever, night sweats or weight changes.  Cardiovascular: negative for chest pain, dyspnea on exertion, edema, orthopnea, palpitations, paroxysmal nocturnal dyspnea or shortness of breath Dermatological: negative for rash Respiratory: negative for cough or wheezing Urologic: negative for hematuria Abdominal: negative for nausea, vomiting, diarrhea, bright red blood per rectum, melena, or hematemesis Neurologic: negative for visual changes, syncope, or dizziness All other systems reviewed and are otherwise negative except as noted above.    Blood  pressure 90/70, pulse 72, height 5\' 6"  (1.676 m), weight 127 lb (57.6 kg).  General appearance: alert and no distress Neck: no adenopathy, no carotid bruit, no JVD, supple, symmetrical, trachea midline and thyroid not enlarged, symmetric, no tenderness/mass/nodules Lungs: clear to auscultation bilaterally Heart: regular rate and rhythm, S1, S2 normal, no murmur, click, rub or gallop Extremities: extremities normal, atraumatic, no cyanosis or edema Pulses: 2+ and symmetric Skin: Skin color, texture, turgor normal. No rashes or lesions Neurologic: Alert and oriented X 3, normal strength and tone. Normal symmetric reflexes. Normal coordination and gait  EKG not performed today  ASSESSMENT AND PLAN:   Chronic systolic congestive heart failure ([place]) Ms. Urbanowicz returns today for post hospital follow-up.  I last saw her in the office 06/21/2018.  She has a history of chronic systolic heart failure with an  EF in the 25 to 30% range.  She is had an ICD placed prophylactically by Dr. Sallyanne Hubbard.  She was admitted to [facility] in heart failure a week after she saw me and was diuresed.  Her EF was 10 to 20%.  She underwent right left heart cath by Dr. Aundra Dubin revealing an occluded RCA, left-to-right collaterals and otherwise branch vessel disease.  She did see Dr. Darcey Hubbard in consultation for consideration of LVAD therapy for destination.  She is lost 9 pounds.  She is clinically improved.  She wishes to have a second opinion at [facility] with regards to the risks and benefits of LVAD therapy.      Andrea Harp MD FACP,FACC,FAHA, [facility] 07/31/2018 9:48 AM

## 2018-08-02 ENCOUNTER — Other Ambulatory Visit (HOSPITAL_COMMUNITY): Payer: Self-pay | Admitting: *Deleted

## 2018-08-02 ENCOUNTER — Ambulatory Visit (HOSPITAL_BASED_OUTPATIENT_CLINIC_OR_DEPARTMENT_OTHER)
Admission: RE | Admit: 2018-08-02 | Discharge: 2018-08-02 | Disposition: A | Discharge status: Home / Self Care | Payer: Medicare Other | Source: Ambulatory Visit | Attending: Internal Medicine | Admitting: Internal Medicine

## 2018-08-02 ENCOUNTER — Ambulatory Visit (HOSPITAL_COMMUNITY)
Admission: RE | Admit: 2018-08-02 | Discharge: 2018-08-02 | Disposition: A | Discharge status: Home / Self Care | Payer: Medicare Other | Source: Ambulatory Visit | Attending: Internal Medicine | Admitting: Internal Medicine

## 2018-08-02 ENCOUNTER — Other Ambulatory Visit: Payer: Self-pay

## 2018-08-02 VITALS — BP 110/47 | HR 57 | Ht 66.0 in | Wt 122.8 lb

## 2018-08-02 DIAGNOSIS — I110 Hypertensive heart disease with heart failure: Secondary | ICD-10-CM | POA: Insufficient documentation

## 2018-08-02 DIAGNOSIS — E785 Hyperlipidemia, unspecified: Secondary | ICD-10-CM | POA: Diagnosis not present

## 2018-08-02 DIAGNOSIS — Z7982 Long term (current) use of aspirin: Secondary | ICD-10-CM | POA: Diagnosis not present

## 2018-08-02 DIAGNOSIS — E039 Hypothyroidism, unspecified: Secondary | ICD-10-CM | POA: Diagnosis not present

## 2018-08-02 DIAGNOSIS — J449 Chronic obstructive pulmonary disease, unspecified: Secondary | ICD-10-CM | POA: Insufficient documentation

## 2018-08-02 DIAGNOSIS — Z7901 Long term (current) use of anticoagulants: Secondary | ICD-10-CM | POA: Insufficient documentation

## 2018-08-02 DIAGNOSIS — I2510 Atherosclerotic heart disease of native coronary artery without angina pectoris: Secondary | ICD-10-CM

## 2018-08-02 DIAGNOSIS — I5022 Chronic systolic (congestive) heart failure: Secondary | ICD-10-CM

## 2018-08-02 DIAGNOSIS — Z79899 Other long term (current) drug therapy: Secondary | ICD-10-CM | POA: Diagnosis not present

## 2018-08-02 DIAGNOSIS — Z87891 Personal history of nicotine dependence: Secondary | ICD-10-CM | POA: Insufficient documentation

## 2018-08-02 DIAGNOSIS — I5023 Acute on chronic systolic (congestive) heart failure: Secondary | ICD-10-CM

## 2018-08-02 DIAGNOSIS — I5043 Acute on chronic combined systolic (congestive) and diastolic (congestive) heart failure: Secondary | ICD-10-CM

## 2018-08-02 DIAGNOSIS — I739 Peripheral vascular disease, unspecified: Secondary | ICD-10-CM

## 2018-08-02 DIAGNOSIS — I255 Ischemic cardiomyopathy: Secondary | ICD-10-CM | POA: Insufficient documentation

## 2018-08-02 DIAGNOSIS — I080 Rheumatic disorders of both mitral and aortic valves: Secondary | ICD-10-CM | POA: Diagnosis not present

## 2018-08-02 LAB — BASIC METABOLIC PANEL
Anion gap: 13 (ref 5–15)
BUN: 20 mg/dL (ref 8–23)
CO2: 25 mmol/L (ref 22–32)
Calcium: 9.4 mg/dL (ref 8.9–10.3)
Chloride: 100 mmol/L (ref 98–111)
Creatinine, Ser: 1.13 mg/dL — ABNORMAL HIGH (ref 0.44–1.00)
GFR calc Af Amer: 57 mL/min — ABNORMAL LOW (ref >60)
GFR calc non Af Amer: 49 mL/min — ABNORMAL LOW (ref >60)
Glucose, Bld: 121 mg/dL — ABNORMAL HIGH (ref 70–99)
POTASSIUM: 4.9 mmol/L (ref 3.5–5.1)
Sodium: 138 mmol/L (ref 135–145)

## 2018-08-02 LAB — DIGOXIN LEVEL: DIGOXIN LVL: 1.1 ng/mL (ref 0.8–2.0)

## 2018-08-02 MED ORDER — TORSEMIDE 20 MG PO TABS: ORAL_TABLET | ORAL | 5 refills | Status: DC

## 2018-08-02 MED ORDER — SACUBITRIL-VALSARTAN 49-51 MG PO TABS: 1.0000 | ORAL_TABLET | Freq: Two times a day (BID) | ORAL | 6 refills | Status: DC

## 2018-08-02 NOTE — Progress Notes (Signed)
  Echocardiogram 2D Echocardiogram has been performed.  Andrea Hubbard 08/02/2018, 10:05 AM

## 2018-08-02 NOTE — Patient Instructions (Addendum)
  1. Increase Entresto to 49/51 twice daily. 2. Decrease Torsemide 10 mg every 3 days. 3. Appointment with Raquel Sarna (Social Worker) on Monday 08/13/18 at 10:00 am with labs to follow. Check in at HF registration. 4. Will send referral to Cardiac Rehab 5. Return to HF clinic in 3 weeks.

## 2018-08-02 NOTE — Progress Notes (Addendum)
Patient presents for 2 week f/u with Dr. Aundra Dubin  in [facility] today with husband.  Pt had complete echo prior to this appointment; Dr. Aundra Dubin read/reviewed prior to seeing patient.     Pt reports she has been feeling better overall, but still expresses concern over continued weight loss. She feels "emaciated" and doesn't understand why she is taking Torsemide 10 mg every other day with such significant weight loss. She continues to report having poor appetite; denies early satiety, but instead says she "just doesn't want anything to eat". She is drinking milkshakes and Ensure daily.   She does still report she "gets tired" during day and says she takes "naps about every 5 hours".  She reports improvement in SOB and can "climb stairs without any problems".  Lipid clinic appt on 07/26/18; pt hesitant to start injections, so ezetimibe 10 mg daily added along with her Crestor 40 mg daily.   Pt saw Dr. Gwenlyn Found on 07/31/18 and expressed wish to have second option at [facility] with regards to LVAD therapy. Pt nor husband expressed this at today's visit with Dr. Aundra Dubin.  EKG obtained today and reviewed by Dr. Aundra Dubin.  Pt met with VAD patient today for questions and answers. She was tearful during visit, covering eyes and repeating "I can't do this".  Pt canceled appt with Raquel Sarna, LCSW for social evaluation today; she expressed she has "taken all I can take today". Rescheduled same day she needs f/u BMP due to medication changes today.    Vital Signs: HR: 57 BP: 110/47 (82)  SPO2: 100% RA   Weight: 122.8  lb Last wt: 125 lb   Symptom YES NO DETAILS  Angina  x Activity:  Claudication  x How Far:  Syncope  x When:  Stroke  x   Orthopnea  x How many pillows:  1 pillows  PND  x How often:  CPAP  x How many hours:  Pedal Edema  x   Abdominal Fullness  x   Nausea / Vomit  x Fair appetite; denies early satiety  Diaphoresis  x When:  Shortness of Breath  x Activity:   Palpitations  x When:   ICD shock  x   Hospitalizations  x   Emergency Room  x   Other MD   x  Lipid clinic 07/26/18; Dr. Gwenlyn Found 07/31/18  Activity Household chores; naps every 5 hours  Fluid 2 liters/day  Diet No added salt    Device:  [employer] single lead Therapies: on 185 bpm Pacing: VVI 40 Last check: 06/07/16   Patient Instructions:  1. Increase Entresto to 49/51 twice daily. 2. Decrease Torsemide 10 mg every 3 days. 3. Appointment with Raquel Sarna (Social Worker) on Monday 08/13/18 at 10:00 am with labs to follow. Check in at HF registration. 4. Will send referral to Cardiac Rehab 5. Return to HF clinic in 3 weeks.   Zada Girt, RN VAD Coordinator    Office: [phone] 24/7 Emergency VAD Pager: [phone]  PCP: Glendale Chard, MD HF Cardiology: Dr. Aundra Dubin  73 yo with history of CAD, COPD, PAD, and ischemic cardiomyopathy with chronic systolic CHF presents for followup of CHF and CAD.  She was seen by Dr. Gwenlyn Found in the past and developed increased dyspnea in 12/19. Echo in 12/19 showed EF 10-15% with moderately reduced RV systolic function and moderate-severe AI.  She was then admitted in 1/20 with increased dyspnea and peripheral edema.  She was diuresed and right/left heart cath was done.  Cardiac index was low at 1.7.  She had significant CAD but there were no interventional targets.  Medications were adjusted and she was discharged home.   CPX in 1/20 was worrisome for poor short-term prognosis, showed moderate-severe HF limitation.  Repeat echo was done today and reviewed, showing EF 20-25%, inferior akinesis and septal severe hypokinesis, normal RV size with moderately decreased systolic function, moderate AI, mild TR. ABIs in 1/20 showed moderate PAD bilaterally.    She was seen in lipid and clinic and refused to start PCSK-9 inhibitor.  She was willing to add Zetia.    She has stayed off cigarettes.  Seems to be doing better symptomatically.  Weight is down 3 lbs. No claudication or  feet/leg ulcers.  She now has minimal dyspnea with exertion.  She is walking outside some.  Fatigues after walking about a block.  No chest pain.  No orthopnea/PND.  No lightheadedness/falls. She is concerned about her ongoing weight loss.    Labs (12/19): LDL 119 Labs (1/20): digoxin 0.8, K 4.7, creatinine 1.19  ECG (personally reviewed): NSR, 1st degree AVB, old inferior MI, old anterior MI, QRS 102 msec.   PMH: 1. COPD: Quit smoking 1/20.   - PFTs (1/20): FVC 63%, FEV1 54%, TLC 177%, DLCO 33%. Moderately severe emphysema.  2. Carotid stenosis: s/p right CEA in 2010.  - Carotid dopplers (5/95): 63-87% LICA stenosis, s/p R CEA.  3. PAD: s/p PCI to right CIA and left CIA in 2015.  Known right SFA occlusion.  - ABIs 0.63 right, 0.69 left (1/20).  4. CAD: MI with PCI in 2010 in [place].  - 1/20 LHC: 40% pLAD, 95% stenosis proximal small-moderate D1, small-moderate D2 70% ostial stenosis, small D3 99% proximal stenosis, 60-70% ostial stenosis small-moderate OM1, patent stent in OM2, occluded RCA at ostium with left => right collaterals.  5. Chronic systolic CHF: Ischemic cardiomyopathy.  [place].  - Echo (12/19): EF 10-15% with mild LV dilation, diffuse hypokinesis, moderate-severe AI, mild MR, PASP 61 mmHg, moderately reduced RV systolic function, severe TR.  - RHC (1/20): mean RA 6, PA 40/16 mean 25, mean PCWP 12, CI 1.7, PVR 4.6 WU. PAPi 4.  - CPX (1/20): peak VO2 14.4, VE/VCO2 slope 57, RER 1.03 => moderate-severe HF limitation with concern for poor short term prognosis.  - Echo (2/20): EF 20-25%, inferior akinesis and septal severe hypokinesis, normal RV size with moderately decreased systolic function, moderate AI, mild TR. 6. HTN 7. Hypothyroidism 8. Hyperlipidemia  Social History   Socioeconomic History  . Marital status: Married    Spouse name: Not on file  . Number of children: 3  . Years of education: coolege   . Highest education level: Not on file   Occupational History  . 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 Financial resource strain: Not on file  . Food insecurity:    Worry: Not on file    Inability: Not on file  . Transportation needs:    Medical: Not on file    Non-medical: Not on file  Tobacco Use  . Smoking status: Current Some Day Smoker    Packs/day: 0.12    Years: 46.00    Pack years: 5.52    Types: Cigarettes  . Smokeless tobacco: Never Used  . Tobacco comment: 12/30/2013 "down to less than 1 pack cigarettes/wk"  Substance and Sexual Activity  . Alcohol use: No  . Drug use: No  . Sexual activity: Not  Currently  Lifestyle  . Physical activity:    Days per week: Not on file    Minutes per session: Not on file  . Stress: Not on file  Relationships  . Social connections:    Talks on phone: Not on file    Gets together: Not on file    Attends religious service: Not on file    Active member of club or organization: Not on file    Attends meetings of clubs or organizations: Not on file    Relationship status: Not on file  . Intimate partner violence:    Fear of current or ex partner: Not on file    Emotionally abused: Not on file    Physically abused: Not on file    Forced sexual activity: Not on file  Other Topics Concern  . Not on file  Social History Narrative  . Not on file   Family History  Problem Relation Age of Onset  . Leukemia Mother   . Pancreatic cancer Father   . Diabetes Maternal Grandmother   . Sudden death Maternal Grandfather   . Stroke Paternal Grandfather   . Rheum arthritis Sister   . Hypertension Son    ROS: All systems reviewed and negative except as per HPI.   Current Outpatient Medications  Medication Sig Dispense Refill  . acetaminophen (TYLENOL) 325 MG tablet Take 2 tablets (650 mg total) by mouth every 4 (four) hours as needed for headache or mild pain.    Marland Kitchen aspirin EC 81 MG tablet Take 81 mg by mouth daily.    . carvedilol (COREG) 3.125 MG  tablet Take 1 tablet (3.125 mg total) by mouth 2 (two) times daily with a meal. 60 tablet 5  . clopidogrel (PLAVIX) 75 MG tablet Take 1 tablet (75 mg total) by mouth daily. 90 tablet 0  . digoxin (LANOXIN) 0.125 MG tablet Take 1 tablet (0.125 mg total) by mouth daily. 90 tablet 3  . ezetimibe (ZETIA) 10 MG tablet Take 1 tablet (10 mg total) by mouth daily. 90 tablet 3  . levothyroxine (SYNTHROID, LEVOTHROID) 25 MCG tablet Take 1 tablet (25 mcg total) by mouth daily before breakfast. 30 tablet 2  . rosuvastatin (CRESTOR) 40 MG tablet Take 1 tablet (40 mg total) by mouth daily. 90 tablet 3  . spironolactone (ALDACTONE) 25 MG tablet Take 1 tablet (25 mg total) by mouth at bedtime. 30 tablet 5  . torsemide (DEMADEX) 20 MG tablet Take one every third day 30 tablet 5  . sacubitril-valsartan (ENTRESTO) 49-51 MG Take 1 tablet by mouth 2 (two) times daily. 60 tablet 6   No current facility-administered medications for this encounter.    BP (!) 110/47 Comment: MAP 87  Pulse (!) 57   Ht 5' 6"  (1.676 m)   Wt 55.7 kg (122 lb 12.8 oz)   SpO2 100%   BMI 19.82 kg/m  General: NAD Neck: No JVD, no thyromegaly or thyroid nodule.  Lungs: Clear to auscultation bilaterally with normal respiratory effort. CV: Nondisplaced PMI.  Heart regular S1/S2, no S3/S4, 1/6 SEM RUSB.  No peripheral edema.  No carotid bruit.  Unable to palpate pedal pulses.  Abdomen: Soft, nontender, no hepatosplenomegaly, no distention.  Skin: Intact without lesions or rashes.  Neurologic: Alert and oriented x 3.  Psych: Normal affect. Extremities: No clubbing or cyanosis.  HEENT: Normal.   Assessment/Plan: 1.  Chronic systolic CHF: Ischemic cardiomyopathy.  Echo in 1/20 with EF 10-15%, moderate RV systolic dysfunction.  Repeat echo  in 2/20 with EF 20-25%, normal RV size with moderate systolic dysfunction.  [place] ICD, narrow QRS so not candidate for CRT.  CPX in 1/20 with moderate-severe HF limitation and concern for poor  short-term prognosis.  RHC in 1/20 with low cardiac index, 1.7. NYHA class III symptoms but she actually seems to have improved some since last appointment.  She is not volume overloaded on exam.   - Continue Coreg 3.125 mg bid.  - Continue digoxin 0.125 daily, check digoxin level today.  - Increase Entresto to 49/51 bid. BMET today and in 10 days.  - Continue spironolactone 25 mg daily.  - With increase in [place], she can decrease torsemide to 10 mg every 3rd day.  - Given low output HF by RHC and suggestion of poor short-term prognosis on CPX, we have been discussing LVAD.   I am concerned that she is nearing the point where she will need LVAD.  Limitations for LVAD include moderate-severe COPD and moderate RV dysfunction. PAPi on RHC was 4, suggesting that RV function would probably be reasonable for LVAD.  However, she may need percutaneous RV support to get to LVAD.  She has seen Dr. Prescott Gum. She is understandably concerned/anxious about her trajectory.  We had a long discussion today and determined that I will follow her closely for now and make sure LVAD workup is complete.  She understands that she will likely need LVAD but wants to see if she continues to make symptomatic improvement over the next few weeks.  Also, she and her husband have been considering a move to [place], but plan to stay in [place] for the time being as we determine what path to take in terms of her CHF.  If she does move to [place], I will set her up with Dr. Pilar Plate at [facility].  - I will refer for cardiac rehab.  2. CAD: Significant CAD on 1/20 cath but no interventional targets.  No chest pain.  - Continue ASA 81, Crestor 40 daily, and Zetia 10 mg daily.  3. Aortic insufficiency: Moderate-severe on 1/20 echo, appeared moderate on 2/20 echo.  If she gets an LVAD, this may need to be addressed surgically.  4. Tricuspid regurgitation: Severe on 1/20 echo but with effective diuresis, appeared only mild on 2/20  echo.   5. PAD: No claudication but bilateral ABIs around 0.6, signifying significant PAD.   - She has quit smoking.  - Continue statin and aspirin.  6. Carotid stenosis: Repeat carotid dopplers in 1/21.  7. COPD: She recently quit smoking.  Moderate-severe COPD on PFTs in 1/20.  8. Hyperlipidemia: Followed in lipid clinic.  Repatha was recommended but she did not want to take this medication.  Instead, she is now on Crestor + Zetia.   Close followup, I will see her back in 3 wks.   Loralie Champagne 08/02/2018

## 2018-08-03 ENCOUNTER — Telehealth (HOSPITAL_COMMUNITY): Payer: Self-pay | Admitting: Unknown Physician Specialty

## 2018-08-03 MED ORDER — DIGOXIN 125 MCG PO TABS: 0.0625 mg | ORAL_TABLET | Freq: Every day | ORAL | 3 refills | Status: AC

## 2018-08-03 NOTE — Telephone Encounter (Signed)
Called pt and left VM instructing her to decrease Digoxin 0.0.625 mg daily per Dr. Aundra Dubin this is based on her lab results yesterday. We will draw a repeat test at her next visit.  Tanda Rockers RN, BSN VAD Coordinator 24/7 Pager [phone]

## 2018-08-10 ENCOUNTER — Other Ambulatory Visit (HOSPITAL_COMMUNITY): Payer: Self-pay | Admitting: *Deleted

## 2018-08-10 DIAGNOSIS — I5022 Chronic systolic (congestive) heart failure: Secondary | ICD-10-CM

## 2018-08-10 DIAGNOSIS — I5023 Acute on chronic systolic (congestive) heart failure: Secondary | ICD-10-CM

## 2018-08-13 ENCOUNTER — Ambulatory Visit (HOSPITAL_COMMUNITY)
Admission: RE | Admit: 2018-08-13 | Discharge: 2018-08-13 | Disposition: A | Discharge status: Home / Self Care | Payer: Medicare Other | Source: Ambulatory Visit | Attending: Internal Medicine | Admitting: Internal Medicine

## 2018-08-13 ENCOUNTER — Telehealth (HOSPITAL_COMMUNITY): Payer: Self-pay | Admitting: Unknown Physician Specialty

## 2018-08-13 DIAGNOSIS — I5022 Chronic systolic (congestive) heart failure: Secondary | ICD-10-CM

## 2018-08-13 LAB — BASIC METABOLIC PANEL
Anion gap: 9 (ref 5–15)
BUN: 16 mg/dL (ref 8–23)
CALCIUM: 9.4 mg/dL (ref 8.9–10.3)
CO2: 25 mmol/L (ref 22–32)
Chloride: 103 mmol/L (ref 98–111)
Creatinine, Ser: 0.94 mg/dL (ref 0.44–1.00)
GFR calc Af Amer: >60 mL/min (ref >60)
GFR calc non Af Amer: >60 mL/min (ref >60)
Glucose, Bld: 115 mg/dL — ABNORMAL HIGH (ref 70–99)
Potassium: 4.6 mmol/L (ref 3.5–5.1)
Sodium: 137 mmol/L (ref 135–145)

## 2018-08-13 NOTE — Telephone Encounter (Signed)
Pt informed of normal lab results.  Tanda Rockers RN, BSN VAD Coordinator 24/7 Pager [phone]

## 2018-08-13 NOTE — Addendum Note (Signed)
Encounter addended by: Louann Liv, LCSW on: 08/13/2018 12:05 PM  Actions taken: Clinical Note Signed

## 2018-08-13 NOTE — Progress Notes (Signed)
CSW met with patient and husband Octavia Bruckner in the clinic to complete an LVAD Assessment. Patient and husband shared recent purchase of a home in [place], [place] to be closer to their daughter and son in law. Husband shared the move to [place] was in the works when patient became ill and started the conversations around LVAD implant. Patient states that her husband would be primary caregiver although if she was to be implanted here they have no other family or support system in place. She reports her daughter and son in law would be the back up caregivers although it would be challenging if patient were to be implanted here in [place]. Patient and husband shared struggle of whether to pursue implant here in [place] or to be referred to [place]. Patient shared she feels she will have more family support and stability if consider implant in [place].  Patient grateful for the support and conversation about LVAD implantation. CSW will discuss further with VAD team and MD for next steps. Raquel Sarna, [place], [place]

## 2018-08-17 ENCOUNTER — Encounter: Payer: Medicare Other | Admitting: Cardiovascular Disease

## 2018-08-27 ENCOUNTER — Other Ambulatory Visit: Payer: Self-pay | Admitting: Pharmacist Clinician (PhC)/ Clinical Pharmacy Specialist

## 2018-08-27 DIAGNOSIS — E7800 Pure hypercholesterolemia, unspecified: Secondary | ICD-10-CM

## 2018-08-29 ENCOUNTER — Other Ambulatory Visit (HOSPITAL_COMMUNITY): Payer: Self-pay | Admitting: *Deleted

## 2018-08-29 DIAGNOSIS — I5022 Chronic systolic (congestive) heart failure: Secondary | ICD-10-CM

## 2018-08-30 ENCOUNTER — Encounter (HOSPITAL_COMMUNITY): Payer: Self-pay

## 2018-08-30 ENCOUNTER — Ambulatory Visit (HOSPITAL_COMMUNITY)
Admission: RE | Admit: 2018-08-30 | Discharge: 2018-08-30 | Disposition: A | Discharge status: Home / Self Care | Payer: Medicare Other | Source: Ambulatory Visit | Attending: Internal Medicine | Admitting: Internal Medicine

## 2018-08-30 VITALS — BP 121/62 | HR 66 | Wt 123.2 lb

## 2018-08-30 DIAGNOSIS — E785 Hyperlipidemia, unspecified: Secondary | ICD-10-CM | POA: Diagnosis not present

## 2018-08-30 DIAGNOSIS — F17210 Nicotine dependence, cigarettes, uncomplicated: Secondary | ICD-10-CM | POA: Insufficient documentation

## 2018-08-30 DIAGNOSIS — I5022 Chronic systolic (congestive) heart failure: Secondary | ICD-10-CM | POA: Insufficient documentation

## 2018-08-30 DIAGNOSIS — J449 Chronic obstructive pulmonary disease, unspecified: Secondary | ICD-10-CM | POA: Diagnosis not present

## 2018-08-30 DIAGNOSIS — I110 Hypertensive heart disease with heart failure: Secondary | ICD-10-CM | POA: Diagnosis not present

## 2018-08-30 DIAGNOSIS — Z95811 Presence of heart assist device: Secondary | ICD-10-CM | POA: Insufficient documentation

## 2018-08-30 DIAGNOSIS — I2510 Atherosclerotic heart disease of native coronary artery without angina pectoris: Secondary | ICD-10-CM | POA: Insufficient documentation

## 2018-08-30 DIAGNOSIS — E039 Hypothyroidism, unspecified: Secondary | ICD-10-CM | POA: Diagnosis not present

## 2018-08-30 DIAGNOSIS — Z79899 Other long term (current) drug therapy: Secondary | ICD-10-CM | POA: Diagnosis not present

## 2018-08-30 DIAGNOSIS — Z7982 Long term (current) use of aspirin: Secondary | ICD-10-CM | POA: Diagnosis not present

## 2018-08-30 DIAGNOSIS — Z7901 Long term (current) use of anticoagulants: Secondary | ICD-10-CM | POA: Insufficient documentation

## 2018-08-30 DIAGNOSIS — I255 Ischemic cardiomyopathy: Secondary | ICD-10-CM | POA: Diagnosis not present

## 2018-08-30 LAB — CBC
HCT: 44.2 % (ref 36.0–46.0)
Hemoglobin: 13.7 g/dL (ref 12.0–15.0)
MCH: 26.5 pg (ref 26.0–34.0)
MCHC: 31 g/dL (ref 30.0–36.0)
MCV: 85.5 fL (ref 80.0–100.0)
Platelets: 210 10*3/uL (ref 150–400)
RBC: 5.17 MIL/uL — ABNORMAL HIGH (ref 3.87–5.11)
RDW: 15.2 % (ref 11.5–15.5)
WBC: 8.2 10*3/uL (ref 4.0–10.5)
nRBC: 0 % (ref 0.0–0.2)

## 2018-08-30 LAB — BASIC METABOLIC PANEL
Anion gap: 9 (ref 5–15)
BUN: 20 mg/dL (ref 8–23)
CO2: 22 mmol/L (ref 22–32)
Calcium: 9.5 mg/dL (ref 8.9–10.3)
Chloride: 105 mmol/L (ref 98–111)
Creatinine, Ser: 0.99 mg/dL (ref 0.44–1.00)
GFR calc Af Amer: >60 mL/min (ref >60)
GFR, EST NON AFRICAN AMERICAN: 57 mL/min — AB (ref >60)
GLUCOSE: 117 mg/dL — AB (ref 70–99)
Potassium: 4.9 mmol/L (ref 3.5–5.1)
Sodium: 136 mmol/L (ref 135–145)

## 2018-08-30 LAB — DIGOXIN LEVEL: Digoxin Level: 0.6 ng/mL — ABNORMAL LOW (ref 0.8–2.0)

## 2018-08-30 MED ORDER — CARVEDILOL 6.25 MG PO TABS: 6.2500 mg | ORAL_TABLET | Freq: Two times a day (BID) | ORAL | 3 refills | Status: DC

## 2018-08-30 NOTE — Progress Notes (Addendum)
Patient presents for 3 week f/u with Dr. Aundra Dubin  in [facility] today with husband.  Pt reports she has been feeling much better, denies any heart failure symptoms. Reports she has been packing/cleaning house getting ready to sale. Has been going up and down stairs, and is requiring less rest periods daily.   She reports orthostatic dizziness a few times first thing in am when she gets OOB. She is drinking > 2 liters/fluid daily and taking Torsemide 10 mg every third day. Per Dr. Aundra Dubin, will stop Torsemide.   Pt reports she got a call from [facility] asking for insurance info. Will fax copy of insurance card.  Vital Signs: HR: 66  BP: 121/62 (83)  SPO2:  98% RA   Weight: 123.2 lbs Last wt: 122.8 lb   Symptom YES NO DETAILS  Angina  x Activity:  Claudication  x How Far:  Syncope  x When:  Stroke  x   Orthopnea  x How many pillows:  1 pillow  PND  x How often:  CPAP  x How many hours:  Pedal Edema  x   Abdominal Fullness  x   Nausea / Vomit  x Fair appetite; denies early satiety  Diaphoresis  x When:  Shortness of Breath  x Activity:   Palpitations  x When:  ICD shock  x   Hospitalizations  x   Emergency Room  x   Other MD   x   Activity increasing activities - cleaning/packing house to move  Fluid > 2 liters/day  Diet No added salt    Device:  [employer] single lead Therapies: on 185 bpm Pacing: VVI 40 Last check: 06/07/16   Patient Instructions:  1. Stop Torsemdie 2. Increase Coreg to 6.25 mg twice daily 3 Return to see Dr. Aundra Dubin in one month.    Zada Girt, RN VAD Coordinator    Office: [phone] 24/7 Emergency VAD Pager: [phone]  PCP: Glendale Chard, MD HF Cardiology: Dr. Aundra Dubin  73 yo with history of CAD, COPD, PAD, and ischemic cardiomyopathy with chronic systolic CHF presents for followup of CHF and CAD.  She was seen by Dr. Gwenlyn Found in the past and developed increased dyspnea in 12/19. Echo in 12/19 showed EF 10-15%  with moderately reduced RV systolic function and moderate-severe AI.  She was then admitted in 1/20 with increased dyspnea and peripheral edema.  She was diuresed and right/left heart cath was done.  Cardiac index was low at 1.7.  She had significant CAD but there were no interventional targets.  Medications were adjusted and she was discharged home.   CPX in 1/20 was worrisome for poor short-term prognosis, showed moderate-severe HF limitation.  Repeat echo was done today and reviewed, showing EF 20-25%, inferior akinesis and septal severe hypokinesis, normal RV size with moderately decreased systolic function, moderate AI, mild TR. ABIs in 1/20 showed moderate PAD bilaterally.    She was seen in lipid and clinic and refused to start PCSK-9 inhibitor.  She was willing to add Zetia.    She is no longer smoking.   She continues to make improvement symptomatically.  At this point, no exertional dyspnea with her usual activies. She is able to go up and down stairs without problems.  She has been cleaning up her house recently getting ready to sell it, and has been doing a fair amount of strenuous activity.  She will tire at times, but can rest and then start again.  Occasional  lightheadedness when she first stands, especially in the morning.  No severe lightheadedness or falls.  She will be moving to [place] soon. Weight is stable.    Labs (12/19): LDL 119 Labs (1/20): digoxin 0.8, K 4.7, creatinine 1.19 Labs (2/20): K 4.6, creatinine 0.94  PMH: 1. COPD: Quit smoking 1/20.   - PFTs (1/20): FVC 63%, FEV1 54%, TLC 177%, DLCO 33%. Moderately severe emphysema.  2. Carotid stenosis: s/p right CEA in 2010.  - Carotid dopplers (2/95): 28-41% LICA stenosis, s/p R CEA.  3. PAD: s/p PCI to right CIA and left CIA in 2015.  Known right SFA occlusion.  - ABIs 0.63 right, 0.69 left (1/20).  4. CAD: MI with PCI in 2010 in [place].  - 1/20 LHC: 40% pLAD, 95% stenosis proximal small-moderate D1,  small-moderate D2 70% ostial stenosis, small D3 99% proximal stenosis, 60-70% ostial stenosis small-moderate OM1, patent stent in OM2, occluded RCA at ostium with left => right collaterals.  5. Chronic systolic CHF: Ischemic cardiomyopathy.  [place].  - Echo (12/19): EF 10-15% with mild LV dilation, diffuse hypokinesis, moderate-severe AI, mild MR, PASP 61 mmHg, moderately reduced RV systolic function, severe TR.  - RHC (1/20): mean RA 6, PA 40/16 mean 25, mean PCWP 12, CI 1.7, PVR 4.6 WU. PAPi 4.  - CPX (1/20): peak VO2 14.4, VE/VCO2 slope 57, RER 1.03 => moderate-severe HF limitation with concern for poor short term prognosis.  - Echo (2/20): EF 20-25%, inferior akinesis and septal severe hypokinesis, normal RV size with moderately decreased systolic function, moderate AI, mild TR. 6. HTN 7. Hypothyroidism 8. Hyperlipidemia  Social History   Socioeconomic History  . Marital status: Married    Spouse name: Not on file  . Number of children: 3  . Years of education: coolege   . Highest education level: Not on file  Occupational History  . 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 Financial resource strain: Not on file  . Food insecurity:    Worry: Not on file    Inability: Not on file  . Transportation needs:    Medical: Not on file    Non-medical: Not on file  Tobacco Use  . Smoking status: Current Some Day Smoker    Packs/day: 0.12    Years: 46.00    Pack years: 5.52    Types: Cigarettes  . Smokeless tobacco: Never Used  . Tobacco comment: 12/30/2013 "down to less than 1 pack cigarettes/wk"  Substance and Sexual Activity  . Alcohol use: No  . Drug use: No  . Sexual activity: Not Currently  Lifestyle  . Physical activity:    Days per week: Not on file    Minutes per session: Not on file  . Stress: Not on file  Relationships  . Social connections:    Talks on phone: Not on file    Gets together: Not on file    Attends religious  service: Not on file    Active member of club or organization: Not on file    Attends meetings of clubs or organizations: Not on file    Relationship status: Not on file  . Intimate partner violence:    Fear of current or ex partner: Not on file    Emotionally abused: Not on file    Physically abused: Not on file    Forced sexual activity: Not on file  Other Topics Concern  . Not on file  Social History Narrative  .  Not on file   Family History  Problem Relation Age of Onset  . Leukemia Mother   . Pancreatic cancer Father   . Diabetes Maternal Grandmother   . Sudden death Maternal Grandfather   . Stroke Paternal Grandfather   . Rheum arthritis Sister   . Hypertension Son    ROS: All systems reviewed and negative except as per HPI.   Current Outpatient Medications  Medication Sig Dispense Refill  . acetaminophen (TYLENOL) 325 MG tablet Take 2 tablets (650 mg total) by mouth every 4 (four) hours as needed for headache or mild pain.    Mar[place] Kitchen aspirin EC 81 MG tablet Take 81 mg by mouth daily.    . clopidogrel (PLAVIX) 75 MG tablet Take 1 tablet (75 mg total) by mouth daily. 90 tablet 0  . digoxin (LANOXIN) 0.125 MG tablet Take 0.5 tablets (0.0625 mg total) by mouth daily. 90 tablet 3  . ezetimibe (ZETIA) 10 MG tablet Take 1 tablet (10 mg total) by mouth daily. 90 tablet 3  . levothyroxine (SYNTHROID, LEVOTHROID) 25 MCG tablet Take 1 tablet (25 mcg total) by mouth daily before breakfast. 30 tablet 2  . rosuvastatin (CRESTOR) 40 MG tablet Take 1 tablet (40 mg total) by mouth daily. 90 tablet 3  . sacubitril-valsartan (ENTRESTO) 49-51 MG Take 1 tablet by mouth 2 (two) times daily. 60 tablet 6  . spironolactone (ALDACTONE) 25 MG tablet Take 1 tablet (25 mg total) by mouth at bedtime. 30 tablet 5  . carvedilol (COREG) 6.25 MG tablet Take 1 tablet (6.25 mg total) by mouth 2 (two) times daily with a meal. 180 tablet 3   No current facility-administered medications for this encounter.    BP  121/62 Comment: MAP 83  Pulse 66   Wt 55.9 kg (123 lb 3.2 oz)   SpO2 98%   BMI 19.89 kg/m  General: NAD Neck: No JVD, no thyromegaly or thyroid nodule.  Lungs: Clear to auscultation bilaterally with normal respiratory effort. CV: Nondisplaced PMI.  Heart regular S1/S2, no S3/S4, 1/6 SEM RUSB.  No peripheral edema.  No carotid bruit.  Unable to palpate pedal pulses but feet are warm.  Abdomen: Soft, nontender, no hepatosplenomegaly, no distention.  Skin: Intact without lesions or rashes.  Neurologic: Alert and oriented x 3.  Psych: Normal affect. Extremities: No clubbing or cyanosis.  HEENT: Normal.   Assessment/Plan: 1.  Chronic systolic CHF: Ischemic cardiomyopathy.  Echo in 1/20 with EF 10-15%, moderate RV systolic dysfunction.  Repeat echo in 2/20 with EF 20-25%, normal RV size with moderate systolic dysfunction.  [place] ICD, narrow QRS so not candidate for CRT.  CPX in 1/20 with moderate-severe HF limitation and concern for poor short-term prognosis.  RHC in 1/20 with low cardiac index, 1.7. She has been doing much better symptomatically recently, now NYHA class II.  She is not volume overloaded.  She has clearly made progress over the last few weeks.    - Increase Coreg to 6.25 mg bid.  - Continue digoxin 0.0625 mg daily, check digoxin level today.  - Continue Entresto 49/51 bid. BMET today.   - Continue spironolactone 25 mg daily.  - I think she can stop torsemide at this point, has only been taking 10 mg every 3rd day.  Use prn.  - Given low output HF by RHC and suggestion of poor short-term prognosis on CPX, we have been discussing LVAD.  Limitations for LVAD include moderate-severe COPD and moderate RV dysfunction. PAPi on RHC was 4,  suggesting that RV function would probably be reasonable for LVAD.  However, she may need percutaneous RV support to get to LVAD.  She has seen Dr. Prescott Gum. She has actually been doing considerably better recently and symptoms are now NYHA  class II.  I do not think we have to rush to LVAD at this point.  Physiologically, she seems younger than her 77 years and expresses interest in evaluation for transplantation.  She is going to be moving to [place] soon.  I am going to put her in touch with Dr. Artemio Aly at [facility] clinic for transplant evaluation and further followup after she moves to [place].  - We are awaiting cardiac rehab for her.   2. CAD: Significant CAD on 1/20 cath but no interventional targets.  No chest pain.  - Continue ASA 81, Crestor 40 daily, and Zetia 10 mg daily.  3. Aortic insufficiency: Moderate-severe on 1/20 echo, appeared moderate on 2/20 echo.  If she gets an LVAD, this may need to be addressed surgically.  4. Tricuspid regurgitation: Severe on 1/20 echo but with effective diuresis, appeared only mild on 2/20 echo.   5. PAD: No claudication but bilateral ABIs around 0.6, signifying significant PAD.   - She has quit smoking.  - Continue statin and aspirin.  6. Carotid stenosis: Repeat carotid dopplers in 1/21.  7. COPD: She recently quit smoking.  Moderate-severe COPD on PFTs in 1/20.  8. Hyperlipidemia: Followed in lipid clinic.  Repatha was recommended but she did not want to take this medication.  Instead, she is now on Crestor + Zetia.   I will see her back in 1 month.  When she moves to [place], I will make sure she has followup with Dr. Pilar Plate at [facility].    Loralie Champagne 08/30/2018

## 2018-08-30 NOTE — Patient Instructions (Addendum)
1. Stop Torsemdie 2. Increase Coreg to 6.25 mg twice daily 3 Return to see Dr. Aundra Dubin in one month.

## 2018-09-15 ENCOUNTER — Other Ambulatory Visit: Payer: Self-pay | Admitting: Nurse Practitioner

## 2018-09-15 DIAGNOSIS — E039 Hypothyroidism, unspecified: Secondary | ICD-10-CM

## 2018-09-27 ENCOUNTER — Other Ambulatory Visit (HOSPITAL_COMMUNITY): Payer: Self-pay | Admitting: *Deleted

## 2018-10-17 ENCOUNTER — Encounter (HOSPITAL_COMMUNITY): Payer: Medicare Other | Admitting: Cardiology

## 2018-10-17 ENCOUNTER — Other Ambulatory Visit: Payer: Self-pay | Admitting: Cardiology

## 2018-10-17 ENCOUNTER — Ambulatory Visit: Payer: Medicare Other

## 2018-10-17 ENCOUNTER — Ambulatory Visit: Payer: Medicare Other | Admitting: Nurse Practitioner

## 2018-10-23 ENCOUNTER — Other Ambulatory Visit: Payer: Self-pay

## 2018-10-23 ENCOUNTER — Ambulatory Visit (HOSPITAL_COMMUNITY)
Admission: RE | Admit: 2018-10-23 | Discharge: 2018-10-23 | Disposition: A | Discharge status: Home / Self Care | Payer: Medicare Other | Source: Ambulatory Visit | Attending: Cardiology | Admitting: Cardiology

## 2018-10-23 DIAGNOSIS — I739 Peripheral vascular disease, unspecified: Secondary | ICD-10-CM | POA: Diagnosis not present

## 2018-10-23 DIAGNOSIS — G629 Polyneuropathy, unspecified: Secondary | ICD-10-CM

## 2018-10-23 DIAGNOSIS — I5022 Chronic systolic (congestive) heart failure: Secondary | ICD-10-CM

## 2018-10-23 MED ORDER — SACUBITRIL-VALSARTAN 97-103 MG PO TABS: 1.0000 | ORAL_TABLET | Freq: Two times a day (BID) | ORAL | 3 refills | Status: DC

## 2018-10-23 NOTE — Progress Notes (Signed)
Heart Failure TeleHealth Note  Due to national recommendations of social distancing due to [place] 19, Audio/video telehealth visit is felt to be most appropriate for this patient at this time.  See MyChart message from today for patient consent regarding telehealth for [facility]  Date:  10/23/2018   ID:  Andrea Hubbard, DOB [birth year], MRN [id number]  Location: Home  Provider location: [place] [facility] Failure Type of Visit: Established patient   PCP:  Glendale Chard, MD  Cardiologist:  Dr. Aundra Dubin  Chief Complaint: Fatigue   History of Present Illness: Andrea Hubbard is a 73 y.o. female who presents via audio/video conferencing for a telehealth visit today.     she denies symptoms worrisome for COVID 19.   Patient has a history of CAD, COPD, PAD, and ischemic cardiomyopathy with chronic systolic CHF.  She was seen by Dr. Gwenlyn Found in the past and developed increased dyspnea in 12/19. Echo in 12/19 showed EF 10-15% with moderately reduced RV systolic function and moderate-severe AI.  She was then admitted in 1/20 with increased dyspnea and peripheral edema.  She was diuresed and right/left heart cath was done.  Cardiac index was low at 1.7.  She had significant CAD but there were no interventional targets.  Medications were adjusted and she was discharged home.   CPX in 1/20 was worrisome for poor short-term prognosis, showed moderate-severe HF limitation.  Repeat echo was done today and reviewed, showing EF 20-25%, inferior akinesis and septal severe hypokinesis, normal RV size with moderately decreased systolic function, moderate AI, mild TR. ABIs in 1/20 showed moderate PAD bilaterally.    She was seen in lipid and clinic and refused to start PCSK-9 inhibitor.  She was willing to add Zetia.    She is no longer smoking.   She is doing well symptomatically still.  Had planned to move down to [place] but has not sold her house yet.  Weight has been stable.  She notes easy  fatiguability and some tingling in her hands and feet.  No significant exertional dyspnea at this point.  No orthopnea/PND.  No lightheadedness unless she stands up very quickly.  No claudication.   Labs (12/19): LDL 119 Labs (1/20): digoxin 0.8, K 4.7, creatinine 1.19 Labs (2/20): K 4.6, creatinine 0.94 Labs (3/20): K 4.9, creatinine 0.99, digoxin 0.6, hgb 13.7  PMH: 1. COPD: Quit smoking 1/20.   - PFTs (1/20): FVC 63%, FEV1 54%, TLC 177%, DLCO 33%. Moderately severe emphysema.  2. Carotid stenosis: s/p right CEA in 2010.  - Carotid dopplers (3/54): 65-68% LICA stenosis, s/p R CEA.  3. PAD: s/p PCI to right CIA and left CIA in 2015.  Known right SFA occlusion.  - ABIs 0.63 right, 0.69 left (1/20).  4. CAD: MI with PCI in 2010 in [place].  - 1/20 LHC: 40% pLAD, 95% stenosis proximal small-moderate D1, small-moderate D2 70% ostial stenosis, small D3 99% proximal stenosis, 60-70% ostial stenosis small-moderate OM1, patent stent in OM2, occluded RCA at ostium with left => right collaterals.  5. Chronic systolic CHF: Ischemic cardiomyopathy.  [place].  - Echo (12/19): EF 10-15% with mild LV dilation, diffuse hypokinesis, moderate-severe AI, mild MR, PASP 61 mmHg, moderately reduced RV systolic function, severe TR.  - RHC (1/20): mean RA 6, PA 40/16 mean 25, mean PCWP 12, CI 1.7, PVR 4.6 WU. PAPi 4.  - CPX (1/20): peak VO2 14.4, VE/VCO2 slope 57, RER 1.03 => moderate-severe HF limitation with concern for poor  short term prognosis.  - Echo (2/20): EF 20-25%, inferior akinesis and septal severe hypokinesis, normal RV size with moderately decreased systolic function, moderate AI, mild TR. 6. HTN 7. Hypothyroidism 8. Hyperlipidemia  Current Outpatient Medications  Medication Sig Dispense Refill   acetaminophen (TYLENOL) 325 MG tablet Take 2 tablets (650 mg total) by mouth every 4 (four) hours as needed for headache or mild pain.     aspirin EC 81 MG tablet Take 81 mg by mouth  daily.     carvedilol (COREG) 6.25 MG tablet Take 1 tablet (6.25 mg total) by mouth 2 (two) times daily with a meal. 180 tablet 3   clopidogrel (PLAVIX) 75 MG tablet TAKE 1 TABLET BY MOUTH EVERY DAY 90 tablet 3   digoxin (LANOXIN) 0.125 MG tablet Take 0.5 tablets (0.0625 mg total) by mouth daily. 90 tablet 3   ezetimibe (ZETIA) 10 MG tablet Take 1 tablet (10 mg total) by mouth daily. 90 tablet 3   levothyroxine (SYNTHROID, LEVOTHROID) 25 MCG tablet TAKE 1 TABLET (25 MCG TOTAL) BY MOUTH DAILY BEFORE BREAKFAST. 90 tablet 0   rosuvastatin (CRESTOR) 40 MG tablet Take 1 tablet (40 mg total) by mouth daily. 90 tablet 3   sacubitril-valsartan (ENTRESTO) 97-103 MG Take 1 tablet by mouth 2 (two) times daily. 60 tablet 3   spironolactone (ALDACTONE) 25 MG tablet Take 1 tablet (25 mg total) by mouth at bedtime. 30 tablet 5   No current facility-administered medications for this encounter.     Allergies:   Morphine and related   Social History:  The patient  reports that she has been smoking cigarettes. She has a 5.52 pack-year smoking history. She has never used smokeless tobacco. She reports that she does not drink alcohol or use drugs.   Family History:  The patient's family history includes Diabetes in her maternal grandmother; Hypertension in her son; Leukemia in her mother; Pancreatic cancer in her father; Rheum arthritis in her sister; Stroke in her paternal grandfather; Sudden death in her maternal grandfather.   ROS:  Please see the history of present illness.   All other systems are personally reviewed and negative.   Exam:  (Video/Tele Health Call; Exam is subjective and or/visual.) Neck: No JVD General:  Speaks in full sentences. No resp difficulty. Lungs: Normal respiratory effort with conversation.  Abdomen: Non-distended per patient report Extremities: Pt denies edema. Neuro: Alert & oriented x 3.   Recent Labs: 06/22/2018: ALT 31 06/27/2018: B Natriuretic Peptide 954.2; TSH  10.249 06/30/2018: Magnesium 2.1 08/30/2018: BUN 20; Creatinine, Ser 0.99; Hemoglobin 13.7; Platelets 210; Potassium 4.9; Sodium 136  Personally reviewed   Wt Readings from Last 3 Encounters:  08/30/18 55.9 kg (123 lb 3.2 oz)  08/02/18 55.7 kg (122 lb 12.8 oz)  07/31/18 57.6 kg (127 lb)      ASSESSMENT AND PLAN:  1.  Chronic systolic CHF: Ischemic cardiomyopathy.  Echo in 1/20 with EF 10-15%, moderate RV systolic dysfunction.  Repeat echo in 2/20 with EF 20-25%, normal RV size with moderate systolic dysfunction.  [place] ICD, narrow QRS so not candidate for CRT.  CPX in 1/20 with moderate-severe HF limitation and concern for poor short-term prognosis.  RHC in 1/20 with low cardiac index, 1.7. She has been doing much better symptomatically recently, now NYHA class II.  She is not volume overloaded by video exam and weight is stable.      - Continue Coreg 6.25 mg bid.  - Continue digoxin 0.0625 mg daily, check level  with next labs.  - Increase Entresto to 97/103 bid.  BMET in 10 days.    - Continue spironolactone 25 mg daily.  - She can stay off torsemide.  - Given low output HF by RHC and suggestion of poor short-term prognosis on CPX, we have been discussing LVAD.  Limitations for LVAD include moderate-severe COPD and moderate RV dysfunction. PAPi on RHC was 4, suggesting that RV function would probably be reasonable for LVAD.  She has seen Dr. Prescott Gum. She has actually been doing considerably better recently and symptoms are now NYHA class II.  I do not think we have to rush to LVAD at this point.  Physiologically, she seems younger than her 27 years and expresses interest in evaluation for transplantation.  She is going to be moving to [place] soon.  I am going to put her in touch with Dr. Artemio Aly at [facility] clinic for transplant evaluation and further followup after she moves to [place].  - We are awaiting cardiac rehab for her, will have to wait until coronavirus restrictions  are lifted.    2. CAD: Significant CAD on 1/20 cath but no interventional targets.  No chest pain.  - Continue ASA 81, Crestor 40 daily, and Zetia 10 mg daily.  3. Aortic insufficiency: Moderate-severe on 1/20 echo, appeared moderate on 2/20 echo.  If she gets an LVAD, this may need to be addressed surgically.  4. Tricuspid regurgitation: Severe on 1/20 echo but with effective diuresis, appeared only mild on 2/20 echo.   5. PAD: No claudication but bilateral ABIs around 0.6, signifying significant PAD.   - She has quit smoking.  - Continue statin and aspirin.  6. Carotid stenosis: Repeat carotid dopplers in 1/21.  7. COPD: She recently quit smoking.  Moderate-severe COPD on PFTs in 1/20.  8. Hyperlipidemia: Followed in lipid clinic.  Repatha was recommended but she did not want to take this medication.  Instead, she is now on Crestor + Zetia.  9. Hand/feet tingling: Possible neuropathy.  Check B12 level.   COVID screen The patient does not have any symptoms that suggest any further testing/ screening at this time.  Social distancing reinforced today.  Patient Risk: After full review of this patients clinical status, I feel that they are at moderate risk for cardiac decompensation at this time.  Relevant cardiac medications were reviewed at length with the patient today. The patient does not have concerns regarding their medications at this time.   Recommended follow-up:  6 wks  Today, I have spent 25 minutes with the patient with telehealth technology discussing the above issues .    Signed, Loralie Champagne, MD  10/23/2018  [facility] [address] [facility] and [place] [address] [phone] (office) [phone] (fax)

## 2018-10-23 NOTE — Patient Instructions (Addendum)
Increase Entresto to 97/103mg  twice daily.  Lab work this week. (Vitamin b12, digoxin level, and bmet)  Repeat labs (bmet) in 2 weeks.   Follow up visit in 6 weeks.

## 2018-10-24 ENCOUNTER — Ambulatory Visit (HOSPITAL_COMMUNITY)
Admission: RE | Admit: 2018-10-24 | Discharge: 2018-10-24 | Disposition: A | Discharge status: Home / Self Care | Payer: Medicare Other | Source: Ambulatory Visit | Attending: Cardiology | Admitting: Cardiology

## 2018-10-24 ENCOUNTER — Other Ambulatory Visit: Payer: Self-pay

## 2018-10-24 DIAGNOSIS — I5022 Chronic systolic (congestive) heart failure: Secondary | ICD-10-CM | POA: Diagnosis not present

## 2018-10-24 DIAGNOSIS — G629 Polyneuropathy, unspecified: Secondary | ICD-10-CM

## 2018-10-24 LAB — BASIC METABOLIC PANEL
Anion gap: 9 (ref 5–15)
BUN: 23 mg/dL (ref 8–23)
CO2: 25 mmol/L (ref 22–32)
Calcium: 9.7 mg/dL (ref 8.9–10.3)
Chloride: 104 mmol/L (ref 98–111)
Creatinine, Ser: 0.91 mg/dL (ref 0.44–1.00)
GFR calc Af Amer: >60 mL/min (ref >60)
GFR calc non Af Amer: >60 mL/min (ref >60)
Glucose, Bld: 98 mg/dL (ref 70–99)
Potassium: 4.5 mmol/L (ref 3.5–5.1)
Sodium: 138 mmol/L (ref 135–145)

## 2018-10-24 LAB — DIGOXIN LEVEL: Digoxin Level: 0.4 ng/mL — ABNORMAL LOW (ref 0.8–2.0)

## 2018-10-24 LAB — VITAMIN B12: Vitamin B-12: 484 pg/mL (ref 180–914)

## 2018-10-26 ENCOUNTER — Telehealth (HOSPITAL_COMMUNITY): Payer: Self-pay | Admitting: *Deleted

## 2018-10-26 MED ORDER — SACUBITRIL-VALSARTAN 49-51 MG PO TABS: 1.0000 | ORAL_TABLET | Freq: Two times a day (BID) | ORAL | 3 refills | Status: DC

## 2018-10-26 NOTE — Telephone Encounter (Signed)
Spoke with pt she is aware and agreeable with plan. Med updated in chart.

## 2018-10-26 NOTE — Telephone Encounter (Signed)
Pt called to report her bp was 62/41 this morning. She is asymptomatic but her bp has been running low since increasing her entresto to 97/103mg  twice daily.  Message sent to [place] for advice

## 2018-10-26 NOTE — Telephone Encounter (Signed)
Decrease Entresto back to 49/51 bid.

## 2018-11-06 ENCOUNTER — Ambulatory Visit (HOSPITAL_COMMUNITY)
Admission: RE | Admit: 2018-11-06 | Discharge: 2018-11-06 | Disposition: A | Discharge status: Home / Self Care | Payer: Medicare Other | Source: Ambulatory Visit | Attending: Cardiology | Admitting: Cardiology

## 2018-11-06 ENCOUNTER — Ambulatory Visit: Payer: Medicare Other | Admitting: Cardiovascular Disease

## 2018-11-06 ENCOUNTER — Other Ambulatory Visit: Payer: Self-pay

## 2018-11-06 DIAGNOSIS — I5022 Chronic systolic (congestive) heart failure: Secondary | ICD-10-CM | POA: Diagnosis not present

## 2018-11-06 LAB — BASIC METABOLIC PANEL
Anion gap: 8 (ref 5–15)
BUN: 19 mg/dL (ref 8–23)
CO2: 23 mmol/L (ref 22–32)
Calcium: 9.6 mg/dL (ref 8.9–10.3)
Chloride: 105 mmol/L (ref 98–111)
Creatinine, Ser: 1.17 mg/dL — ABNORMAL HIGH (ref 0.44–1.00)
GFR calc Af Amer: 54 mL/min — ABNORMAL LOW (ref >60)
GFR calc non Af Amer: 47 mL/min — ABNORMAL LOW (ref >60)
Glucose, Bld: 135 mg/dL — ABNORMAL HIGH (ref 70–99)
Potassium: 4.6 mmol/L (ref 3.5–5.1)
Sodium: 136 mmol/L (ref 135–145)

## 2018-11-07 ENCOUNTER — Ambulatory Visit: Payer: Medicare Other | Admitting: Nurse Practitioner

## 2018-11-07 ENCOUNTER — Encounter: Payer: Medicare Other | Admitting: Cardiovascular Disease

## 2018-11-07 ENCOUNTER — Ambulatory Visit: Payer: Medicare Other

## 2018-11-08 ENCOUNTER — Ambulatory Visit (INDEPENDENT_AMBULATORY_CARE_PROVIDER_SITE_OTHER): Payer: Medicare Other | Admitting: Cardiovascular Disease

## 2018-11-08 ENCOUNTER — Other Ambulatory Visit: Payer: Self-pay

## 2018-11-08 ENCOUNTER — Encounter

## 2018-11-08 ENCOUNTER — Encounter: Payer: Self-pay | Admitting: Cardiovascular Disease

## 2018-11-08 ENCOUNTER — Ambulatory Visit (INDEPENDENT_AMBULATORY_CARE_PROVIDER_SITE_OTHER): Payer: Medicare Other

## 2018-11-08 VITALS — BP 92/62 | HR 62 | Ht 66.0 in | Wt 122.0 lb

## 2018-11-08 VITALS — BP 117/55 | Temp 96.4°F | Ht 66.0 in | Wt 122.0 lb

## 2018-11-08 DIAGNOSIS — E7800 Pure hypercholesterolemia, unspecified: Secondary | ICD-10-CM | POA: Diagnosis not present

## 2018-11-08 DIAGNOSIS — I255 Ischemic cardiomyopathy: Secondary | ICD-10-CM | POA: Diagnosis not present

## 2018-11-08 DIAGNOSIS — I2510 Atherosclerotic heart disease of native coronary artery without angina pectoris: Secondary | ICD-10-CM | POA: Diagnosis not present

## 2018-11-08 DIAGNOSIS — I5022 Chronic systolic (congestive) heart failure: Secondary | ICD-10-CM | POA: Diagnosis not present

## 2018-11-08 DIAGNOSIS — I6523 Occlusion and stenosis of bilateral carotid arteries: Secondary | ICD-10-CM | POA: Diagnosis not present

## 2018-11-08 DIAGNOSIS — I739 Peripheral vascular disease, unspecified: Secondary | ICD-10-CM

## 2018-11-08 DIAGNOSIS — Z95810 Presence of automatic (implantable) cardiac defibrillator: Secondary | ICD-10-CM | POA: Diagnosis not present

## 2018-11-08 DIAGNOSIS — Z0000 Encounter for general adult medical examination without abnormal findings: Secondary | ICD-10-CM | POA: Diagnosis not present

## 2018-11-08 LAB — PACEMAKER DEVICE OBSERVATION

## 2018-11-08 NOTE — Progress Notes (Signed)
Subjective:   Andrea Hubbard is a 73 y.o. female who presents for Medicare Annual (Subsequent) preventive examination.  This visit type was conducted due to national recommendations for restrictions regarding the COVID-19 Pandemic (e.g. social distancing). This format is felt to be most appropriate for this patient at this time. All issues noted in this document were discussed and addressed. No physical exam was performed (except for noted visual exam findings with Video Visits). This patient, Andrea Hubbard, has given permission to perform this visit via telephone. Vital signs may be absent or patient reported.  Patient location: at home  Nurse location:  [place] office     Review of Systems:  n/a Cardiac Risk Factors include: advanced age (>41men, >70 women);hypertension     Objective:     Vitals: BP (!) 117/55 Comment: per patient  Temp (!) 96.4 F (35.8 C) Comment: per patient  Ht 5\' 6"  (1.676 m) Comment: per patient  Wt 122 lb (55.3 kg) Comment: per patient  BMI 19.69 kg/m   Body mass index is 19.69 kg/m.  Advanced Directives 11/08/2018 06/27/2018 06/27/2018 12/30/2013 06/11/2012  Does Patient Have a Medical Advance Directive? Yes No No Patient has advance directive, copy not in chart Patient does not have advance directive  Type of Advance Directive Living will - - - -  Does patient want to make changes to medical advance directive? - - - No change requested -  Copy of [place] in Chart? - - - Copy requested from other (Comment) -  Would patient like information on creating a medical advance directive? - Yes (Inpatient - patient requests chaplain consult to create a medical advance directive) No - Patient declined - -  Pre-existing out of facility DNR order (yellow form or pink MOST form) - - - No No    Tobacco Social History   Tobacco Use  Smoking Status Current Some Day Smoker  . Packs/day: 0.12  . Years: 46.00  . Pack years: 5.52  . Types:  Cigarettes  Smokeless Tobacco Never Used  Tobacco Comment   1 cigarette a week     Ready to quit: Not Answered Counseling given: Not Answered Comment: 1 cigarette a week   Clinical Intake:  Pre-visit preparation completed: Yes  Pain : No/denies pain Pain Score: 0-No pain     Nutritional Status: BMI of 19-24  Normal Nutritional Risks: None Diabetes: No  How often do you need to have someone help you when you read instructions, pamphlets, or other written materials from your doctor or pharmacy?: 1 - Never What is the last grade level you completed in school?: bachelor's degree  Interpreter Needed?: No  Information entered by :: NAllen LPN  Past Medical History:  Diagnosis Date  . Automatic implantable cardioverter-defibrillator in situ 2011   [employer]   . Carotid artery disease ([place])   . Coronary artery disease   . Dysrhythmia   . History of nuclear stress test 04/26/2012   mod-severe perfusion defect in basal inferior septal and basal inf, mid inferoseptal, mid inferio & apical inferior - consistent with infarct/scar; low risk   . Hypercholesteremia   . Hypertension   . ICD (implantable cardioverter-defibrillator) in place 03/08/2016  . PVD (peripheral vascular disease) ([place])   . Silent myocardial infarction ([place]) 2009; 2010  . Tobacco abuse    Past Surgical History:  Procedure Laterality Date  . ANGIOPLASTY / STENTING FEMORAL Right 2010  . CARDIAC DEFIBRILLATOR PLACEMENT  04/2010  . Carotid  Duplex  04/2012   R ECA - severe vessel narrowing with inc velocities 70-99% diameter reduction; L subclav - elevated velocities 50-69% diameter reduction; L vertebral - occlusive disease; L bulb/prox ICA - elevated velocities in prox & mid segments on ICA, 50-69% diameter reduction   . CESAREAN SECTION  1979  . COLONOSCOPY  06/11/2012   Procedure: COLONOSCOPY;  Surgeon: Juanita Craver, MD;  Location: [facility];  Service: Endoscopy;  Laterality: N/A;  .  ENDARTERECTOMY Right 2010  . ILIAC ARTERY STENT Bilateral 12/30/2013  . LOWER EXTREMITY ANGIOGRAM Bilateral 12/30/2013   Procedure: LOWER EXTREMITY ANGIOGRAM;  Surgeon: Lorretta Harp, MD;  Location: [facility] C[facility] LAB;  Service: Cardiovascular;  Laterality: Bilateral;  . Lower Extremity Arterial Doppler  04/2012   ABI 0.63 on R and 0.77 on L  . PERCUTANEOUS CORONARY STENT INTERVENTION (PCI-S)     in [place]  . RIGHT/LEFT HEART CATH AND CORONARY ANGIOGRAPHY N/A 06/29/2018   Procedure: RIGHT/LEFT HEART CATH AND CORONARY ANGIOGRAPHY;  Surgeon: Larey Dresser, MD;  Location: [facility] CV LAB;  Service: Cardiovascular;  Laterality: N/A;  . TRANSTHORACIC ECHOCARDIOGRAM  04/26/2012   EF 30-35%; imparied LV relaxation; mod post wall hypokinesis, mod-severe ant wall kypokinesis and severe septal hypokinesis; RV systolic function mod redcued, with RV systolic pressure elevated; mild AVR   Family History  Problem Relation Age of Onset  . Leukemia Mother   . Pancreatic cancer Father   . Diabetes Maternal Grandmother   . Sudden death Maternal Grandfather   . Stroke Paternal Grandfather   . Rheum arthritis Sister   . Hypertension Son    Social History   Socioeconomic History  . Marital status: Married    Spouse name: Not on file  . Number of children: 3  . Years of education: coolege   . Highest education level: Not on file  Occupational History  . 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 Financial resource strain: Not hard at all  . Food insecurity:    Worry: Never true    Inability: Never true  . Transportation needs:    Medical: No    Non-medical: No  Tobacco Use  . Smoking status: Current Some Day Smoker    Packs/day: 0.12    Years: 46.00    Pack years: 5.52    Types: Cigarettes  . Smokeless tobacco: Never Used  . Tobacco comment: 1 cigarette a week  Substance and Sexual Activity  . Alcohol use: No  . Drug use: No  . Sexual activity: Not Currently  Lifestyle   . Physical activity:    Days per week: 0 days    Minutes per session: 0 min  . Stress: Not at all  Relationships  . Social connections:    Talks on phone: Not on file    Gets together: Not on file    Attends religious service: Not on file    Active member of club or organization: Not on file    Attends meetings of clubs or organizations: Not on file    Relationship status: Not on file  Other Topics Concern  . Not on file  Social History Narrative  . Not on file    Outpatient Encounter Medications as of 11/08/2018  Medication Sig  . acetaminophen (TYLENOL) 325 MG tablet Take 2 tablets (650 mg total) by mouth every 4 (four) hours as needed for headache or mild pain.  Marland Kitchen aspirin EC 81 MG tablet Take 81 mg by mouth  daily.  . carvedilol (COREG) 6.25 MG tablet Take 1 tablet (6.25 mg total) by mouth 2 (two) times daily with a meal.  . clopidogrel (PLAVIX) 75 MG tablet TAKE 1 TABLET BY MOUTH EVERY DAY  . digoxin (LANOXIN) 0.125 MG tablet Take 0.5 tablets (0.0625 mg total) by mouth daily.  Marland Kitchen ezetimibe (ZETIA) 10 MG tablet Take 1 tablet (10 mg total) by mouth daily.  Marland Kitchen levothyroxine (SYNTHROID, LEVOTHROID) 25 MCG tablet TAKE 1 TABLET (25 MCG TOTAL) BY MOUTH DAILY BEFORE BREAKFAST.  . rosuvastatin (CRESTOR) 40 MG tablet Take 1 tablet (40 mg total) by mouth daily.  . sacubitril-valsartan (ENTRESTO) 97-103 MG Take 1 tablet by mouth 2 (two) times daily.  Marland Kitchen spironolactone (ALDACTONE) 25 MG tablet Take 1 tablet (25 mg total) by mouth at bedtime.   No facility-administered encounter medications on file as of 11/08/2018.     Activities of Daily Living In your present state of health, do you have any difficulty performing the following activities: 11/08/2018 06/27/2018  Hearing? N N  Vision? N N  Difficulty concentrating or making decisions? Y N  Comment forgetfulness at times -  Walking or climbing stairs? N N  Dressing or bathing? N N  Doing errands, shopping? N N  Preparing Food and eating ? N  -  Using the Toilet? N -  In the past six months, have you accidently leaked urine? N -  Do you have problems with loss of bowel control? N -  Managing your Medications? N -  Managing your Finances? N -  Housekeeping or managing your Housekeeping? N -  Some recent data might be hidden    Patient Care Team: Gl[place]ale Chard, MD as PCP - General (Internal Medicine)    Assessment:   This is a routine wellness examination for [facility].  Exercise Activities and Dietary recommendations    Goals    . Patient Stated (pt-stated)     Looking for cardiology team in [place] when relocates.       Fall Risk Fall Risk  11/08/2018 07/05/2018 06/22/2018 06/22/2018  Falls in the past year? 0 1 1 0  Number falls in past yr: - 0 0 -  Injury with Fall? - 0 0 -  Risk for fall due to : Medication side effect - - -  Follow up Falls prevention discussed - - -   Is the patient's home free of loose throw rugs in walkways, pet beds, electrical cords, etc?   yes      Grab bars in the bathroom? no      Handrails on the stairs?   yes      Adequate lighting?   yes  Timed Get Up and Go performed: n/a  Depression Screen PHQ 2/9 Scores 11/08/2018 07/05/2018 06/22/2018  PHQ - 2 Score 0 0 1  PHQ- 9 Score 6 - -     Cognitive Function        Immunization History  Administered Date(s) Administered  . Influenza-Unspecified 04/27/2013, 06/01/2018  . Pneumococcal Polysaccharide-23 06/28/2018  . Pneumococcal-Unspecified 11/25/2013    Qualifies for Shingles Vaccine? yes  Screening Tests Health Maintenance  Topic Date Due  . TETANUS/TDAP  04/09/1966  . DEXA SCAN  04/09/2012  . MAMMOGRAM  05/04/2014  . INFLUENZA VACCINE  01/26/2019  . PNA vac Low Risk Adult (2 of 2 - PCV13) 06/29/2019  . COLONOSCOPY  06/11/2022  . Hepatitis C Screening  Completed    Cancer Screenings: Lung: Low Dose CT Chest recommended if Age 32-80  years, 30 pack-year currently smoking OR have quit w/in 15years. Patient does not  qualify. Breast:  Up to date on Mammogram? No   Up to date of Bone Density/Dexa? No Colorectal: up to date  Additional Screenings: : Hepatitis C Screening: 11/05/2012     Plan:    Wants to find a cardiac team in [place] when she relocates. Scheduling mammogram for this month or next month.   I have personally reviewed and noted the following in the patient's chart:   . Medical and social history . Use of alcohol, tobacco or illicit drugs  . Current medications and supplements . Functional ability and status . Nutritional status . Physical activity . Advanced directives . List of other physicians . Hospitalizations, surgeries, and ER visits in previous 12 months . Vitals . Screenings to include cognitive, depression, and falls . Referrals and appointments  In addition, I have reviewed and discussed with patient certain preventive protocols, quality metrics, and best practice recommendations. A written personalized care plan for preventive services as well as general preventive health recommendations were provided to patient.     Kellie Simmering, LPN  4/[id number]

## 2018-11-08 NOTE — Progress Notes (Signed)
Cardiology Office Note:    Date:  11/10/2018   ID:  Andrea Hubbard, DOB 04-01-1946, MRN [id number]  PCP:  Gl[place]ale Chard, MD  Cardiologist:  Daiva Nakayama (ICD)/Mclean (CHF) Electrophysiologist:  None   Referring MD: Gl[place]ale Chard, MD   Chief Complaint  Patient presents with  . Pacemaker Check    ICD    History of Present Illness:    Andrea Hubbard is a 73 y.o. female with a hx of CAD, COPD, PAD, chronic systolic heart failure due to ischemic cardiomyopathy (EF 10-15% in December 2019, 20-25% in April 2020) biventricular involvement, single-chamber ICD.  She was hospitalized in January with an acute heart failure exacerbation and had diuresis optimize based on right and left heart catheterization.  She had a low cardiac index at 1.7.  Cardiopulmonary stress testing showed moderate to severe heart failure limitation with poor short-term prognosis.  She had extensive coronary disease, but there were no targets amenable to percutaneous revascularization.  She is not smoking any longer.  She is statin intolerant and has started ezetimibe, but does not want to take PCSK9 inhibitors.  Today she denies problems with angina, dizziness, palpitations, syncope or any shortness of breath at rest.  She does not have edema.  She appears to have functional class II status.  Her blood pressure is relatively low, but it does not bother her.  Her defibrillator is a [employer] implanted in November 2011 in [place].  She has never delivered therapy for tachyarrhythmia.  She has very infrequent episodes of nonsustained ventricular tachycardia (since the beginning of this year, only 2: one was recorded in January and one in March, longest 8 beats.).  She does not require ventricular pacing.  Generator and lead parameters are good.  Past Medical History:  Diagnosis Date  . Automatic implantable cardioverter-defibrillator in situ 2011   [employer]   .  Carotid artery disease ([place])   . Coronary artery disease   . Dysrhythmia   . History of nuclear stress test 04/26/2012   mod-severe perfusion defect in basal inferior septal and basal inf, mid inferoseptal, mid inferio & apical inferior - consistent with infarct/scar; low risk   . Hypercholesteremia   . Hypertension   . ICD (implantable cardioverter-defibrillator) in place 03/08/2016  . PVD (peripheral vascular disease) ([place])   . Silent myocardial infarction ([place]) 2009; 2010  . Tobacco abuse     Past Surgical History:  Procedure Laterality Date  . ANGIOPLASTY / STENTING FEMORAL Right 2010  . CARDIAC DEFIBRILLATOR PLACEMENT  04/2010  . Carotid Duplex  04/2012   R ECA - severe vessel narrowing with inc velocities 70-99% diameter reduction; L subclav - elevated velocities 50-69% diameter reduction; L vertebral - occlusive disease; L bulb/prox ICA - elevated velocities in prox & mid segments on ICA, 50-69% diameter reduction   . CESAREAN SECTION  1979  . COLONOSCOPY  06/11/2012   Procedure: COLONOSCOPY;  Surgeon: Juanita Craver, MD;  Location: [facility];  Service: Endoscopy;  Laterality: N/A;  . ENDARTERECTOMY Right 2010  . ILIAC ARTERY STENT Bilateral 12/30/2013  . LOWER EXTREMITY ANGIOGRAM Bilateral 12/30/2013   Procedure: LOWER EXTREMITY ANGIOGRAM;  Surgeon: Lorretta Harp, MD;  Location: [facility] CATH LAB;  Service: Cardiovascular;  Laterality: Bilateral;  . Lower Extremity Arterial Doppler  04/2012   ABI 0.63 on R and 0.77 on L  . PERCUTANEOUS CORONARY STENT INTERVENTION (PCI-S)     in [place]  . RIGHT/LEFT HEART CATH AND CORONARY ANGIOGRAPHY N/A  06/29/2018   Procedure: RIGHT/LEFT HEART CATH AND CORONARY ANGIOGRAPHY;  Surgeon: Larey Dresser, MD;  Location: [place] CV LAB;  Service: Cardiovascular;  Laterality: N/A;  . TRANSTHORACIC ECHOCARDIOGRAM  04/26/2012   EF 30-35%; imparied LV relaxation; mod post wall hypokinesis, mod-severe ant wall kypokinesis and severe septal hypokinesis; RV  systolic function mod redcued, with RV systolic pressure elevated; mild AVR    Current Medications: Current Meds  Medication Sig  . acetaminophen (TYLENOL) 325 MG tablet Take 2 tablets (650 mg total) by mouth every 4 (four) hours as needed for headache or mild pain.  Marland Kitchen aspirin EC 81 MG tablet Take 81 mg by mouth daily.  . carvedilol (COREG) 6.25 MG tablet Take 1 tablet (6.25 mg total) by mouth 2 (two) times daily with a meal.  . clopidogrel (PLAVIX) 75 MG tablet TAKE 1 TABLET BY MOUTH EVERY DAY  . digoxin (LANOXIN) 0.125 MG tablet Take 0.5 tablets (0.0625 mg total) by mouth daily.  Marland Kitchen ezetimibe (ZETIA) 10 MG tablet Take 1 tablet (10 mg total) by mouth daily.  Marland Kitchen levothyroxine (SYNTHROID, LEVOTHROID) 25 MCG tablet TAKE 1 TABLET (25 MCG TOTAL) BY MOUTH DAILY BEFORE BREAKFAST.  . rosuvastatin (CRESTOR) 40 MG tablet Take 1 tablet (40 mg total) by mouth daily.  Marland Kitchen spironolactone (ALDACTONE) 25 MG tablet Take 1 tablet (25 mg total) by mouth at bedtime.     Allergies:   Morphine and related   Social History   Socioeconomic History  . Marital status: Married    Spouse name: Not on file  . Number of children: 3  . Years of education: coolege   . Highest education level: Not on file  Occupational History  . 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 Financial resource strain: Not hard at all  . Food insecurity:    Worry: Never true    Inability: Never true  . Transportation needs:    Medical: No    Non-medical: No  Tobacco Use  . Smoking status: Current Some Day Smoker    Packs/day: 0.12    Years: 46.00    Pack years: 5.52    Types: Cigarettes  . Smokeless tobacco: Never Used  . Tobacco comment: 1 cigarette a week  Substance and Sexual Activity  . Alcohol use: No  . Drug use: No  . Sexual activity: Not Currently  Lifestyle  . Physical activity:    Days per week: 0 days    Minutes per session: 0 min  . Stress: Not at all  Relationships  . Social  connections:    Talks on phone: Not on file    Gets together: Not on file    Attends religious service: Not on file    Active member of club or organization: Not on file    Attends meetings of clubs or organizations: Not on file    Relationship status: Not on file  Other Topics Concern  . Not on file  Social History Narrative  . Not on file     Family History: The patient's family history includes Diabetes in her maternal grandmother; Hypertension in her son; Leukemia in her mother; Pancreatic cancer in her father; Rheum arthritis in her sister; Stroke in her paternal grandfather; Sudden death in her maternal grandfather.  ROS:   Please see the history of present illness.     All other systems reviewed and are negative.  EKGs/Labs/Other Studies Reviewed:    The following studies were reviewed today: Notes  from heart failure clinic April 28, echo April 28, notes from the last appointment with Dr. Alvester Chou, right and left heart catheterization from January 2020 and cardiopulmonary stress test   EKG:  EKG is  ordered today.  The ekg ordered today demonstrates Sinus rhythm, old inferior MI, absence of R wave progression V1-V4 consistent with old anterior MI, T wave inversion in leads V5, V6 and in the inferior leads.  QTC 416 ms.  Recent Labs: 06/22/2018: ALT 31 06/27/2018: B Natriuretic Peptide 954.2; TSH 10.249 06/30/2018: Magnesium 2.1 08/30/2018: Hemoglobin 13.7; Platelets 210 11/06/2018: BUN 19; Creatinine, Ser 1.17; Potassium 4.6; Sodium 136  Recent Lipid Panel    Component Value Date/Time   CHOL 182 06/21/2018 1058   TRIG 99 06/21/2018 1058   HDL 43 06/21/2018 1058   CHOLHDL 4.2 06/21/2018 1058   CHOLHDL 3.5 04/05/2016 0929   VLDL 26 04/05/2016 0929   LDLCALC 119 (H) 06/21/2018 1058    Physical Exam:    VS:  BP 92/62   Pulse 62   Ht 5\' 6"  (1.676 m)   Wt 122 lb (55.3 kg)   BMI 19.69 kg/m     Wt Readings from Last 3 Encounters:  11/08/18 122 lb (55.3 kg)  11/08/18 122  lb (55.3 kg)  08/30/18 123 lb 3.2 oz (55.9 kg)     GEN: Thin, mildly malnourished, well developed in no acute distress HEENT: Normal NECK: No JVD; No carotid bruits LYMPHATICS: No lymphadenopathy CARDIAC: Split second heart sound RRR, no murmurs, rubs, gallops RESPIRATORY:  Clear to auscultation without rales, wheezing or rhonchi  ABDOMEN: Soft, non-tender, non-distended MUSCULOSKELETAL:  No edema; No deformity  SKIN: Warm and dry NEUROLOGIC:  Alert and oriented x 3 PSYCHIATRIC:  Normal affect   ASSESSMENT:    1. Chronic systolic heart failure ([place])   2. ICD (implantable cardioverter-defibrillator) in place   3. Coronary artery disease involving native coronary artery of native heart without angina pectoris   4. PAD (peripheral artery disease),    5. Bilateral carotid artery stenosis   6. Hypercholesterolemia    PLAN:    In order of problems listed above:  1. CHF: Appears to be clinically euvolemic.  Blood pressure does not permit titration of meds. 2. ICD: Normal device function.  No adjustments were performed.  She does not require ventricular pacing and has not had any recent significant arrhythmic events.  She has been resistant to remote monitoring, but now expresses a desire to do remote downloads.  Have contacted the pacemaker rep.  It would definitely make sense to do remote downloads during the coronavirus pandemic. 3. CAD: Extensive multivessel disease not amenable to revascularization 4. PAD: Moderately reduced ABI bilaterally, asymptomatic, probably due to sedentary lifestyle 5. Carotid stenosis: Monitored by ultrasound.  Previous right carotid endarterectomy. 40-59% estimated stenosis on the left is actually improvement from her previous studies. 6. HLP: Recently started on ezetimibe.   Medication Adjustments/Labs and Tests Ordered: Current medicines are reviewed at length with the patient today.  Concerns regarding medicines are outlined above.  Orders Placed This  Encounter  Procedures  . EKG 12-Lead   No orders of the defined types were placed in this encounter.   Patient Instructions  Medication Instructions:  Continue same medications If you need a refill on your cardiac medications before your next appointment, please call your pharmacy.   Lab work: None ordered   Testing/Procedures: None ordered  Follow-Up: At [facility], you and your health needs are our priority.  As  part of our continuing mission to provide you with exceptional heart care, we have created designated Provider Care Teams.  These Care Teams include your primary Cardiologist (physician) and Advanced Practice Providers (APPs -  Physician Assistants and Nurse Practitioners) who all work together to provide you with the care you need, when you need it. . Schedule follow up appointment with Dr.Kendrik Mcshan in 12 months   Call 3 months before to schedule      Signed, Sanda Klein, MD  11/10/2018 [place]

## 2018-11-08 NOTE — Patient Instructions (Signed)
Andrea Hubbard , Thank you for taking time to come for your Medicare Wellness Visit. I appreciate your ongoing commitment to your health goals. Please review the following plan we discussed and let me know if I can assist you in the future.   Screening recommendations/referrals: Colonoscopy: 05/2012 Mammogram: scheduling Bone Density: due Recommended yearly ophthalmology/optometry visit for glaucoma screening and checkup Recommended yearly dental visit for hygiene and checkup  Vaccinations: Influenza vaccine: 05/2018 Pneumococcal vaccine: 06/2018 Tdap vaccine: due Shingles vaccine: discussed    Advanced directives: Please bring a copy of your POA (Power of Attorney) and/or Living Will to your next appointment.    Conditions/risks identified: heart issues  Next appointment: 11/12/2018 at 2:30   Preventive Care 65 Years and Older, Female Preventive care refers to lifestyle choices and visits with your health care provider that can promote health and wellness. What does preventive care include?  A yearly physical exam. This is also called an annual well check.  Dental exams once or twice a year.  Routine eye exams. Ask your health care provider how often you should have your eyes checked.  Personal lifestyle choices, including:  Daily care of your teeth and gums.  Regular physical activity.  Eating a healthy diet.  Avoiding tobacco and drug use.  Limiting alcohol use.  Practicing safe sex.  Taking low-dose aspirin every day.  Taking vitamin and mineral supplements as recommended by your health care provider. What happens during an annual well check? The services and screenings done by your health care provider during your annual well check will depend on your age, overall health, lifestyle risk factors, and family history of disease. Counseling  Your health care provider may ask you questions about your:  Alcohol use.  Tobacco use.  Drug use.  Emotional  well-being.  Home and relationship well-being.  Sexual activity.  Eating habits.  History of falls.  Memory and ability to understand (cognition).  Work and work Statistician.  Reproductive health. Screening  You may have the following tests or measurements:  Height, weight, and BMI.  Blood pressure.  Lipid and cholesterol levels. These may be checked every 5 years, or more frequently if you are over 54 years old.  Skin check.  Lung cancer screening. You may have this screening every year starting at age 36 if you have a 30-pack-year history of smoking and currently smoke or have quit within the past 15 years.  Fecal occult blood test (FOBT) of the stool. You may have this test every year starting at age 43.  Flexible sigmoidoscopy or colonoscopy. You may have a sigmoidoscopy every 5 years or a colonoscopy every 10 years starting at age 7.  Hepatitis C blood test.  Hepatitis B blood test.  Sexually transmitted disease (STD) testing.  Diabetes screening. This is done by checking your blood sugar (glucose) after you have not eaten for a while (fasting). You may have this done every 1-3 years.  Bone density scan. This is done to screen for osteoporosis. You may have this done starting at age 75.  Mammogram. This may be done every 1-2 years. Talk to your health care provider about how often you should have regular mammograms. Talk with your health care provider about your test results, treatment options, and if necessary, the need for more tests. Vaccines  Your health care provider may recommend certain vaccines, such as:  Influenza vaccine. This is recommended every year.  Tetanus, diphtheria, and acellular pertussis (Tdap, Td) vaccine. You may need a Td  booster every 10 years.  Zoster vaccine. You may need this after age 53.  Pneumococcal 13-valent conjugate (PCV13) vaccine. One dose is recommended after age 27.  Pneumococcal polysaccharide (PPSV23) vaccine. One  dose is recommended after age 21. Talk to your health care provider about which screenings and vaccines you need and how often you need them. This information is not intended to replace advice given to you by your health care provider. Make sure you discuss any questions you have with your health care provider. Document Released: 07/10/2015 Document Revised: 03/02/2016 Document Reviewed: 04/14/2015 Elsevier Interactive Patient Education  2017 [place] Prevention in the Home Falls can cause injuries. They can happen to people of all ages. There are many things you can do to make your home safe and to help prevent falls. What can I do on the outside of my home?  Regularly fix the edges of walkways and driveways and fix any cracks.  Remove anything that might make you trip as you walk through a door, such as a raised step or threshold.  Trim any bushes or trees on the path to your home.  Use bright outdoor lighting.  Clear any walking paths of anything that might make someone trip, such as rocks or tools.  Regularly check to see if handrails are loose or broken. Make sure that both sides of any steps have handrails.  Any raised decks and porches should have guardrails on the edges.  Have any leaves, snow, or ice cleared regularly.  Use sand or salt on walking paths during winter.  Clean up any spills in your garage right away. This includes oil or grease spills. What can I do in the bathroom?  Use night lights.  Install grab bars by the toilet and in the tub and shower. Do not use towel bars as grab bars.  Use non-skid mats or decals in the tub or shower.  If you need to sit down in the shower, use a plastic, non-slip stool.  Keep the floor dry. Clean up any water that spills on the floor as soon as it happens.  Remove soap buildup in the tub or shower regularly.  Attach bath mats securely with double-sided non-slip rug tape.  Do not have throw rugs and other  things on the floor that can make you trip. What can I do in the bedroom?  Use night lights.  Make sure that you have a light by your bed that is easy to reach.  Do not use any sheets or blankets that are too big for your bed. They should not hang down onto the floor.  Have a firm chair that has side arms. You can use this for support while you get dressed.  Do not have throw rugs and other things on the floor that can make you trip. What can I do in the kitchen?  Clean up any spills right away.  Avoid walking on wet floors.  Keep items that you use a lot in easy-to-reach places.  If you need to reach something above you, use a strong step stool that has a grab bar.  Keep electrical cords out of the way.  Do not use floor polish or wax that makes floors slippery. If you must use wax, use non-skid floor wax.  Do not have throw rugs and other things on the floor that can make you trip. What can I do with my stairs?  Do not leave any items on the stairs.  Make sure that there are handrails on both sides of the stairs and use them. Fix handrails that are broken or loose. Make sure that handrails are as long as the stairways.  Check any carpeting to make sure that it is firmly attached to the stairs. Fix any carpet that is loose or worn.  Avoid having throw rugs at the top or bottom of the stairs. If you do have throw rugs, attach them to the floor with carpet tape.  Make sure that you have a light switch at the top of the stairs and the bottom of the stairs. If you do not have them, ask someone to add them for you. What else can I do to help prevent falls?  Wear shoes that:  Do not have high heels.  Have rubber bottoms.  Are comfortable and fit you well.  Are closed at the toe. Do not wear sandals.  If you use a stepladder:  Make sure that it is fully opened. Do not climb a closed stepladder.  Make sure that both sides of the stepladder are locked into place.  Ask  someone to hold it for you, if possible.  Clearly mark and make sure that you can see:  Any grab bars or handrails.  First and last steps.  Where the edge of each step is.  Use tools that help you move around (mobility aids) if they are needed. These include:  Canes.  Walkers.  Scooters.  Crutches.  Turn on the lights when you go into a dark area. Replace any light bulbs as soon as they burn out.  Set up your furniture so you have a clear path. Avoid moving your furniture around.  If any of your floors are uneven, fix them.  If there are any pets around you, be aware of where they are.  Review your medicines with your doctor. Some medicines can make you feel dizzy. This can increase your chance of falling. Ask your doctor what other things that you can do to help prevent falls. This information is not intended to replace advice given to you by your health care provider. Make sure you discuss any questions you have with your health care provider. Document Released: 04/09/2009 Document Revised: 11/19/2015 Document Reviewed: 07/18/2014 Elsevier Interactive Patient Education  2017 [employer].

## 2018-11-08 NOTE — Patient Instructions (Signed)
Medication Instructions:  Continue same medications If you need a refill on your cardiac medications before your next appointment, please call your pharmacy.   Lab work: None ordered   Testing/Procedures: None ordered  Follow-Up: At [facility], you and your health needs are our priority.  As part of our continuing mission to provide you with exceptional heart care, we have created designated Provider Care Teams.  These Care Teams include your primary Cardiologist (physician) and Advanced Practice Providers (APPs -  Physician Assistants and Nurse Practitioners) who all work together to provide you with the care you need, when you need it. . Schedule follow up appointment with Dr.Croitoru in 12 months   Call 3 months before to schedule

## 2018-11-10 ENCOUNTER — Encounter: Payer: Self-pay | Admitting: Cardiovascular Disease

## 2018-11-12 ENCOUNTER — Ambulatory Visit (INDEPENDENT_AMBULATORY_CARE_PROVIDER_SITE_OTHER): Payer: Medicare Other | Admitting: *Deleted

## 2018-11-12 ENCOUNTER — Ambulatory Visit (INDEPENDENT_AMBULATORY_CARE_PROVIDER_SITE_OTHER): Payer: Medicare Other | Admitting: Nurse Practitioner

## 2018-11-12 ENCOUNTER — Encounter: Payer: Self-pay | Admitting: Nurse Practitioner

## 2018-11-12 ENCOUNTER — Other Ambulatory Visit: Payer: Self-pay

## 2018-11-12 VITALS — BP 116/64 | HR 76 | Wt 122.0 lb

## 2018-11-12 DIAGNOSIS — R202 Paresthesia of skin: Secondary | ICD-10-CM

## 2018-11-12 DIAGNOSIS — R7309 Other abnormal glucose: Secondary | ICD-10-CM

## 2018-11-12 DIAGNOSIS — E039 Hypothyroidism, unspecified: Secondary | ICD-10-CM | POA: Diagnosis not present

## 2018-11-12 DIAGNOSIS — I255 Ischemic cardiomyopathy: Secondary | ICD-10-CM | POA: Diagnosis not present

## 2018-11-12 NOTE — Progress Notes (Signed)
Virtual Visit via Telephone     This visit type was conducted due to national recommendations for restrictions regarding the COVID-19 Pandemic (e.g. social distancing) in an effort to limit this patient's exposure and mitigate transmission in our community.  Patients identity confirmed using two different identifiers.  This format is felt to be most appropriate for this patient at this time.  All issues noted in this document were discussed and addressed.  No physical exam was performed (except for noted visual exam findings with Video Visits).    Date:  11/12/2018   ID:  Andrea Hubbard, DOB August 04, 1945, MRN [id number]  Patient Location:  Home - spoke with Andrea Hubbard  Provider location:   Office    Chief Complaint:  Thyroid follow up  History of Present Illness:    Andrea Hubbard is a 73 y.o. female who presents via video conferencing for a telehealth visit today.    The patient does not have symptoms concerning for COVID-19 infection (fever, chills, cough, or new shortness of breath).   She is taking her thyroid medications at 3am.    Thyroid Problem  Presents for follow-up visit. Patient reports no anxiety, constipation or fatigue.     Past Medical History:  Diagnosis Date  . Automatic implantable cardioverter-defibrillator in situ 2011   [employer]   . Carotid artery disease ([place])   . Coronary artery disease   . Dysrhythmia   . History of nuclear stress test 04/26/2012   mod-severe perfusion defect in basal inferior septal and basal inf, mid inferoseptal, mid inferio & apical inferior - consistent with infarct/scar; low risk   . Hypercholesteremia   . Hypertension   . ICD (implantable cardioverter-defibrillator) in place 03/08/2016  . PVD (peripheral vascular disease) ([place])   . Silent myocardial infarction ([place]) 2009; 2010  . Tobacco abuse    Past Surgical History:  Procedure Laterality Date  . ANGIOPLASTY / STENTING FEMORAL Right 2010  . CARDIAC  DEFIBRILLATOR PLACEMENT  04/2010  . Carotid Duplex  04/2012   R ECA - severe vessel narrowing with inc velocities 70-99% diameter reduction; L subclav - elevated velocities 50-69% diameter reduction; L vertebral - occlusive disease; L bulb/prox ICA - elevated velocities in prox & mid segments on ICA, 50-69% diameter reduction   . CESAREAN SECTION  1979  . COLONOSCOPY  06/11/2012   Procedure: COLONOSCOPY;  Surgeon: Juanita Craver, MD;  Location: [facility];  Service: Endoscopy;  Laterality: N/A;  . ENDARTERECTOMY Right 2010  . ILIAC ARTERY STENT Bilateral 12/30/2013  . LOWER EXTREMITY ANGIOGRAM Bilateral 12/30/2013   Procedure: LOWER EXTREMITY ANGIOGRAM;  Surgeon: Lorretta Harp, MD;  Location: [facility] CATH LAB;  Service: Cardiovascular;  Laterality: Bilateral;  . Lower Extremity Arterial Doppler  04/2012   ABI 0.63 on R and 0.77 on L  . PERCUTANEOUS CORONARY STENT INTERVENTION (PCI-S)     in [place]  . RIGHT/LEFT HEART CATH AND CORONARY ANGIOGRAPHY N/A 06/29/2018   Procedure: RIGHT/LEFT HEART CATH AND CORONARY ANGIOGRAPHY;  Surgeon: Larey Dresser, MD;  Location: [facility] LAB;  Service: Cardiovascular;  Laterality: N/A;  . TRANSTHORACIC ECHOCARDIOGRAM  04/26/2012   EF 30-35%; imparied LV relaxation; mod post wall hypokinesis, mod-severe ant wall kypokinesis and severe septal hypokinesis; RV systolic function mod redcued, with RV systolic pressure elevated; mild AVR     Current Meds  Medication Sig  . acetaminophen (TYLENOL) 325 MG tablet Take 2 tablets (650 mg total) by mouth every 4 (four) hours as needed for headache  or mild pain.  Marland Kitchen aspirin EC 81 MG tablet Take 81 mg by mouth daily.  . carvedilol (COREG) 6.25 MG tablet Take 1 tablet (6.25 mg total) by mouth 2 (two) times daily with a meal.  . clopidogrel (PLAVIX) 75 MG tablet TAKE 1 TABLET BY MOUTH EVERY DAY  . digoxin (LANOXIN) 0.125 MG tablet Take 0.5 tablets (0.0625 mg total) by mouth daily.  Marland Kitchen ezetimibe (ZETIA) 10 MG tablet Take 1 tablet  (10 mg total) by mouth daily.  Marland Kitchen levothyroxine (SYNTHROID, LEVOTHROID) 25 MCG tablet TAKE 1 TABLET (25 MCG TOTAL) BY MOUTH DAILY BEFORE BREAKFAST.  . rosuvastatin (CRESTOR) 40 MG tablet Take 1 tablet (40 mg total) by mouth daily.  . sacubitril-valsartan (ENTRESTO) 97-103 MG Take 1 tablet by mouth 2 (two) times daily.  Marland Kitchen spironolactone (ALDACTONE) 25 MG tablet Take 1 tablet (25 mg total) by mouth at bedtime.     Allergies:   Morphine and related   Social History   Tobacco Use  . Smoking status: Current Some Day Smoker    Packs/day: 0.12    Years: 46.00    Pack years: 5.52    Types: Cigarettes  . Smokeless tobacco: Never Used  . Tobacco comment: 1 cigarette a week  Substance Use Topics  . Alcohol use: No  . Drug use: No     Family Hx: The patient's family history includes Diabetes in her maternal grandmother; Hypertension in her son; Leukemia in her mother; Pancreatic cancer in her father; Rheum arthritis in her sister; Stroke in her paternal grandfather; Sudden death in her maternal grandfather.  ROS:   Please see the history of present illness.    Review of Systems  Constitutional: Negative for fatigue.  Gastrointestinal: Negative for constipation.  Psychiatric/Behavioral: The patient is not nervous/anxious.     All other systems reviewed and are negative.   Labs/Other Tests and Data Reviewed:    Recent Labs: 06/22/2018: ALT 31 06/27/2018: B Natriuretic Peptide 954.2; TSH 10.249 06/30/2018: Magnesium 2.1 08/30/2018: Hemoglobin 13.7; Platelets 210 11/06/2018: BUN 19; Creatinine, Ser 1.17; Potassium 4.6; Sodium 136   Recent Lipid Panel Lab Results  Component Value Date/Time   CHOL 182 06/21/2018 10:58 AM   TRIG 99 06/21/2018 10:58 AM   HDL 43 06/21/2018 10:58 AM   CHOLHDL 4.2 06/21/2018 10:58 AM   CHOLHDL 3.5 04/05/2016 09:29 AM   LDLCALC 119 (H) 06/21/2018 10:58 AM    Wt Readings from Last 3 Encounters:  11/12/18 122 lb (55.3 kg)  11/08/18 122 lb (55.3 kg)   11/08/18 122 lb (55.3 kg)     Exam:    Vital Signs:  BP 116/64 (BP Location: Left Arm, Patient Position: Sitting, Cuff Size: Small)   Pulse 76   Wt 122 lb (55.3 kg)   BMI 19.69 kg/m     Physical Exam  Constitutional: No distress.  Psychiatric: Mood, memory, affect and judgment normal.    ASSESSMENT & PLAN:    1. Hypothyroidism, unspecified type  Discussed the purpose of her taking thyroid medications 2 hours before any other medications, she is taking this at 3am as she gets up early and eats  Will check thyroid levels - TSH - T3 - T4, Free  2. Tingling  Intermittent tingling to her hands  Will check HgbA1c  Encouraged to stay well hydrated - Hemoglobin A1c  3. Abnormal glucose Chronic No current medications Encouraged to limit intake of sugary foods and drinks Encouraged to increase physical activity to 150 minutes per week - Hemoglobin  A1c    COVID-19 Education: The signs and symptoms of COVID-19 were discussed with the patient and how to seek care for testing (follow up with PCP or arrange E-visit).  The importance of social distancing was discussed today.  Patient Risk:   After full review of this patients clinical status, I feel that they are at least moderate risk at this time.  Time:   Today, I have spent 12 minutes/ seconds with the patient with telehealth technology discussing above diagnoses.     Medication Adjustments/Labs and Tests Ordered: Current medicines are reviewed at length with the patient today.  Concerns regarding medicines are outlined above.   Tests Ordered: No orders of the defined types were placed in this encounter.   Medication Changes: No orders of the defined types were placed in this encounter.   Disposition:  Follow up in 3 month(s)  Signed, Minette Brine, FNP

## 2018-11-13 ENCOUNTER — Other Ambulatory Visit: Payer: Medicare Other

## 2018-11-13 DIAGNOSIS — R7309 Other abnormal glucose: Secondary | ICD-10-CM

## 2018-11-13 DIAGNOSIS — E039 Hypothyroidism, unspecified: Secondary | ICD-10-CM

## 2018-11-13 DIAGNOSIS — R202 Paresthesia of skin: Secondary | ICD-10-CM

## 2018-11-13 LAB — CUP PACEART REMOTE DEVICE CHECK
Battery Remaining Longevity: 54 mo
Battery Remaining Percentage: 58 %
Brady Statistic RV Percent Paced: 0 %
Date Time Interrogation Session: 20200516213600
HighPow Impedance: 48 Ohm
Implantable Lead Implant Date: 20111111
Implantable Lead Location: 753860
Implantable Lead Model: 185
Implantable Lead Serial Number: 12345
Implantable Pulse Generator Implant Date: 20111111
Lead Channel Impedance Value: 374 Ohm
Lead Channel Pacing Threshold Amplitude: 0.6 V
Lead Channel Pacing Threshold Pulse Width: 0.5 ms
Lead Channel Setting Pacing Amplitude: 2.4 V
Lead Channel Setting Pacing Pulse Width: 0.5 ms
Lead Channel Setting Sensing Sensitivity: 0.3 mV
Pulse Gen Serial Number: 275874

## 2018-11-14 LAB — HEMOGLOBIN A1C
Est. average glucose Bld gHb Est-mCnc: 126 mg/dL
Hgb A1c MFr Bld: 6 % — ABNORMAL HIGH (ref 4.8–5.6)

## 2018-11-14 LAB — T4, FREE: Free T4: 0.79 ng/dL — ABNORMAL LOW (ref 0.82–1.77)

## 2018-11-14 LAB — TSH: TSH: 5.22 u[IU]/mL — ABNORMAL HIGH (ref 0.450–4.500)

## 2018-11-14 LAB — T3: T3, Total: 110 ng/dL (ref 71–180)

## 2018-11-16 ENCOUNTER — Encounter: Payer: Self-pay | Admitting: Internal Medicine

## 2018-11-16 LAB — PACEMAKER DEVICE OBSERVATION

## 2018-11-17 DIAGNOSIS — B028 Zoster with other complications: Secondary | ICD-10-CM | POA: Diagnosis not present

## 2018-11-21 ENCOUNTER — Encounter: Payer: Self-pay | Admitting: Cardiology

## 2018-11-21 NOTE — Progress Notes (Signed)
Remote ICD transmission.   

## 2018-11-30 ENCOUNTER — Telehealth: Payer: Self-pay | Admitting: Cardiovascular Disease

## 2018-11-30 NOTE — Telephone Encounter (Signed)
  [facility] to reschedule appt to 12/12/18 for an in office visit. Dr Gwenlyn Found will be out of office on 01/23/19

## 2018-12-03 ENCOUNTER — Telehealth (HOSPITAL_COMMUNITY): Payer: Self-pay

## 2018-12-03 ENCOUNTER — Ambulatory Visit (HOSPITAL_COMMUNITY)
Admission: RE | Admit: 2018-12-03 | Discharge: 2018-12-03 | Disposition: A | Discharge status: Home / Self Care | Payer: Medicare Other | Source: Ambulatory Visit | Attending: Cardiology | Admitting: Cardiology

## 2018-12-03 ENCOUNTER — Other Ambulatory Visit: Payer: Self-pay

## 2018-12-03 DIAGNOSIS — I5022 Chronic systolic (congestive) heart failure: Secondary | ICD-10-CM | POA: Diagnosis not present

## 2018-12-03 MED ORDER — SACUBITRIL-VALSARTAN 49-51 MG PO TABS: 1.0000 | ORAL_TABLET | Freq: Two times a day (BID) | ORAL | 1 refills | Status: DC

## 2018-12-03 NOTE — Progress Notes (Signed)
Heart Failure TeleHealth Note  Due to national recommendations of social distancing due to [place] 19, Audio/video telehealth visit is felt to be most appropriate for this patient at this time.  See MyChart message from today for patient consent regarding telehealth for [facility].  Date:  12/03/2018   ID:  Andrea Hubbard, DOB 14-Oct-1945, MRN [id number]  Location: Home  Provider location: [facility] Type of Visit: Established patient   PCP:  Glendale Chard, MD  Cardiologist:  Dr. Aundra Dubin  Chief Complaint: Fatigue   History of Present Illness: Andrea Hubbard is a 73 y.o. female who presents via audio/video conferencing for a telehealth visit today.     she denies symptoms worrisome for COVID 19.   Patient has a history of CAD, COPD, PAD, and ischemic cardiomyopathy with chronic systolic CHF.  She was seen by Dr. Gwenlyn Found in the past and developed increased dyspnea in 12/19. Echo in 12/19 showed EF 10-15% with moderately reduced RV systolic function and moderate-severe AI.  She was then admitted in 1/20 with increased dyspnea and peripheral edema.  She was diuresed and right/left heart cath was done.  Cardiac index was low at 1.7.  She had significant CAD but there were no interventional targets.  Medications were adjusted and she was discharged home.   CPX in 1/20 was worrisome for poor short-term prognosis, showed moderate-severe HF limitation.  Repeat echo was done today and reviewed, showing EF 20-25%, inferior akinesis and septal severe hypokinesis, normal RV size with moderately decreased systolic function, moderate AI, mild TR. ABIs in 1/20 showed moderate PAD bilaterally.    She was seen in lipid and clinic and refused to start PCSK-9 inhibitor.  She was willing to add Zetia.    She is no longer smoking.   She is doing reasonably well symptomatically still.  She will be moving to [place] soon to live near her daughter.  Poor appetite, weight decreasing.   No dyspnea with normal activities and walking on flat ground.  She does get short of breath carrying a heavy load (has been moving, carrying boxes).  No orthopnea/PND.  No chest pain.  No claudication.  Main problem recently has been lightheadedness with standing. She has noted this since we increase Entresto to 97/103 bid at last appointment.  She has not fallen but SBP has been as low as in the 70s.  Today, 107/49.   Labs (12/19): LDL 119 Labs (1/20): digoxin 0.8, K 4.7, creatinine 1.19 Labs (2/20): K 4.6, creatinine 0.94 Labs (3/20): K 4.9, creatinine 0.99, digoxin 0.6, hgb 13.7 Labs (4/20): digoxin 0.4, B12 normal Labs (5/20): K 4.6, creatinine 1.17, TSH mildly elevated 5.2  PMH: 1. COPD: Quit smoking 1/20.   - PFTs (1/20): FVC 63%, FEV1 54%, TLC 177%, DLCO 33%. Moderately severe emphysema.  2. Carotid stenosis: s/p right CEA in 2010.  - Carotid dopplers (4/12): 87-86% LICA stenosis, s/p R CEA.  3. PAD: s/p PCI to right CIA and left CIA in 2015.  Known right SFA occlusion.  - ABIs 0.63 right, 0.69 left (1/20).  4. CAD: MI with PCI in 2010 in [place].  - 1/20 LHC: 40% pLAD, 95% stenosis proximal small-moderate D1, small-moderate D2 70% ostial stenosis, small D3 99% proximal stenosis, 60-70% ostial stenosis small-moderate OM1, patent stent in OM2, occluded RCA at ostium with left => right collaterals.  5. Chronic systolic CHF: Ischemic cardiomyopathy.  [place].  - Echo (12/19): EF 10-15% with mild LV dilation,  diffuse hypokinesis, moderate-severe AI, mild MR, PASP 61 mmHg, moderately reduced RV systolic function, severe TR.  - RHC (1/20): mean RA 6, PA 40/16 mean 25, mean PCWP 12, CI 1.7, PVR 4.6 WU. PAPi 4.  - CPX (1/20): peak VO2 14.4, VE/VCO2 slope 57, RER 1.03 => moderate-severe HF limitation with concern for poor short term prognosis.  - Echo (2/20): EF 20-25%, inferior akinesis and septal severe hypokinesis, normal RV size with moderately decreased systolic function,  moderate AI, mild TR. 6. HTN 7. Hypothyroidism 8. Hyperlipidemia  Current Outpatient Medications  Medication Sig Dispense Refill  . acetaminophen (TYLENOL) 325 MG tablet Take 2 tablets (650 mg total) by mouth every 4 (four) hours as needed for headache or mild pain.    Marland Kitchen aspirin EC 81 MG tablet Take 81 mg by mouth daily.    . carvedilol (COREG) 6.25 MG tablet Take 1 tablet (6.25 mg total) by mouth 2 (two) times daily with a meal. 180 tablet 3  . clopidogrel (PLAVIX) 75 MG tablet TAKE 1 TABLET BY MOUTH EVERY DAY 90 tablet 3  . digoxin (LANOXIN) 0.125 MG tablet Take 0.5 tablets (0.0625 mg total) by mouth daily. 90 tablet 3  . ezetimibe (ZETIA) 10 MG tablet Take 1 tablet (10 mg total) by mouth daily. 90 tablet 3  . levothyroxine (SYNTHROID, LEVOTHROID) 25 MCG tablet TAKE 1 TABLET (25 MCG TOTAL) BY MOUTH DAILY BEFORE BREAKFAST. 90 tablet 0  . rosuvastatin (CRESTOR) 40 MG tablet Take 1 tablet (40 mg total) by mouth daily. 90 tablet 3  . sacubitril-valsartan (ENTRESTO) 49-51 MG Take 1 tablet by mouth 2 (two) times daily. 180 tablet 1  . spironolactone (ALDACTONE) 25 MG tablet Take 1 tablet (25 mg total) by mouth at bedtime. 30 tablet 5   No current facility-administered medications for this encounter.     Allergies:   Morphine and related   Social History:  The patient  reports that she has been smoking cigarettes. She has a 5.52 pack-year smoking history. She has never used smokeless tobacco. She reports that she does not drink alcohol or use drugs.   Family History:  The patient's family history includes Diabetes in her maternal grandmother; Hypertension in her son; Leukemia in her mother; Pancreatic cancer in her father; Rheum arthritis in her sister; Stroke in her paternal grandfather; Sudden death in her maternal grandfather.   ROS:  Please see the history of present illness.   All other systems are personally reviewed and negative.   Exam:  (Video/Tele Health Call; Exam is subjective and  or/visual.) Neck: No JVD General:  Speaks in full sentences. No resp difficulty. Lungs: Normal respiratory effort with conversation.  Abdomen: Non-distended per patient report Extremities: Pt denies edema. Neuro: Alert & oriented x 3.   Recent Labs: 06/22/2018: ALT 31 06/27/2018: B Natriuretic Peptide 954.2 06/30/2018: Magnesium 2.1 08/30/2018: Hemoglobin 13.7; Platelets 210 11/06/2018: BUN 19; Creatinine, Ser 1.17; Potassium 4.6; Sodium 136 11/13/2018: TSH 5.220  Personally reviewed   Wt Readings from Last 3 Encounters:  11/12/18 55.3 kg (122 lb)  11/08/18 55.3 kg (122 lb)  11/08/18 55.3 kg (122 lb)      ASSESSMENT AND PLAN:  1.  Chronic systolic CHF: Ischemic cardiomyopathy.  Echo in 1/20 with EF 10-15%, moderate RV systolic dysfunction.  Repeat echo in 2/20 with EF 20-25%, normal RV size with moderate systolic dysfunction.  [place] ICD, narrow QRS so not candidate for CRT.  CPX in 1/20 with moderate-severe HF limitation and concern for poor short-term  prognosis.  RHC in 1/20 with low cardiac index, 1.7. She has been doing better symptomatically recently, now NYHA class II.  She is not volume overloaded by video exam and weight has been decreasing.  However, she has been having orthostatic-type symptoms since increasing Entresto to 97/103 bid, no falls or syncope.       - Continue Coreg 6.25 mg bid.  - Continue digoxin 0.0625 mg daily, check level with next labs.  - Decrease Entresto back to 49/51 bid.     - Continue spironolactone 25 mg daily. I will arrange for BMET.  - She can stay off torsemide.  - Given low output HF by RHC and suggestion of poor short-term prognosis on CPX, we have been discussing LVAD.  Limitations for LVAD include moderate-severe COPD and moderate RV dysfunction. PAPi on RHC was 4, suggesting that RV function would probably be reasonable for LVAD.  She has seen Dr. Prescott Gum. She has actually been doing considerably better recently and symptoms are now  NYHA class II.  I do not think we have to rush to LVAD at this point.  Physiologically, she seems younger than her 46 years and expresses interest in evaluation for transplantation.  She is going to be moving to [place] soon.  I am going to put her in touch with Dr. Artemio Aly at [facility] clinic for transplant evaluation and further followup after she moves to [place].  - We are awaiting cardiac rehab for her, will have to wait until coronavirus restrictions are lifted.    2. CAD: Significant CAD on 1/20 cath but no interventional targets.  No chest pain.  - Continue ASA 81, Crestor 40 daily, and Zetia 10 mg daily.  3. Aortic insufficiency: Moderate-severe on 1/20 echo, appeared moderate on 2/20 echo.  If she gets an LVAD, this may need to be addressed surgically.  4. Tricuspid regurgitation: Severe on 1/20 echo but with effective diuresis, appeared only mild on 2/20 echo.   5. PAD: No claudication but bilateral ABIs around 0.6, signifying significant PAD.   - She has quit smoking.  - Continue statin and aspirin.  6. Carotid stenosis: Repeat carotid dopplers in 1/21.  7. COPD: She recently quit smoking.  Moderate-severe COPD on PFTs in 1/20.  8. Hyperlipidemia: Followed in lipid clinic.  Repatha was recommended but she did not want to take this medication.  Instead, she is now on Crestor + Zetia.   COVID screen The patient does not have any symptoms that suggest any further testing/ screening at this time.  Social distancing reinforced today.  Patient Risk: After full review of this patients clinical status, I feel that they are at moderate risk for cardiac decompensation at this time.  Relevant cardiac medications were reviewed at length with the patient today. The patient does not have concerns regarding their medications at this time.   Recommended follow-up:  3 wks in office.  She will likely be in [place] after this.   Today, I have spent 18 minutes with the patient with telehealth  technology discussing the above issues .    Signed, Loralie Champagne, MD  12/03/2018  [facility] [address] [facility] [place] [address] [phone] (office) [phone] (fax)

## 2018-12-03 NOTE — Telephone Encounter (Signed)
LVM to review AVS:  1. Followup in 3 wks with me in person. /routed to [facility] 2. BMET, lipids, digoxin level to be done in the next week. /16 June 3. Decrease Entresto to 49/51 bid. /script sent 4. Bring BP readings when she has followup (check daily).   Left office # to return call.

## 2018-12-04 ENCOUNTER — Encounter (HOSPITAL_COMMUNITY): Payer: Medicare Other | Admitting: Cardiology

## 2018-12-10 ENCOUNTER — Other Ambulatory Visit: Payer: Self-pay

## 2018-12-10 ENCOUNTER — Other Ambulatory Visit: Payer: Medicare Other

## 2018-12-10 ENCOUNTER — Other Ambulatory Visit: Payer: Self-pay | Admitting: Internal Medicine

## 2018-12-10 DIAGNOSIS — E039 Hypothyroidism, unspecified: Secondary | ICD-10-CM

## 2018-12-11 ENCOUNTER — Ambulatory Visit (HOSPITAL_COMMUNITY)
Admission: RE | Admit: 2018-12-11 | Discharge: 2018-12-11 | Disposition: A | Discharge status: Home / Self Care | Payer: Medicare Other | Source: Ambulatory Visit | Attending: Internal Medicine | Admitting: Internal Medicine

## 2018-12-11 DIAGNOSIS — I5022 Chronic systolic (congestive) heart failure: Secondary | ICD-10-CM | POA: Insufficient documentation

## 2018-12-11 LAB — LIPID PANEL
Cholesterol: 113 mg/dL (ref 0–200)
HDL: 47 mg/dL (ref >40)
LDL Cholesterol: 45 mg/dL (ref 0–99)
Total CHOL/HDL Ratio: 2.4 RATIO
Triglycerides: 105 mg/dL (ref <150)
VLDL: 21 mg/dL (ref 0–40)

## 2018-12-11 LAB — BASIC METABOLIC PANEL
Anion gap: 8 (ref 5–15)
BUN: 18 mg/dL (ref 8–23)
CO2: 23 mmol/L (ref 22–32)
Calcium: 9.1 mg/dL (ref 8.9–10.3)
Chloride: 99 mmol/L (ref 98–111)
Creatinine, Ser: 1 mg/dL (ref 0.44–1.00)
GFR calc Af Amer: >60 mL/min (ref >60)
GFR calc non Af Amer: 57 mL/min — ABNORMAL LOW (ref >60)
Glucose, Bld: 120 mg/dL — ABNORMAL HIGH (ref 70–99)
Potassium: 4.5 mmol/L (ref 3.5–5.1)
Sodium: 130 mmol/L — ABNORMAL LOW (ref 135–145)

## 2018-12-11 LAB — T3: T3, Total: 131 ng/dL (ref 71–180)

## 2018-12-11 LAB — T4: T4, Total: 7 ug/dL (ref 4.5–12.0)

## 2018-12-11 LAB — TSH: TSH: 9.01 u[IU]/mL — ABNORMAL HIGH (ref 0.450–4.500)

## 2018-12-11 LAB — DIGOXIN LEVEL: Digoxin Level: 0.6 ng/mL — ABNORMAL LOW (ref 0.8–2.0)

## 2018-12-12 ENCOUNTER — Encounter: Payer: Self-pay | Admitting: Cardiovascular Disease

## 2018-12-12 ENCOUNTER — Ambulatory Visit (INDEPENDENT_AMBULATORY_CARE_PROVIDER_SITE_OTHER): Payer: Medicare Other | Admitting: Cardiovascular Disease

## 2018-12-12 ENCOUNTER — Other Ambulatory Visit: Payer: Self-pay

## 2018-12-12 ENCOUNTER — Telehealth (HOSPITAL_COMMUNITY): Payer: Self-pay

## 2018-12-12 VITALS — BP 92/58 | HR 63 | Temp 97.7°F | Ht 66.0 in | Wt 123.0 lb

## 2018-12-12 DIAGNOSIS — E782 Mixed hyperlipidemia: Secondary | ICD-10-CM | POA: Diagnosis not present

## 2018-12-12 DIAGNOSIS — Z95810 Presence of automatic (implantable) cardiac defibrillator: Secondary | ICD-10-CM | POA: Diagnosis not present

## 2018-12-12 DIAGNOSIS — Z720 Tobacco use: Secondary | ICD-10-CM | POA: Diagnosis not present

## 2018-12-12 DIAGNOSIS — I255 Ischemic cardiomyopathy: Secondary | ICD-10-CM | POA: Diagnosis not present

## 2018-12-12 DIAGNOSIS — I739 Peripheral vascular disease, unspecified: Secondary | ICD-10-CM | POA: Diagnosis not present

## 2018-12-12 DIAGNOSIS — I10 Essential (primary) hypertension: Secondary | ICD-10-CM | POA: Diagnosis not present

## 2018-12-12 DIAGNOSIS — I6523 Occlusion and stenosis of bilateral carotid arteries: Secondary | ICD-10-CM | POA: Diagnosis not present

## 2018-12-12 DIAGNOSIS — I2510 Atherosclerotic heart disease of native coronary artery without angina pectoris: Secondary | ICD-10-CM | POA: Diagnosis not present

## 2018-12-12 LAB — HEPATIC FUNCTION PANEL
ALT: 67 IU/L — ABNORMAL HIGH (ref 0–32)
AST: 57 IU/L — ABNORMAL HIGH (ref 0–40)
Albumin: 4.3 g/dL (ref 3.7–4.7)
Alkaline Phosphatase: 63 IU/L (ref 39–117)
Bilirubin Total: 0.3 mg/dL (ref 0.0–1.2)
Bilirubin, Direct: 0.1 mg/dL (ref 0.00–0.40)
Total Protein: 6.6 g/dL (ref 6.0–8.5)

## 2018-12-12 LAB — LIPID PANEL
Chol/HDL Ratio: 2.6 ratio (ref 0.0–4.4)
Cholesterol, Total: 113 mg/dL (ref 100–199)
HDL: 43 mg/dL (ref >39)
LDL Calculated: 56 mg/dL (ref 0–99)
Triglycerides: 68 mg/dL (ref 0–149)
VLDL Cholesterol Cal: 14 mg/dL (ref 5–40)

## 2018-12-12 NOTE — Assessment & Plan Note (Signed)
History of CAD status post right left heart cath by Dr. [name] 06/29/2018 revealing diffuse CAD without interventional targets.  Her CA was occluded at the origin with left-to-right collaterals.  She is on aspirin and Plavix.

## 2018-12-12 NOTE — Telephone Encounter (Signed)
LVM to cut back on fluid intake, left call back number for questions or concerns.

## 2018-12-12 NOTE — Assessment & Plan Note (Addendum)
History of PAD moderately depressed ABIs but without claudication.  She had carotid endarterectomy performed November 2013.  Carotid Dopplers performed 06/28/2018 revealed a widely patent right carotid endarterectomy site with moderate left ICA stenosis.

## 2018-12-12 NOTE — Progress Notes (Signed)
12/12/2018 Andrea Hubbard   10/11/1945  [id number]  Primary Physician Andrea Chard, MD Primary Cardiologist: Andrea Harp MD Andrea Hubbard, [place]  HPI:  Andrea Hubbard is a 73 y.o.  thin appearing married [place] American female, mother of 3 children who moved from [place]  to retire in [place]. Her daughter went to [employer]. She is a retired Mudlogger for [employer].I last saw her in the office  07/31/2018.Marland Kitchen   Her cardiac risk factor profile is positive for a 20+ pack-year history of tobacco abuse, currently smoking one-half pack per week. She has treated hypertension and hyperlipidemia. There is no family history for heart disease. She apparently had a "silent myocardial infarction" in the winter of 2010. Her cardiovascular evaluation began because of symptoms of claudication. She ultimately underwent right carotid endarterectomy for asymptomatic carotid disease, a PCI stent of an artery in her heart, and placement of a prophylactic ICD as well as stenting of her right lower extremity. She denies chest pain or shortness of breath . She does continue to smoke.  She has been followed up with Dr. Sallyanne Hubbard for her ICD.Her lastDopplers performed of her carotids performed 9/20/2017reveal a widely patent right internal carotid artery endarterectomy site with moderate disease in her left internal carotid artery that has remained stable. She has had some progression of disease in her lower extremities with gradual onset of right lower extremity claudication.I performed angiography on her 12/30/13 stenting her left common iliac, right common and external iliac arteries. I also performed chocolate balloon angioplasty of her mid left SFA resulting. Moderate left lower dissection which L to the left alone. Her symptoms of claudication improved somewhat after this. I have not seen her back since that time although Dr. Sallyanne Hubbard continues to follow her ICD. She has  stopped smoking60months ago. Her recent lipid profile was excellent.     She underwent right and left heart cath by Dr. Algernon Hubbard revealing occluded dominant RCA with left-to-right collaterals and branch vessel disease.  She is not a revascularization candidate.  She underwent consultation with Dr. Darcey Hubbard who thought she was a candidate for LVAD therapy plus or minus RVAD therapy as destination therapy .   Since I saw her 4 months ago she continues to improve.  She is on Entresto, carvedilol and spironolactone.  Her EF is improved from 10 to 50% up to 20 to 25% by recent 2D echo performed in February.  Is currently class II.  She intends to move to [place] in the near future pursue the possibility of heart transplantation.  She has stopped smoking since I saw her last.   Current Meds  Medication Sig  . acetaminophen (TYLENOL) 325 MG tablet Take 2 tablets (650 mg total) by mouth every 4 (four) hours as needed for headache or mild pain.  Marland Kitchen aspirin EC 81 MG tablet Take 81 mg by mouth daily.  . carvedilol (COREG) 6.25 MG tablet Take 1 tablet (6.25 mg total) by mouth 2 (two) times daily with a meal.  . clopidogrel (PLAVIX) 75 MG tablet TAKE 1 TABLET BY MOUTH EVERY DAY  . digoxin (LANOXIN) 0.125 MG tablet Take 0.5 tablets (0.0625 mg total) by mouth daily.  Marland Kitchen levothyroxine (SYNTHROID) 25 MCG tablet TAKE 1 TABLET (25 MCG TOTAL) BY MOUTH DAILY BEFORE BREAKFAST.  . rosuvastatin (CRESTOR) 40 MG tablet Take 1 tablet (40 mg total) by mouth daily.  . sacubitril-valsartan (ENTRESTO) 49-51 MG Take 1 tablet by mouth 2 (two)  times daily.  Marland Kitchen spironolactone (ALDACTONE) 25 MG tablet Take 1 tablet (25 mg total) by mouth at bedtime.     Allergies  Allergen Reactions  . Morphine And Related Other (See Comments) and Hypertension    Raises blood pressure, makes the patient "hyper"    Social History   Socioeconomic History  . Marital status: Married    Spouse name: Not on file  . Number of children: 3  .  Years of education: coolege   . Highest education level: Not on file  Occupational History  . 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 Financial resource strain: Not hard at all  . Food insecurity    Worry: Never true    Inability: Never true  . Transportation needs    Medical: No    Non-medical: No  Tobacco Use  . Smoking status: Current Some Day Smoker    Packs/day: 0.12    Years: 46.00    Pack years: 5.52    Types: Cigarettes  . Smokeless tobacco: Never Used  . Tobacco comment: 1 cigarette a week  Substance and Sexual Activity  . Alcohol use: No  . Drug use: No  . Sexual activity: Not Currently  Lifestyle  . Physical activity    Days per week: 0 days    Minutes per session: 0 min  . Stress: Not at all  Relationships  . Social Herbalist on phone: Not on file    Gets together: Not on file    Attends religious service: Not on file    Active member of club or organization: Not on file    Attends meetings of clubs or organizations: Not on file    Relationship status: Not on file  . Intimate partner violence    Fear of current or ex partner: No    Emotionally abused: No    Physically abused: No    Forced sexual activity: No  Other Topics Concern  . Not on file  Social History Narrative  . Not on file     Review of Systems: General: negative for chills, fever, night sweats or weight changes.  Cardiovascular: negative for chest pain, dyspnea on exertion, edema, orthopnea, palpitations, paroxysmal nocturnal dyspnea or shortness of breath Dermatological: negative for rash Respiratory: negative for cough or wheezing Urologic: negative for hematuria Abdominal: negative for nausea, vomiting, diarrhea, bright red blood per rectum, melena, or hematemesis Neurologic: negative for visual changes, syncope, or dizziness All other systems reviewed and are otherwise negative except as noted above.    Blood pressure (!) 92/58, pulse 63,  temperature 97.7 F (36.5 C), height 5\' 6"  (1.676 m), weight 123 lb (55.8 kg).  General appearance: alert Neck: no adenopathy, no JVD, supple, symmetrical, trachea midline, thyroid not enlarged, symmetric, no tenderness/mass/nodules and Bilateral carotid bruits Lungs: clear to auscultation bilaterally Heart: regular rate and rhythm, S1, S2 normal, no murmur, click, rub or gallop Extremities: extremities normal, atraumatic, no cyanosis or edema Pulses: 2+ and symmetric Skin: Skin color, texture, turgor normal. No rashes or lesions Neurologic: Alert and oriented X 3, normal strength and tone. Normal symmetric reflexes. Normal coordination and gait  EKG not performed today  ASSESSMENT AND PLAN:   CAD (coronary artery disease) History of CAD status post right left heart cath by Dr. Algernon Hubbard 06/29/2018 revealing diffuse CAD without interventional targets.  Her CA was occluded at the origin with left-to-right collaterals.  She is on aspirin and Plavix.  Tobacco  abuse Discontinued  Cardiomyopathy, ischemic, ejection fraction 30-35%.  [place] History of nonischemic cardiomyopathy with an EF of 10 to 15% in the past most recently 25% by 2D echocardiogram performed 08/02/2018.  She is on Entresto, carvedilol, digoxin and Spironolactone.  Currently she is class II.  PAD (peripheral artery disease),  History of PAD moderately depressed ABIs but without claudication.  She had carotid endarterectomy performed November 2013.  Carotid Dopplers performed 06/28/2018 revealed a widely patent right carotid endarterectomy site with moderate left ICA stenosis.  Hyperlipidemia History of hyperlipidemia on high-dose Crestor and Zetia with lipid profile performed 06/21/2018 revealing total cholesterol 182, LDL 119 and HDL 43.  We will recheck a lipid liver profile.  Essential hypertension History of essential hypertension with blood pressure measured today at 92/58.  She is on carvedilol and  Entresto.  ICD (implantable cardioverter-defibrillator) in place History of ICD implantation for primary prevention.  She is not a candidate for CRT since she does not have the appropriate QRS morphology.  She has not had a discharge.      Andrea Harp MD FACP,FACC,FAHA, [facility] 12/12/2018 9:57 AM

## 2018-12-12 NOTE — Assessment & Plan Note (Signed)
History of ICD implantation for primary prevention.  She is not a candidate for CRT since she does not have the appropriate QRS morphology.  She has not had a discharge.

## 2018-12-12 NOTE — Patient Instructions (Signed)
Medication Instructions:  Your physician recommends that you continue on your current medications as directed. Please refer to the Current Medication list given to you today.  If you need a refill on your cardiac medications before your next appointment, please call your pharmacy.   Lab work: Your physician recommends that you have lab work done today: LIPID AND LIVER PANELS  If you have labs (blood work) drawn today and your tests are completely normal, you will receive your results only by: Marland Kitchen MyChart Message (if you have MyChart) OR . A paper copy in the mail If you have any lab test that is abnormal or we need to change your treatment, we will call you to review the results.  Testing/Procedures: Your physician has requested that you have a carotid duplex. This test is an ultrasound of the carotid arteries in your neck. It looks at blood flow through these arteries that supply the brain with blood. Allow one hour for this exam. There are no restrictions or special instructions. TO BE SCHEDULED FOR January 2021  Follow-Up: At [facility], you and your health needs are our priority.  As part of our continuing mission to provide you with exceptional heart care, we have created designated Provider Care Teams.  These Care Teams include your primary Cardiologist (physician) and Advanced Practice Providers (APPs -  Physician Assistants and Nurse Practitioners) who all work together to provide you with the care you need, when you need it. You will need a follow up appointment in 6 months WITH DR. Gwenlyn Found.  Please call our office 2 months in advance to schedule this appointment.

## 2018-12-12 NOTE — Assessment & Plan Note (Signed)
History of essential hypertension with blood pressure measured today at 92/58.  She is on carvedilol and Entresto.

## 2018-12-12 NOTE — Telephone Encounter (Signed)
-----   Message from Larey Dresser, MD sent at 12/11/2018 10:18 PM EDT ----- Good lipids.  Sodium is low, cut back on po fluid intake.

## 2018-12-12 NOTE — Assessment & Plan Note (Signed)
Discontinued

## 2018-12-12 NOTE — Assessment & Plan Note (Signed)
History of nonischemic cardiomyopathy with an EF of 10 to 15% in the past most recently 25% by 2D echocardiogram performed 08/02/2018.  She is on Entresto, carvedilol, digoxin and Spironolactone.  Currently she is class II.

## 2018-12-12 NOTE — Assessment & Plan Note (Signed)
History of hyperlipidemia on high-dose Crestor and Zetia with lipid profile performed 06/21/2018 revealing total cholesterol 182, LDL 119 and HDL 43.  We will recheck a lipid liver profile.

## 2018-12-16 ENCOUNTER — Other Ambulatory Visit: Payer: Self-pay | Admitting: Cardiology

## 2018-12-21 ENCOUNTER — Encounter (HOSPITAL_COMMUNITY): Payer: Self-pay | Admitting: Cardiology

## 2018-12-21 ENCOUNTER — Other Ambulatory Visit: Payer: Self-pay

## 2018-12-21 ENCOUNTER — Ambulatory Visit (HOSPITAL_COMMUNITY)
Admission: RE | Admit: 2018-12-21 | Discharge: 2018-12-21 | Disposition: A | Discharge status: Home / Self Care | Payer: Medicare Other | Source: Ambulatory Visit | Attending: Cardiology | Admitting: Cardiology

## 2018-12-21 VITALS — BP 106/56 | HR 68 | Wt 120.2 lb

## 2018-12-21 DIAGNOSIS — J449 Chronic obstructive pulmonary disease, unspecified: Secondary | ICD-10-CM | POA: Diagnosis not present

## 2018-12-21 DIAGNOSIS — I255 Ischemic cardiomyopathy: Secondary | ICD-10-CM | POA: Insufficient documentation

## 2018-12-21 DIAGNOSIS — I739 Peripheral vascular disease, unspecified: Secondary | ICD-10-CM

## 2018-12-21 DIAGNOSIS — Z79899 Other long term (current) drug therapy: Secondary | ICD-10-CM | POA: Diagnosis not present

## 2018-12-21 DIAGNOSIS — F17210 Nicotine dependence, cigarettes, uncomplicated: Secondary | ICD-10-CM | POA: Diagnosis not present

## 2018-12-21 DIAGNOSIS — I5022 Chronic systolic (congestive) heart failure: Secondary | ICD-10-CM | POA: Diagnosis not present

## 2018-12-21 DIAGNOSIS — I110 Hypertensive heart disease with heart failure: Secondary | ICD-10-CM | POA: Insufficient documentation

## 2018-12-21 DIAGNOSIS — R5383 Other fatigue: Secondary | ICD-10-CM | POA: Diagnosis present

## 2018-12-21 DIAGNOSIS — E785 Hyperlipidemia, unspecified: Secondary | ICD-10-CM | POA: Insufficient documentation

## 2018-12-21 DIAGNOSIS — Z7902 Long term (current) use of antithrombotics/antiplatelets: Secondary | ICD-10-CM | POA: Insufficient documentation

## 2018-12-21 DIAGNOSIS — I2510 Atherosclerotic heart disease of native coronary artery without angina pectoris: Secondary | ICD-10-CM | POA: Diagnosis not present

## 2018-12-21 DIAGNOSIS — Z7982 Long term (current) use of aspirin: Secondary | ICD-10-CM | POA: Insufficient documentation

## 2018-12-21 DIAGNOSIS — E039 Hypothyroidism, unspecified: Secondary | ICD-10-CM | POA: Insufficient documentation

## 2018-12-21 DIAGNOSIS — Z8249 Family history of ischemic heart disease and other diseases of the circulatory system: Secondary | ICD-10-CM | POA: Insufficient documentation

## 2018-12-21 NOTE — Progress Notes (Signed)
Declined AVS.  Pt verbalized understanding of instructions.

## 2018-12-21 NOTE — Patient Instructions (Signed)
Your physician recommends that you schedule a follow-up appointment in: PRN. Pt will call office if she has any needs.

## 2018-12-23 NOTE — Progress Notes (Signed)
Date:  12/23/2018   ID:  Andrea Hubbard, DOB Feb 20, 1946, MRN [id number]    Provider location: [facility] Type of Visit: Established patient   PCP:  Glendale Chard, MD  Cardiologist:  Dr. Aundra Dubin  Chief Complaint: Fatigue   History of Present Illness: Andrea Hubbard is a 73 y.o. female with a history of CAD, COPD, PAD, and ischemic cardiomyopathy with chronic systolic CHF. She was seen by Dr. Gwenlyn Found in the past and developed increased dyspnea in 12/19. Echo in 12/19 showed EF 10-15% with moderately reduced RV systolic function and moderate-severe AI.  She was then admitted in 1/20 with increased dyspnea and peripheral edema.  She was diuresed and right/left heart cath was done.  Cardiac index was low at 1.7.  She had significant CAD but there were no interventional targets.  Medications were adjusted and she was discharged home.   CPX in 1/20 was worrisome for poor short-term prognosis, showed moderate-severe HF limitation.  Repeat echo was done today and reviewed, showing EF 20-25%, inferior akinesis and septal severe hypokinesis, normal RV size with moderately decreased systolic function, moderate AI, mild TR. ABIs in 1/20 showed moderate PAD bilaterally.    She was seen in lipid and clinic and refused to start PCSK-9 inhibitor.  She was willing to add Zetia.    She is no longer smoking.   She returns today for followup of CHF, this will likely be her last visit as she will be moving to [place] this month to live near her daughter.  Orthostatic symptoms have resolved after cutting Entresto back to 49/51 bid.  No significant exertional dyspnea.  Able to walk up a flight of steps.  Fatigues easily with carrying boxes while moving to her new house.  No orthopnea/PND.  No chest pain.  No claudication.   ECG (5/20, personally reviewed): NSR, inferior MI, QRS 94 msec  Labs (12/19): LDL 119 Labs (1/20): digoxin 0.8, K 4.7, creatinine 1.19 Labs (2/20): K 4.6,  creatinine 0.94 Labs (3/20): K 4.9, creatinine 0.99, digoxin 0.6, hgb 13.7 Labs (4/20): digoxin 0.4, B12 normal Labs (5/20): K 4.6, creatinine 1.17, TSH mildly elevated 5.2 Labs (6/20): LDL 56, K 4.5, creatinine 1.0, digoxin 0.6  PMH: 1. COPD: Quit smoking 1/20.   - PFTs (1/20): FVC 63%, FEV1 54%, TLC 177%, DLCO 33%. Moderately severe emphysema.  2. Carotid stenosis: s/p right CEA in 2010.  - Carotid dopplers (2/70): 62-37% LICA stenosis, s/p R CEA.  3. PAD: s/p PCI to right CIA and left CIA in 2015.  Known right SFA occlusion.  - ABIs 0.63 right, 0.69 left (1/20).  4. CAD: MI with PCI in 2010 in [place].  - 1/20 LHC: 40% pLAD, 95% stenosis proximal small-moderate D1, small-moderate D2 70% ostial stenosis, small D3 99% proximal stenosis, 60-70% ostial stenosis small-moderate OM1, patent stent in OM2, occluded RCA at ostium with left => right collaterals.  5. Chronic systolic CHF: Ischemic cardiomyopathy.  [place].  - Echo (12/19): EF 10-15% with mild LV dilation, diffuse hypokinesis, moderate-severe AI, mild MR, PASP 61 mmHg, moderately reduced RV systolic function, severe TR.  - RHC (1/20): mean RA 6, PA 40/16 mean 25, mean PCWP 12, CI 1.7, PVR 4.6 WU. PAPi 4.  - CPX (1/20): peak VO2 14.4, VE/VCO2 slope 57, RER 1.03 => moderate-severe HF limitation with concern for poor short term prognosis.  - Echo (2/20): EF 20-25%, inferior akinesis and septal severe hypokinesis, normal RV size with moderately decreased  systolic function, moderate AI, mild TR. 6. HTN 7. Hypothyroidism 8. Hyperlipidemia  Current Outpatient Medications  Medication Sig Dispense Refill   acetaminophen (TYLENOL) 325 MG tablet Take 2 tablets (650 mg total) by mouth every 4 (four) hours as needed for headache or mild pain.     aspirin EC 81 MG tablet Take 81 mg by mouth daily.     carvedilol (COREG) 6.25 MG tablet Take 1 tablet (6.25 mg total) by mouth 2 (two) times daily with a meal. 180 tablet 3    clopidogrel (PLAVIX) 75 MG tablet TAKE 1 TABLET BY MOUTH EVERY DAY 90 tablet 3   digoxin (LANOXIN) 0.125 MG tablet Take 0.5 tablets (0.0625 mg total) by mouth daily. 90 tablet 3   ezetimibe (ZETIA) 10 MG tablet Take 1 tablet (10 mg total) by mouth daily. 90 tablet 3   levothyroxine (SYNTHROID) 25 MCG tablet TAKE 1 TABLET (25 MCG TOTAL) BY MOUTH DAILY BEFORE BREAKFAST. 90 tablet 0   rosuvastatin (CRESTOR) 40 MG tablet Take 1 tablet (40 mg total) by mouth daily. 90 tablet 3   sacubitril-valsartan (ENTRESTO) 49-51 MG Take 1 tablet by mouth 2 (two) times daily. 180 tablet 1   spironolactone (ALDACTONE) 25 MG tablet TAKE 1 TABLET BY MOUTH EVERYDAY AT BEDTIME 90 tablet 1   No current facility-administered medications for this encounter.     Allergies:   Morphine and related   Social History:  The patient  reports that she has been smoking cigarettes. She has a 5.52 pack-year smoking history. She has never used smokeless tobacco. She reports that she does not drink alcohol or use drugs.   Family History:  The patient's family history includes Diabetes in her maternal grandmother; Hypertension in her son; Leukemia in her mother; Pancreatic cancer in her father; Rheum arthritis in her sister; Stroke in her paternal grandfather; Sudden death in her maternal grandfather.   ROS:  Please see the history of present illness.   All other systems are personally reviewed and negative.   Exam:   BP (!) 106/56    Pulse 68    Wt 54.5 kg (120 lb 3.2 oz)    SpO2 98%    BMI 19.40 kg/m  General: NAD Neck: No JVD, no thyromegaly or thyroid nodule.  Lungs: Clear to auscultation bilaterally with normal respiratory effort. CV: Nondisplaced PMI.  Heart regular S1/S2, no S3/S4, no murmur.  No peripheral edema.  No carotid bruit.  Difficult to palpate pedal pulses.  Abdomen: Soft, nontender, no hepatosplenomegaly, no distention.  Skin: Intact without lesions or rashes.  Neurologic: Alert and oriented x 3.  Psych:  Normal affect. Extremities: No clubbing or cyanosis.  HEENT: Normal.    Recent Labs: 06/27/2018: B Natriuretic Peptide 954.2 06/30/2018: Magnesium 2.1 08/30/2018: Hemoglobin 13.7; Platelets 210 12/10/2018: TSH 9.010 12/11/2018: BUN 18; Creatinine, Ser 1.00; Potassium 4.5; Sodium 130 12/12/2018: ALT 67  Personally reviewed   Wt Readings from Last 3 Encounters:  12/21/18 54.5 kg (120 lb 3.2 oz)  12/12/18 55.8 kg (123 lb)  11/12/18 55.3 kg (122 lb)      ASSESSMENT AND PLAN:  1.  Chronic systolic CHF: Ischemic cardiomyopathy.  Echo in 1/20 with EF 10-15%, moderate RV systolic dysfunction.  Repeat echo in 2/20 with EF 20-25%, normal RV size with moderate systolic dysfunction.  [place] ICD, narrow QRS so not candidate for CRT.  CPX in 1/20 with moderate-severe HF limitation and concern for poor short-term prognosis.  RHC in 1/20 with low cardiac index, 1.7.  She has been doing better symptomatically recently, now NYHA class II.  She is not volume overloaded on exam.  Orthostatic symptoms resolved after decreasing Entresto back to 49/51 bid, will not titrate meds today.  Recent BMET with stable K and creatinine.  - Continue Coreg 6.25 mg bid.  - Continue digoxin 0.0625 mg daily, good level on recent labs.   - Continue Entresto 49/51 bid.     - Continue spironolactone 25 mg daily. - She can stay off torsemide.  - Given low output HF by RHC and suggestion of poor short-term prognosis on CPX, we have been discussing LVAD. Limitations for LVAD include moderate-severe COPD and moderate RV dysfunction. PAPi on RHC was 4, suggesting that RV function would probably be reasonable for LVAD.  She has seen Dr. Prescott Gum. She has actually been doing considerably better recently and symptoms are now NYHA class II.  I do not think we have to rush to LVAD at this point.  Physiologically, she seems younger than her 40 years and expresses interest in evaluation for transplantation.  She is going to be moving to  [place] soon. I am going to put her in touch with Dr. Artemio Aly at [facility] clinic for transplant evaluation and further followup after she moves to [place].     2. CAD: Significant CAD on 1/20 cath but no interventional targets.  No chest pain.  - Continue ASA 81, Crestor 40 daily, and Zetia 10 mg daily.  3. Aortic insufficiency: Moderate-severe on 1/20 echo, appeared moderate on 2/20 echo.  If she gets an LVAD, this may need to be addressed surgically.  4. Tricuspid regurgitation: Severe on 1/20 echo but with effective diuresis, appeared only mild on 2/20 echo.   5. PAD: No claudication but bilateral ABIs around 0.6, signifying significant PAD.   - She has quit smoking.  - Continue statin and aspirin.  6. Carotid stenosis: Repeat carotid dopplers in 1/21.  7. COPD: She recently quit smoking.  Moderate-severe COPD on PFTs in 1/20.  8. Hyperlipidemia: Repatha was recommended but she did not want to take this medication.  Instead, she is now on Crestor + Zetia.  Most recent lipids in 6/20 with LDL 56, this is excellent.   Recommended follow-up:  Prn in this office, she will be seen in the heart failure clinic at [facility] after moving to [place].     Signed, Loralie Champagne, MD  12/23/2018  [place] [address] [facility] and [place] [address] [phone] (office) [phone] (fax)

## 2019-01-22 ENCOUNTER — Telehealth (HOSPITAL_COMMUNITY): Payer: Self-pay | Admitting: *Deleted

## 2019-01-22 NOTE — Telephone Encounter (Signed)
Referral faxed to Dr.Frank at [facility] failure clinic in [place].

## 2019-01-23 ENCOUNTER — Ambulatory Visit: Payer: Medicare Other | Admitting: Cardiovascular Disease

## 2019-02-11 ENCOUNTER — Ambulatory Visit (INDEPENDENT_AMBULATORY_CARE_PROVIDER_SITE_OTHER): Payer: Medicare Other | Admitting: *Deleted

## 2019-02-11 DIAGNOSIS — I255 Ischemic cardiomyopathy: Secondary | ICD-10-CM

## 2019-02-12 ENCOUNTER — Telehealth: Payer: Self-pay

## 2019-02-12 LAB — CUP PACEART REMOTE DEVICE CHECK
Battery Remaining Longevity: 60 mo
Battery Remaining Percentage: 62 %
Brady Statistic RV Percent Paced: 0 %
Date Time Interrogation Session: 20200818155639
HighPow Impedance: 52 Ohm
Implantable Lead Implant Date: 20111111
Implantable Lead Location: 753860
Implantable Lead Model: 185
Implantable Lead Serial Number: 12345
Implantable Pulse Generator Implant Date: 20111111
Lead Channel Impedance Value: 380 Ohm
Lead Channel Pacing Threshold Amplitude: 0.6 V
Lead Channel Pacing Threshold Pulse Width: 0.5 ms
Lead Channel Setting Pacing Amplitude: 2.4 V
Lead Channel Setting Pacing Pulse Width: 0.5 ms
Lead Channel Setting Sensing Sensitivity: 0.3 mV
Pulse Gen Serial Number: 275874

## 2019-02-12 NOTE — Telephone Encounter (Signed)
Left message for patient to remind of missed remote transmission.  

## 2019-02-13 ENCOUNTER — Ambulatory Visit: Payer: Medicare Other | Admitting: Nurse Practitioner

## 2019-02-15 DIAGNOSIS — I255 Ischemic cardiomyopathy: Secondary | ICD-10-CM | POA: Diagnosis not present

## 2019-02-15 DIAGNOSIS — I509 Heart failure, unspecified: Secondary | ICD-10-CM | POA: Diagnosis not present

## 2019-02-19 ENCOUNTER — Encounter: Payer: Self-pay | Admitting: Cardiology

## 2019-02-19 NOTE — Progress Notes (Signed)
Remote ICD transmission.   

## 2019-03-10 ENCOUNTER — Other Ambulatory Visit: Payer: Self-pay | Admitting: Nurse Practitioner

## 2019-03-10 DIAGNOSIS — E039 Hypothyroidism, unspecified: Secondary | ICD-10-CM

## 2019-03-12 ENCOUNTER — Telehealth: Payer: Self-pay | Admitting: Internal Medicine

## 2019-03-12 NOTE — Chronic Care Management (AMB) (Signed)
Chronic Care Management   Note  03/12/2019 Name: Andrea Hubbard MRN: [id number] DOB: September 26, 1945  [name] is a 73 y.o. year old female who is a primary care patient of Glendale Chard, MD. I reached out to Princess Perna by phone today in response to a referral sent by Ms. [facility] health plan.    [name] was given information about Chronic Care Management services today including:  1. CCM service includes personalized support from designated clinical staff supervised by her physician, including individualized plan of care and coordination with other care providers 2. 24/7 contact phone numbers for assistance for urgent and routine care needs. 3. Service will only be billed when office clinical staff spend 20 minutes or more in a month to coordinate care. 4. Only one practitioner may furnish and bill the service in a calendar month. 5. The patient may stop CCM services at any time (effective at the end of the month) by phone call to the office staff. 6. The patient will be responsible for cost sharing (co-pay) of up to 20% of the service fee (after annual deductible is met).  Patient did not agree to enrollment in care management services and does not wish to consider at this time. Because patient has relocated to [place].  Follow up plan:  No further follow up needed.    [place]  ??[email or web address]   ??[phone]

## 2019-03-18 DIAGNOSIS — I255 Ischemic cardiomyopathy: Secondary | ICD-10-CM | POA: Diagnosis not present

## 2019-03-18 DIAGNOSIS — I6523 Occlusion and stenosis of bilateral carotid arteries: Secondary | ICD-10-CM | POA: Diagnosis not present

## 2019-03-18 DIAGNOSIS — R0989 Other specified symptoms and signs involving the circulatory and respiratory systems: Secondary | ICD-10-CM | POA: Diagnosis not present

## 2019-03-21 DIAGNOSIS — I2510 Atherosclerotic heart disease of native coronary artery without angina pectoris: Secondary | ICD-10-CM | POA: Diagnosis not present

## 2019-03-21 DIAGNOSIS — I739 Peripheral vascular disease, unspecified: Secondary | ICD-10-CM | POA: Diagnosis not present

## 2019-03-21 DIAGNOSIS — J449 Chronic obstructive pulmonary disease, unspecified: Secondary | ICD-10-CM | POA: Diagnosis not present

## 2019-03-21 DIAGNOSIS — I5041 Acute combined systolic (congestive) and diastolic (congestive) heart failure: Secondary | ICD-10-CM | POA: Diagnosis not present

## 2019-03-21 DIAGNOSIS — E039 Hypothyroidism, unspecified: Secondary | ICD-10-CM | POA: Diagnosis not present

## 2019-03-31 ENCOUNTER — Other Ambulatory Visit: Payer: Self-pay | Admitting: Cardiovascular Disease

## 2019-04-01 DIAGNOSIS — I739 Peripheral vascular disease, unspecified: Secondary | ICD-10-CM | POA: Diagnosis not present

## 2019-04-01 DIAGNOSIS — I255 Ischemic cardiomyopathy: Secondary | ICD-10-CM | POA: Diagnosis not present

## 2019-04-01 DIAGNOSIS — G8929 Other chronic pain: Secondary | ICD-10-CM | POA: Diagnosis not present

## 2019-04-01 DIAGNOSIS — Z23 Encounter for immunization: Secondary | ICD-10-CM | POA: Diagnosis not present

## 2019-04-01 DIAGNOSIS — Z1231 Encounter for screening mammogram for malignant neoplasm of breast: Secondary | ICD-10-CM | POA: Diagnosis not present

## 2019-04-01 DIAGNOSIS — E785 Hyperlipidemia, unspecified: Secondary | ICD-10-CM | POA: Diagnosis not present

## 2019-04-01 DIAGNOSIS — I2510 Atherosclerotic heart disease of native coronary artery without angina pectoris: Secondary | ICD-10-CM | POA: Diagnosis not present

## 2019-04-01 DIAGNOSIS — R7303 Prediabetes: Secondary | ICD-10-CM | POA: Diagnosis not present

## 2019-04-01 DIAGNOSIS — M545 Low back pain: Secondary | ICD-10-CM | POA: Diagnosis not present

## 2019-04-01 DIAGNOSIS — Z95810 Presence of automatic (implantable) cardiac defibrillator: Secondary | ICD-10-CM | POA: Diagnosis not present

## 2019-04-01 DIAGNOSIS — E039 Hypothyroidism, unspecified: Secondary | ICD-10-CM | POA: Diagnosis not present

## 2019-05-02 DIAGNOSIS — I255 Ischemic cardiomyopathy: Secondary | ICD-10-CM | POA: Diagnosis not present

## 2019-05-02 DIAGNOSIS — I739 Peripheral vascular disease, unspecified: Secondary | ICD-10-CM | POA: Diagnosis not present

## 2019-05-07 DIAGNOSIS — Z1231 Encounter for screening mammogram for malignant neoplasm of breast: Secondary | ICD-10-CM | POA: Diagnosis not present

## 2019-05-13 ENCOUNTER — Ambulatory Visit (INDEPENDENT_AMBULATORY_CARE_PROVIDER_SITE_OTHER): Payer: Medicare Other | Admitting: *Deleted

## 2019-05-13 DIAGNOSIS — I5022 Chronic systolic (congestive) heart failure: Secondary | ICD-10-CM | POA: Diagnosis not present

## 2019-05-14 LAB — CUP PACEART REMOTE DEVICE CHECK
Battery Remaining Longevity: 54 mo
Battery Remaining Percentage: 58 %
Brady Statistic RV Percent Paced: 0 %
Date Time Interrogation Session: 20201116123400
HighPow Impedance: 50 Ohm
Implantable Lead Implant Date: 20111111
Implantable Lead Location: 753860
Implantable Lead Model: 185
Implantable Lead Serial Number: 12345
Implantable Pulse Generator Implant Date: 20111111
Lead Channel Impedance Value: 361 Ohm
Lead Channel Pacing Threshold Amplitude: 0.6 V
Lead Channel Pacing Threshold Pulse Width: 0.5 ms
Lead Channel Setting Pacing Amplitude: 2.4 V
Lead Channel Setting Pacing Pulse Width: 0.5 ms
Lead Channel Setting Sensing Sensitivity: 0.3 mV
Pulse Gen Serial Number: 275874

## 2019-06-07 NOTE — Progress Notes (Signed)
Remote ICD transmission.   

## 2019-06-12 ENCOUNTER — Other Ambulatory Visit: Payer: Self-pay | Admitting: Cardiology

## 2019-07-03 ENCOUNTER — Encounter (HOSPITAL_COMMUNITY): Payer: Medicare Other

## 2019-07-03 ENCOUNTER — Other Ambulatory Visit (HOSPITAL_COMMUNITY): Payer: Self-pay | Admitting: Cardiology

## 2019-07-06 ENCOUNTER — Other Ambulatory Visit: Payer: Self-pay | Admitting: Cardiovascular Disease

## 2019-07-22 DIAGNOSIS — Z23 Encounter for immunization: Secondary | ICD-10-CM | POA: Diagnosis not present

## 2019-07-25 ENCOUNTER — Other Ambulatory Visit: Payer: Self-pay | Admitting: Cardiovascular Disease

## 2019-07-25 ENCOUNTER — Other Ambulatory Visit: Payer: Self-pay | Admitting: Internal Medicine

## 2019-07-25 DIAGNOSIS — I255 Ischemic cardiomyopathy: Secondary | ICD-10-CM

## 2019-07-25 DIAGNOSIS — I5022 Chronic systolic (congestive) heart failure: Secondary | ICD-10-CM

## 2019-08-12 DIAGNOSIS — Z23 Encounter for immunization: Secondary | ICD-10-CM | POA: Diagnosis not present

## 2019-08-25 ENCOUNTER — Other Ambulatory Visit: Payer: Self-pay | Admitting: Cardiovascular Disease

## 2019-09-06 DIAGNOSIS — I255 Ischemic cardiomyopathy: Secondary | ICD-10-CM | POA: Diagnosis not present

## 2019-09-18 DIAGNOSIS — I428 Other cardiomyopathies: Secondary | ICD-10-CM | POA: Diagnosis not present

## 2019-09-19 DIAGNOSIS — I428 Other cardiomyopathies: Secondary | ICD-10-CM | POA: Diagnosis not present

## 2019-09-20 IMAGING — DX DG CHEST 2V
2 series · 2 of 2 positions shown · non-contrast
Comparison: CT 06/27/2018, radiograph 12/23/2013

CLINICAL DATA: Swollen extremities, short of breath

EXAM:
CHEST - 2 VIEW

[x chest ap]
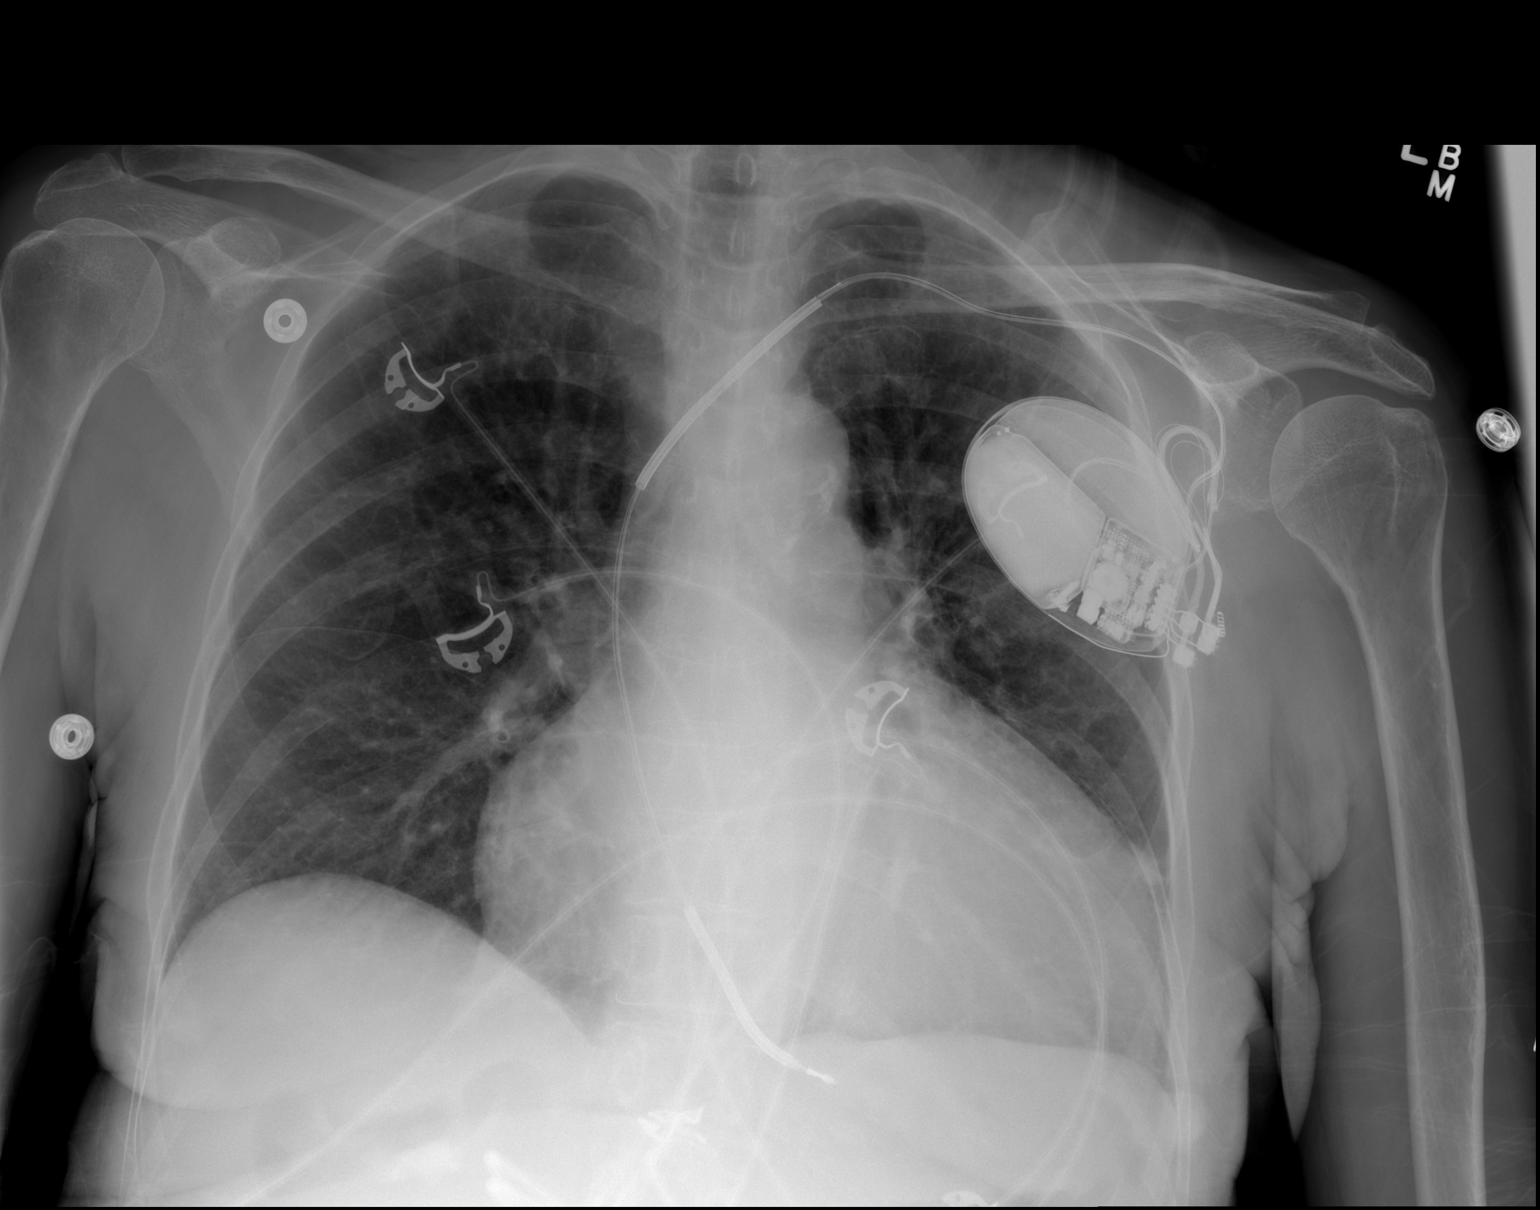

[w chest lat]
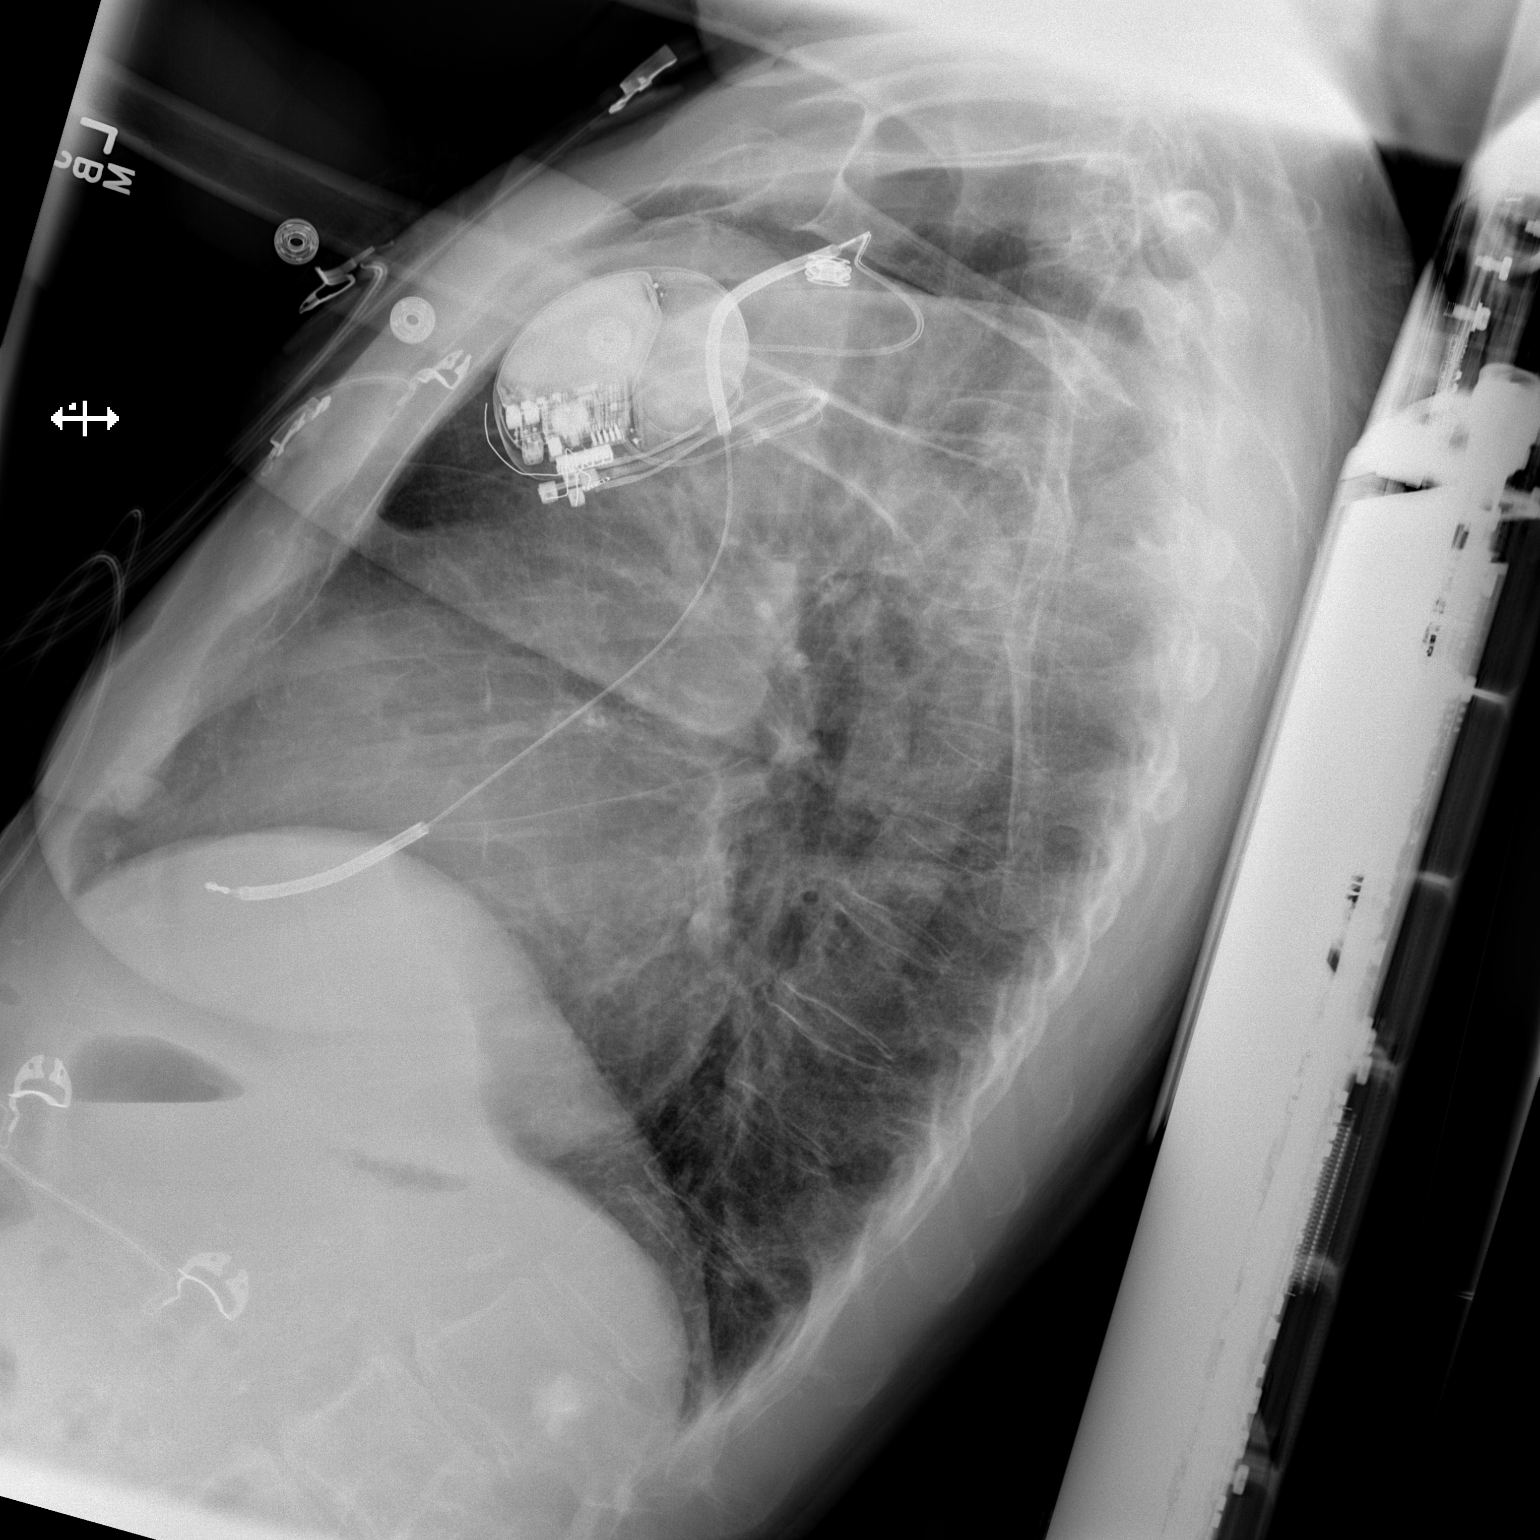

[2 of 2 positions shown; findings below may reference images not displayed]

FINDINGS: Left-sided pacing device as before. Interval moderate cardiomegaly
without edema. No pleural effusion or focal airspace disease. Aortic
atherosclerosis.
IMPRESSION: Interval moderate cardiomegaly without edema or infiltrate

## 2019-09-22 IMAGING — CT CT ABD-PELV W/O CM
2 of 4 series · 13 of 36 positions shown, 16 images · non-contrast
Comparison: CT chest 06/27/2018

CLINICAL DATA: Assess for ventricular assist device candidacy

EXAM:
CT CHEST, ABDOMEN AND PELVIS WITHOUT CONTRAST
TECHNIQUE: Multidetector CT imaging of the chest, abdomen and pelvis was
performed following the standard protocol without IV contrast.

[Series 3: cap wo 5.0 i31f 2 · axial · 0.72mm/px · z∈[+798,+1323]mm · 10 of 127 slices shown, 13 images]
[im 11/127  mediastinal]
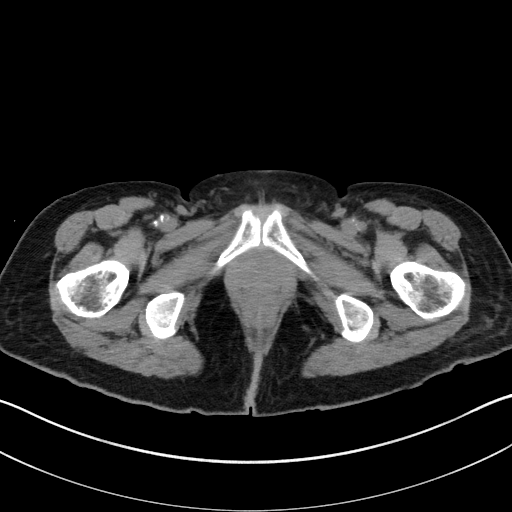
[im 11/127  lung]
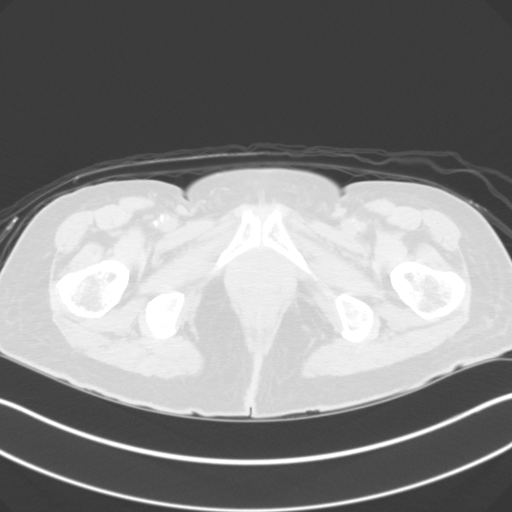
[im 22/127  lung]
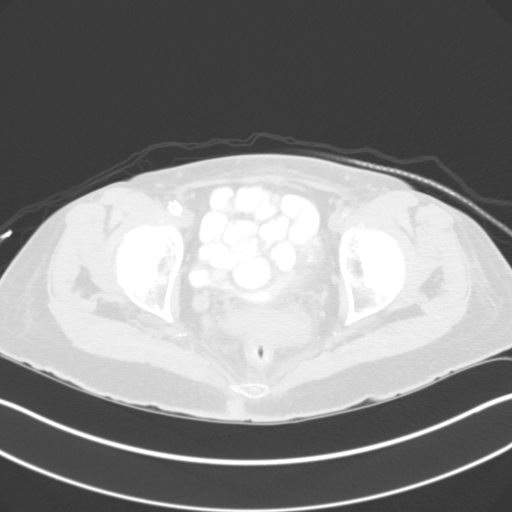
[im 32/127  lung]
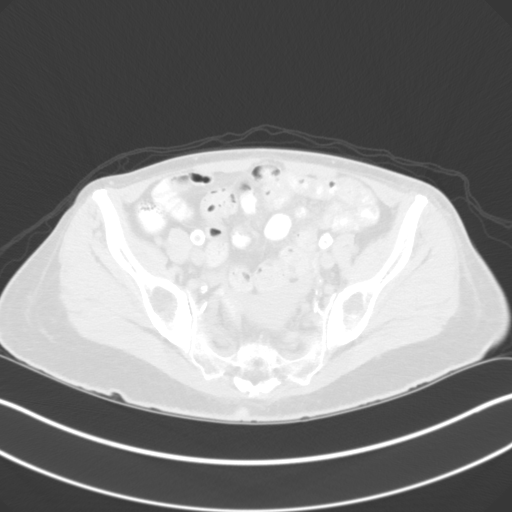
[im 43/127  lung]
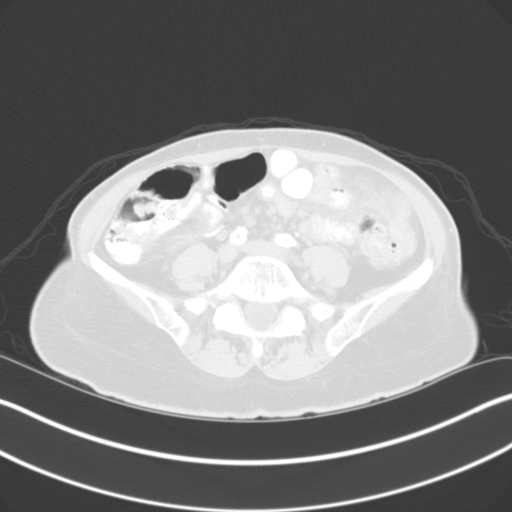
[im 53/127  mediastinal]
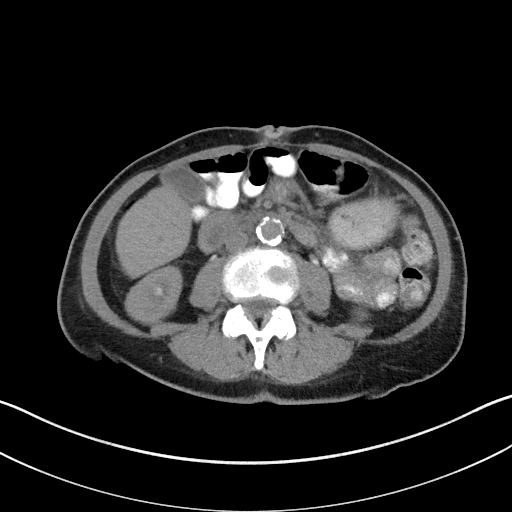
[im 53/127  lung]
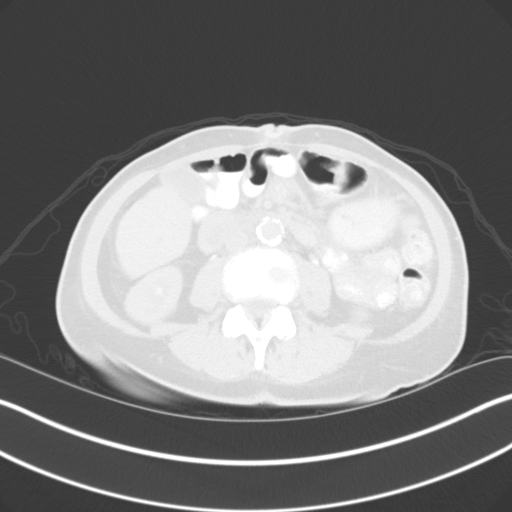
[im 74/127  lung]
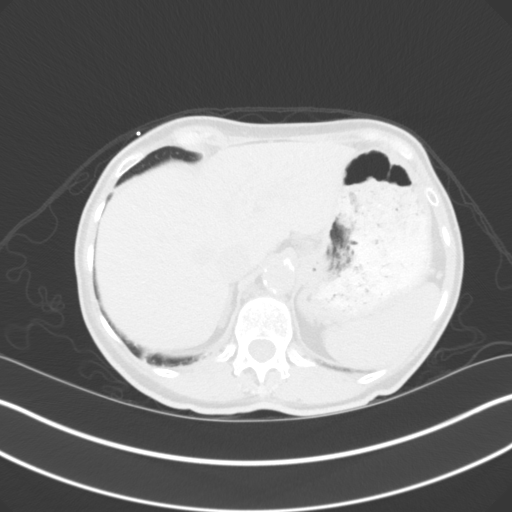
[im 85/127  lung]
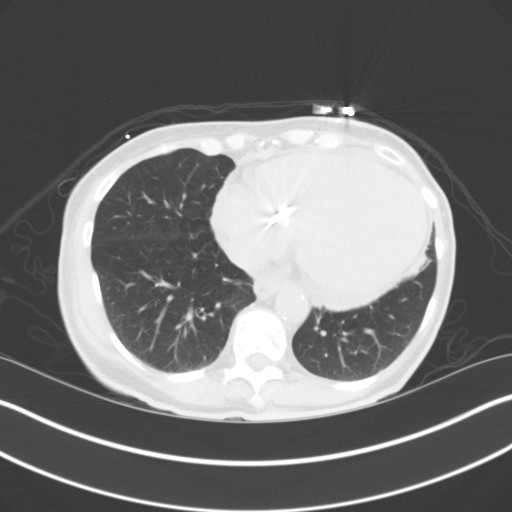
[im 95/127  lung]
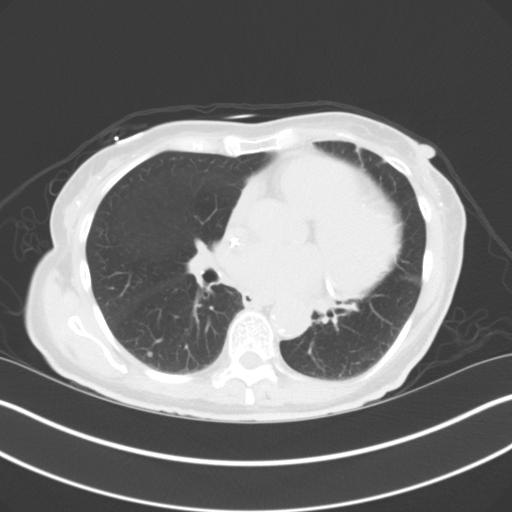
[im 106/127  mediastinal]
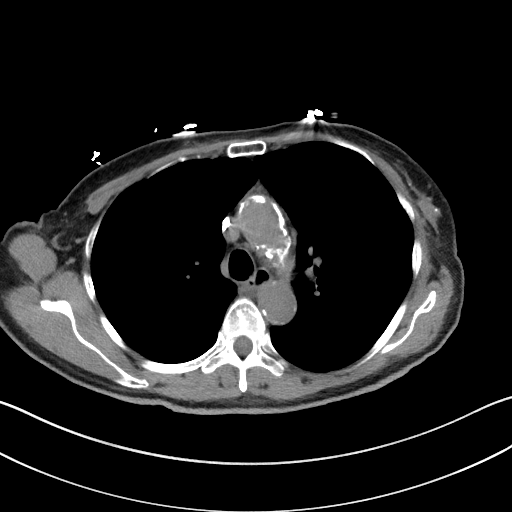
[im 106/127  lung]
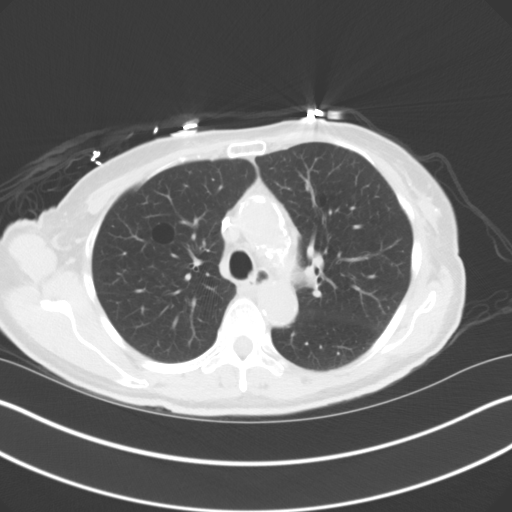
[im 116/127  lung]
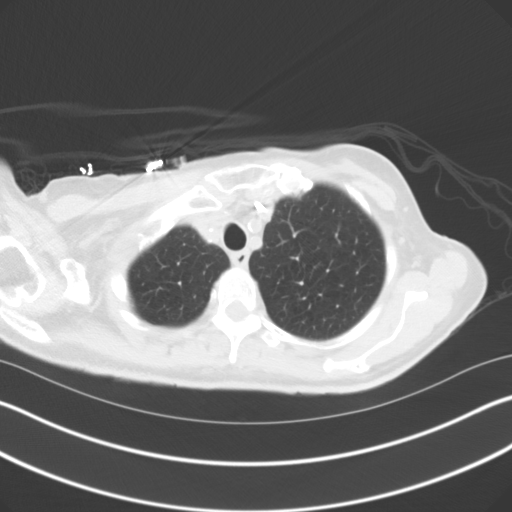

[Series 6: coronal · coronal · 0.67mm/px · 3 of 129 slices shown]
[im 26/129  lung]
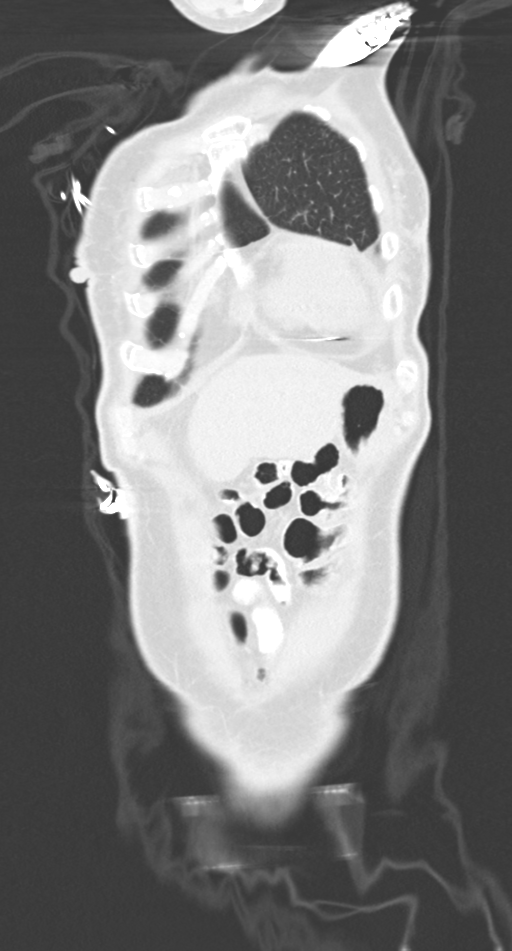
[im 52/129  lung]
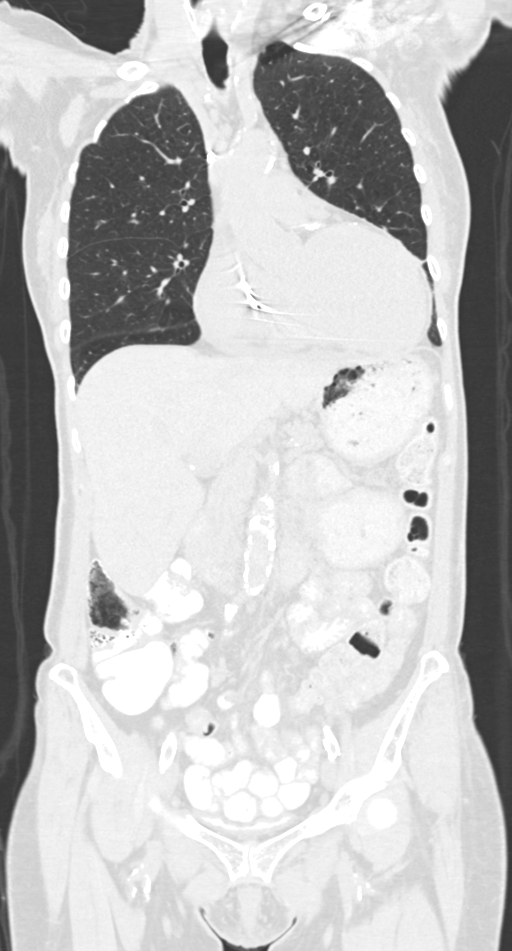
[im 77/129  lung]
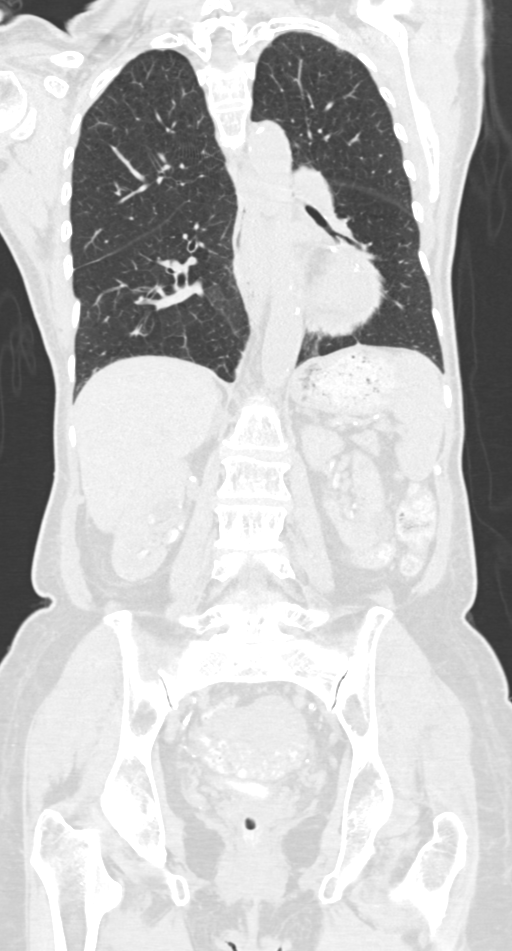

[13 of 36 positions shown; findings below may reference images not displayed]

FINDINGS: CT CHEST FINDINGS

Cardiovascular: Cardiomegaly with multi chamber enlargement.
Pacemaker/AICD inserted from a left subclavian approach with its tip
in the region of the right ventricle. Small amount of pericardial
fluid. Extensive coronary artery calcification. Aortic
atherosclerosis without aneurysm. Pulmonary arteries are not
enlarged.

Mediastinum/Nodes: No mass or lymphadenopathy.

Lungs/Pleura: Background emphysema pattern without dominant bullous
disease. No evidence of infiltrate or collapse. No mass or nodule in
need of follow-up. No pleural fluid.

Musculoskeletal: Minor inferior endplate fracture at T10.

CT ABDOMEN PELVIS FINDINGS

Hepatobiliary: Liver appears normal without contrast. No calcified
gallstones.

Pancreas: Normal

Spleen: Normal

Adrenals/Urinary Tract: Adrenal glands are normal. 1.5 cm cyst at
the lower pole of the left kidney. Small amount of residual contrast
in both renal collecting systems. 1.5 cm cyst in the right renal
hilar region.

Stomach/Bowel: No primary bowel pathology or significant finding.

Vascular/Lymphatic: Aortic atherosclerosis. Bilateral iliac region
stents. IVC is normal. No retroperitoneal adenopathy.

Reproductive: No pelvic mass.

Other: No free fluid or air.

Musculoskeletal: Ordinary lumbar degenerative changes.
IMPRESSION: Cardiac enlargement with multi chamber enlargement. Extensive
coronary artery calcification. Aortic atherosclerosis.

Background emphysema without dominant bullous disease. No active
pulmonary finding.

No active abdominal or pelvic finding or obvious noncardiac
contraindication for ventricular assist device.

Aortic Atherosclerosis (CCG81-2QE.E) and Emphysema ([id number].F).

## 2019-10-20 ENCOUNTER — Other Ambulatory Visit: Payer: Self-pay | Admitting: Cardiology

## 2019-11-13 ENCOUNTER — Ambulatory Visit: Payer: Medicare Other | Admitting: Nurse Practitioner

## 2019-11-13 ENCOUNTER — Ambulatory Visit: Payer: Medicare Other

## 2019-12-09 ENCOUNTER — Other Ambulatory Visit: Payer: Self-pay | Admitting: Cardiovascular Disease

## 2020-01-07 DIAGNOSIS — I428 Other cardiomyopathies: Secondary | ICD-10-CM | POA: Diagnosis not present

## 2020-01-15 DIAGNOSIS — I428 Other cardiomyopathies: Secondary | ICD-10-CM | POA: Diagnosis not present

## 2020-01-23 ENCOUNTER — Telehealth: Payer: Self-pay | Admitting: Cardiovascular Disease

## 2020-01-23 NOTE — Telephone Encounter (Signed)
LVM for patient to return call to be scheduled for follow up with [facility] from recall list

## 2020-02-06 DIAGNOSIS — R931 Abnormal findings on diagnostic imaging of heart and coronary circulation: Secondary | ICD-10-CM | POA: Diagnosis not present

## 2020-02-06 DIAGNOSIS — I359 Nonrheumatic aortic valve disorder, unspecified: Secondary | ICD-10-CM | POA: Diagnosis not present

## 2020-02-06 DIAGNOSIS — I255 Ischemic cardiomyopathy: Secondary | ICD-10-CM | POA: Diagnosis not present

## 2020-02-06 DIAGNOSIS — I739 Peripheral vascular disease, unspecified: Secondary | ICD-10-CM | POA: Diagnosis not present

## 2020-02-06 DIAGNOSIS — I517 Cardiomegaly: Secondary | ICD-10-CM | POA: Diagnosis not present

## 2020-02-06 DIAGNOSIS — I369 Nonrheumatic tricuspid valve disorder, unspecified: Secondary | ICD-10-CM | POA: Diagnosis not present

## 2020-02-06 DIAGNOSIS — I351 Nonrheumatic aortic (valve) insufficiency: Secondary | ICD-10-CM | POA: Diagnosis not present

## 2020-04-06 DIAGNOSIS — Z23 Encounter for immunization: Secondary | ICD-10-CM | POA: Diagnosis not present

## 2020-04-07 DIAGNOSIS — I428 Other cardiomyopathies: Secondary | ICD-10-CM | POA: Diagnosis not present

## 2020-04-20 DIAGNOSIS — Z23 Encounter for immunization: Secondary | ICD-10-CM | POA: Diagnosis not present

## 2020-05-07 DIAGNOSIS — Z1231 Encounter for screening mammogram for malignant neoplasm of breast: Secondary | ICD-10-CM | POA: Diagnosis not present

## 2020-06-11 DIAGNOSIS — I255 Ischemic cardiomyopathy: Secondary | ICD-10-CM | POA: Diagnosis not present

## 2020-06-11 DIAGNOSIS — I739 Peripheral vascular disease, unspecified: Secondary | ICD-10-CM | POA: Diagnosis not present

## 2020-06-12 DIAGNOSIS — I255 Ischemic cardiomyopathy: Secondary | ICD-10-CM | POA: Diagnosis not present

## 2020-06-24 DIAGNOSIS — I255 Ischemic cardiomyopathy: Secondary | ICD-10-CM | POA: Diagnosis not present

## 2020-06-26 DIAGNOSIS — Z743 Need for continuous supervision: Secondary | ICD-10-CM | POA: Diagnosis not present

## 2020-06-26 DIAGNOSIS — N179 Acute kidney failure, unspecified: Secondary | ICD-10-CM | POA: Diagnosis not present

## 2020-06-26 DIAGNOSIS — D649 Anemia, unspecified: Secondary | ICD-10-CM | POA: Diagnosis not present

## 2020-06-26 DIAGNOSIS — R748 Abnormal levels of other serum enzymes: Secondary | ICD-10-CM | POA: Diagnosis not present

## 2020-06-26 DIAGNOSIS — R079 Chest pain, unspecified: Secondary | ICD-10-CM | POA: Diagnosis not present

## 2020-06-26 DIAGNOSIS — R9431 Abnormal electrocardiogram [ECG] [EKG]: Secondary | ICD-10-CM | POA: Diagnosis not present

## 2020-06-26 DIAGNOSIS — I255 Ischemic cardiomyopathy: Secondary | ICD-10-CM | POA: Diagnosis not present

## 2020-08-06 ENCOUNTER — Other Ambulatory Visit: Payer: Self-pay | Admitting: Cardiology

## 2020-08-13 ENCOUNTER — Other Ambulatory Visit: Payer: Self-pay | Admitting: Cardiovascular Disease

## 2020-09-18 ENCOUNTER — Other Ambulatory Visit: Payer: Self-pay | Admitting: Cardiology

## 2020-09-18 ENCOUNTER — Other Ambulatory Visit: Payer: Self-pay | Admitting: Cardiovascular Disease

## 2020-10-20 ENCOUNTER — Other Ambulatory Visit: Payer: Self-pay | Admitting: Cardiovascular Disease

## 2020-11-07 ENCOUNTER — Other Ambulatory Visit: Payer: Self-pay | Admitting: Cardiovascular Disease

## 2020-12-09 ENCOUNTER — Other Ambulatory Visit: Payer: Self-pay | Admitting: Cardiovascular Disease

## 2020-12-31 ENCOUNTER — Other Ambulatory Visit: Payer: Self-pay | Admitting: Cardiovascular Disease

## 2021-01-06 ENCOUNTER — Other Ambulatory Visit: Payer: Self-pay | Admitting: Cardiovascular Disease

## 2021-01-31 ENCOUNTER — Other Ambulatory Visit: Payer: Self-pay | Admitting: Cardiovascular Disease

## 2021-02-22 ENCOUNTER — Other Ambulatory Visit: Payer: Self-pay | Admitting: Cardiovascular Disease

## 2021-10-13 ENCOUNTER — Other Ambulatory Visit: Payer: Self-pay | Admitting: Cardiovascular Disease

## 2023-08-26 DEATH — deceased
